# Patient Record
Sex: Male | Born: 1945 | Race: White | Hispanic: No | Marital: Married | Smoking: Current some day smoker
Health system: Southern US, Community
[De-identification: ages and names within clinical notes are randomized; demographics above are authoritative.]

## PROBLEM LIST (undated history)

## (undated) DIAGNOSIS — E119 Type 2 diabetes mellitus without complications: Secondary | ICD-10-CM

## (undated) DIAGNOSIS — Z7982 Long term (current) use of aspirin: Secondary | ICD-10-CM

## (undated) DIAGNOSIS — I38 Endocarditis, valve unspecified: Secondary | ICD-10-CM

## (undated) DIAGNOSIS — M1990 Unspecified osteoarthritis, unspecified site: Secondary | ICD-10-CM

## (undated) DIAGNOSIS — I700 Atherosclerosis of aorta: Secondary | ICD-10-CM

## (undated) DIAGNOSIS — I5020 Unspecified systolic (congestive) heart failure: Secondary | ICD-10-CM

## (undated) DIAGNOSIS — I77810 Thoracic aortic ectasia: Secondary | ICD-10-CM

## (undated) DIAGNOSIS — I509 Heart failure, unspecified: Secondary | ICD-10-CM

## (undated) DIAGNOSIS — K5790 Diverticulosis of intestine, part unspecified, without perforation or abscess without bleeding: Secondary | ICD-10-CM

## (undated) DIAGNOSIS — Z7901 Long term (current) use of anticoagulants: Secondary | ICD-10-CM

## (undated) DIAGNOSIS — I429 Cardiomyopathy, unspecified: Secondary | ICD-10-CM

## (undated) DIAGNOSIS — Z8719 Personal history of other diseases of the digestive system: Secondary | ICD-10-CM

## (undated) DIAGNOSIS — E559 Vitamin D deficiency, unspecified: Secondary | ICD-10-CM

## (undated) DIAGNOSIS — I4891 Unspecified atrial fibrillation: Secondary | ICD-10-CM

## (undated) DIAGNOSIS — I4819 Other persistent atrial fibrillation: Secondary | ICD-10-CM

## (undated) DIAGNOSIS — I6789 Other cerebrovascular disease: Secondary | ICD-10-CM

## (undated) DIAGNOSIS — Z87442 Personal history of urinary calculi: Secondary | ICD-10-CM

## (undated) DIAGNOSIS — I10 Essential (primary) hypertension: Secondary | ICD-10-CM

## (undated) DIAGNOSIS — I639 Cerebral infarction, unspecified: Secondary | ICD-10-CM

## (undated) DIAGNOSIS — I499 Cardiac arrhythmia, unspecified: Secondary | ICD-10-CM

## (undated) DIAGNOSIS — M51379 Other intervertebral disc degeneration, lumbosacral region without mention of lumbar back pain or lower extremity pain: Secondary | ICD-10-CM

## (undated) DIAGNOSIS — J189 Pneumonia, unspecified organism: Secondary | ICD-10-CM

## (undated) HISTORY — DX: Unspecified atrial fibrillation: I48.91

---

## 2009-05-20 ENCOUNTER — Ambulatory Visit: Payer: Self-pay | Admitting: Pain Medicine

## 2009-05-24 ENCOUNTER — Ambulatory Visit: Payer: Self-pay | Admitting: Pain Medicine

## 2011-07-01 ENCOUNTER — Other Ambulatory Visit: Payer: Self-pay | Admitting: Family Medicine

## 2011-07-01 LAB — CK TOTAL AND CKMB (NOT AT ARMC)
CK, Total: 41 U/L (ref 35–232)
CK-MB: 1.1 ng/mL (ref 0.5–3.6)

## 2011-07-01 LAB — TROPONIN I: Troponin-I: <0.02 ng/mL

## 2014-08-27 DIAGNOSIS — I10 Essential (primary) hypertension: Secondary | ICD-10-CM | POA: Insufficient documentation

## 2015-03-10 DIAGNOSIS — I480 Paroxysmal atrial fibrillation: Secondary | ICD-10-CM

## 2015-03-10 HISTORY — DX: Paroxysmal atrial fibrillation: I48.0

## 2016-10-30 DIAGNOSIS — I5022 Chronic systolic (congestive) heart failure: Secondary | ICD-10-CM | POA: Insufficient documentation

## 2018-06-11 DIAGNOSIS — I714 Abdominal aortic aneurysm, without rupture, unspecified: Secondary | ICD-10-CM

## 2018-06-11 HISTORY — DX: Abdominal aortic aneurysm, without rupture, unspecified: I71.40

## 2018-08-26 ENCOUNTER — Other Ambulatory Visit: Payer: Self-pay | Admitting: Family Medicine

## 2018-08-26 DIAGNOSIS — R102 Pelvic and perineal pain: Secondary | ICD-10-CM

## 2018-08-26 DIAGNOSIS — Z87448 Personal history of other diseases of urinary system: Secondary | ICD-10-CM

## 2018-08-28 ENCOUNTER — Ambulatory Visit
Admission: RE | Admit: 2018-08-28 | Discharge: 2018-08-28 | Disposition: A | Discharge status: Home / Self Care | Payer: Medicare Other | Source: Ambulatory Visit | Attending: Family Medicine | Admitting: Family Medicine

## 2018-08-28 ENCOUNTER — Other Ambulatory Visit: Payer: Self-pay

## 2018-08-28 DIAGNOSIS — R102 Pelvic and perineal pain: Secondary | ICD-10-CM | POA: Diagnosis not present

## 2018-08-28 DIAGNOSIS — Z87448 Personal history of other diseases of urinary system: Secondary | ICD-10-CM | POA: Diagnosis present

## 2018-08-28 IMAGING — CT CT ABDOMEN AND PELVIS WITH CONTRAST
2 of 5 series · 16 of 46 positions shown, 18 images · IV contrast (omnipaque)
Comparison: None.

CLINICAL DATA: Left groin pain, pelvic pain.

EXAM:
CT ABDOMEN AND PELVIS WITH CONTRAST
TECHNIQUE: Multidetector CT imaging of the abdomen and pelvis was performed
using the standard protocol following bolus administration of
intravenous contrast.
CONTRAST:  100mL OMNIPAQUE IOHEXOL 300 MG/ML  SOLN

[Series 2: abd pelvis · axial · 0.79mm/px · z∈[-1490,-1110]mm · 13 of 86 slices shown, 15 images]
[im 5/86  soft-tissue]
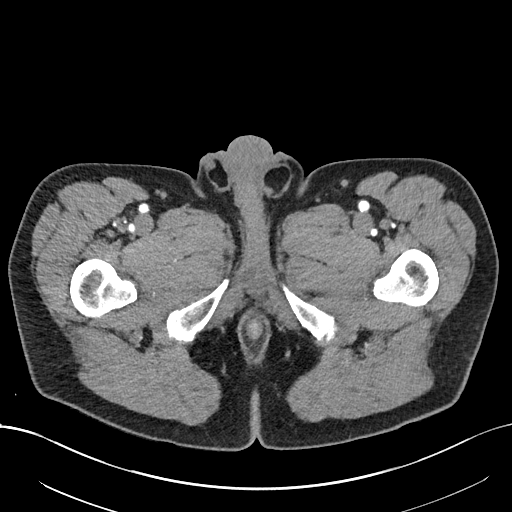
[im 5/86  bone]
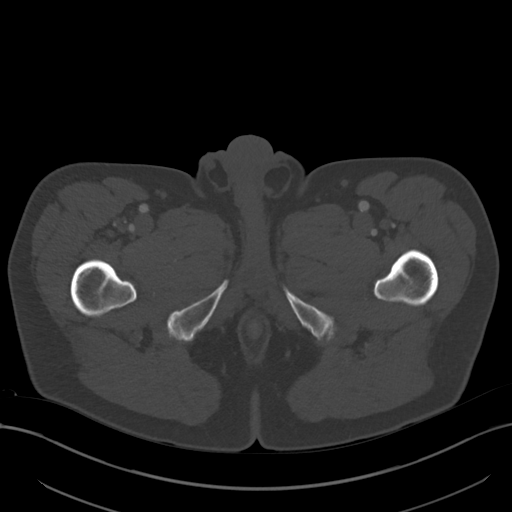
[im 14/86  soft-tissue]
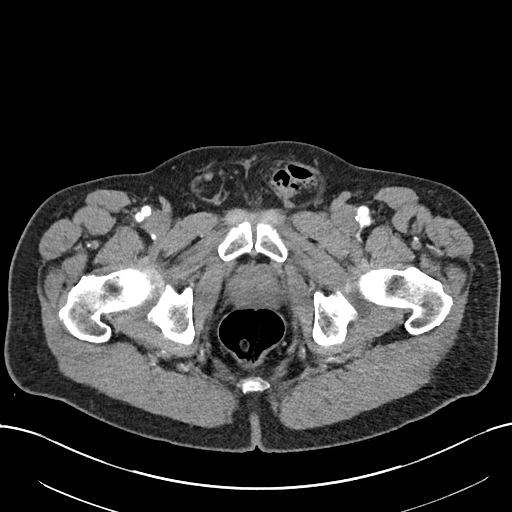
[im 18/86  soft-tissue]
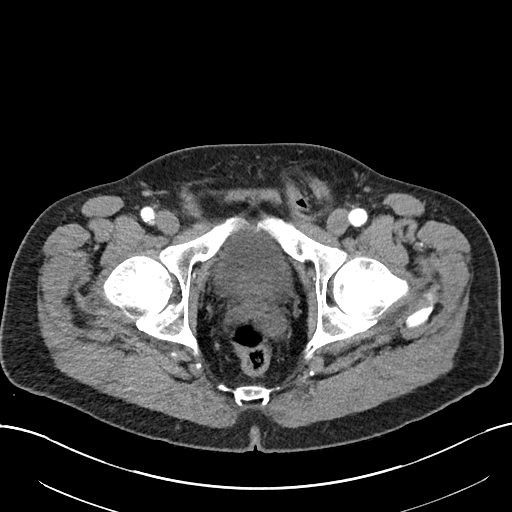
[im 23/86  soft-tissue]
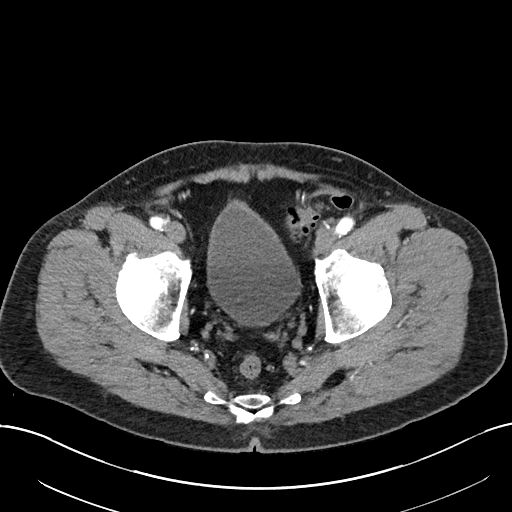
[im 32/86  soft-tissue]
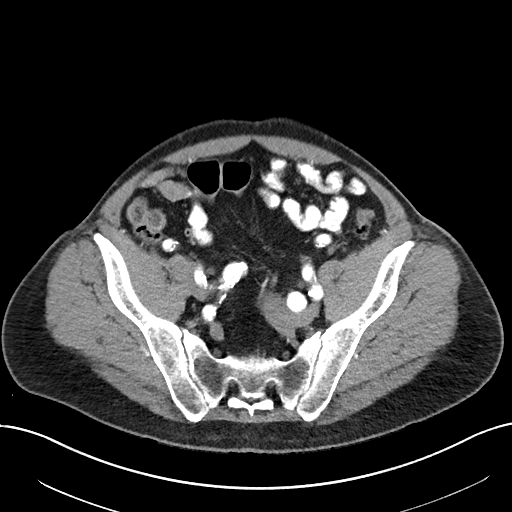
[im 36/86  soft-tissue]
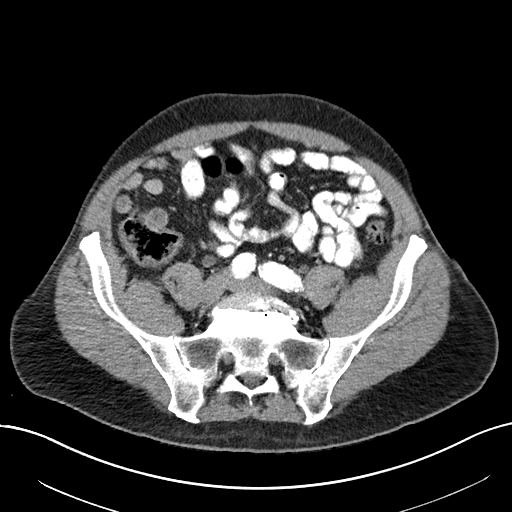
[im 45/86  soft-tissue]
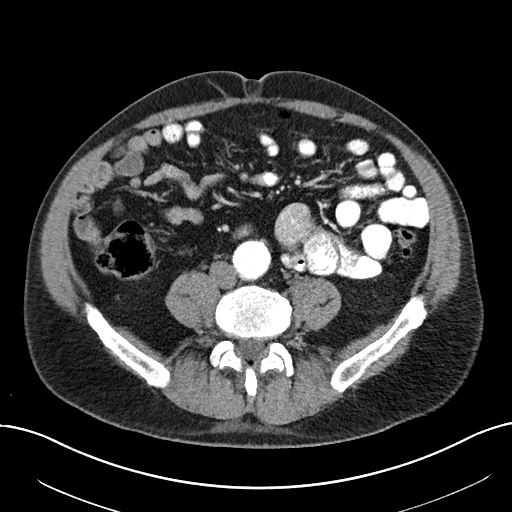
[im 50/86  soft-tissue]
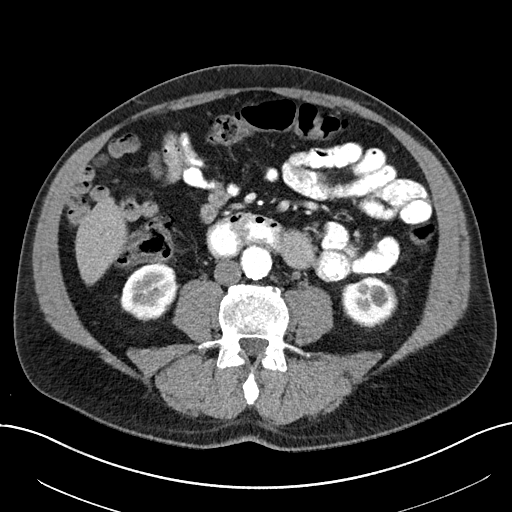
[im 54/86  soft-tissue]
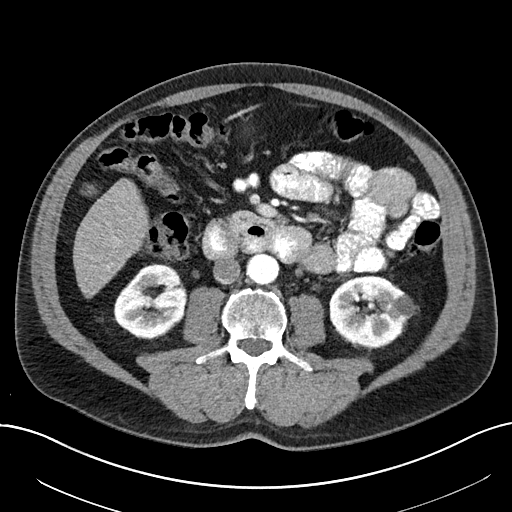
[im 54/86  bone]
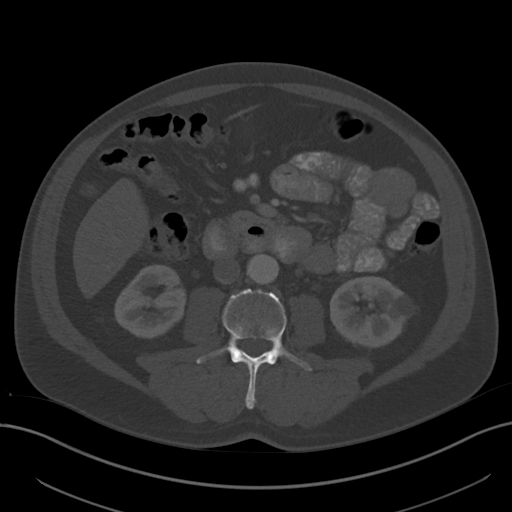
[im 63/86  soft-tissue]
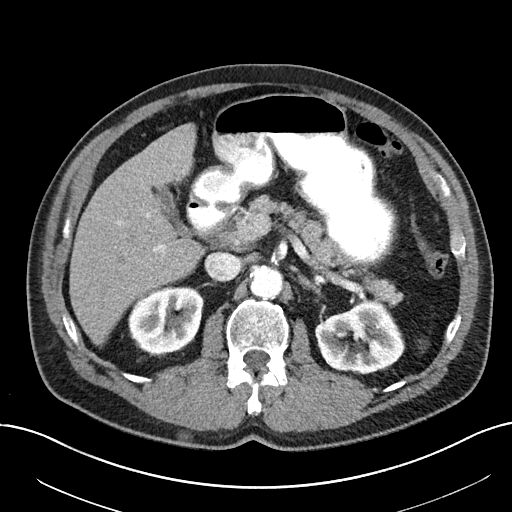
[im 68/86  soft-tissue]
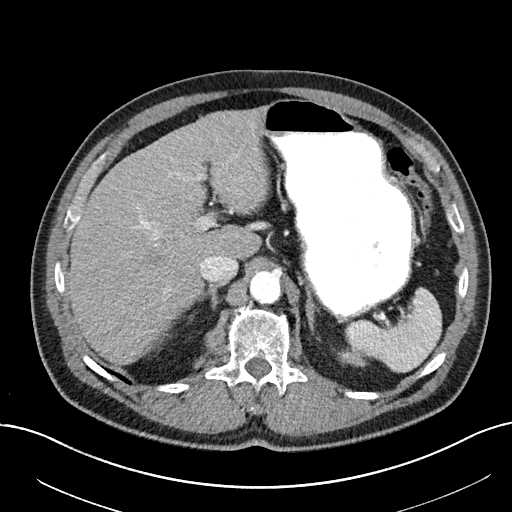
[im 72/86  soft-tissue]
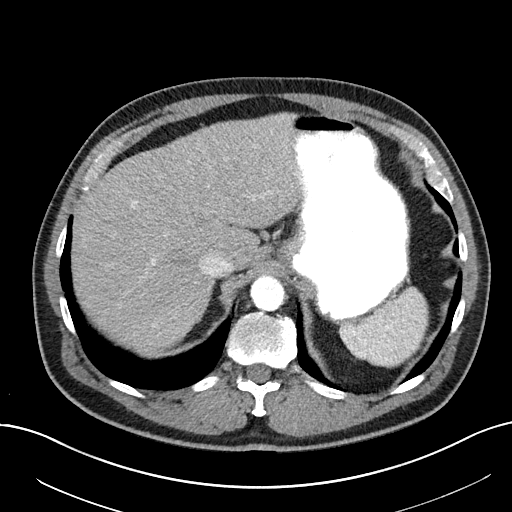
[im 81/86  soft-tissue]
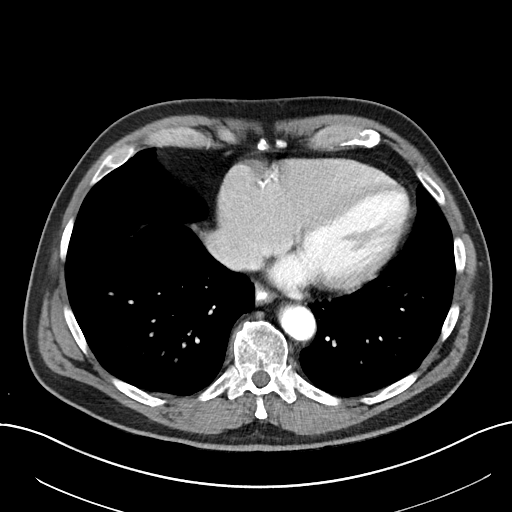

[Series 4: coronal abd pelvis · coronal · 0.79mm/px · 3 of 157 slices shown]
[im 53/157  soft-tissue]
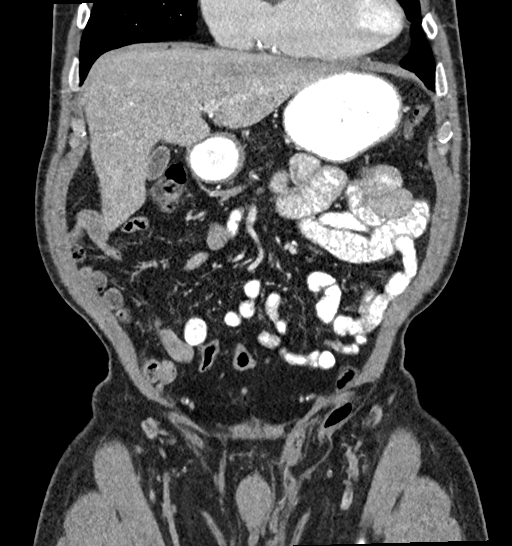
[im 70/157  soft-tissue]
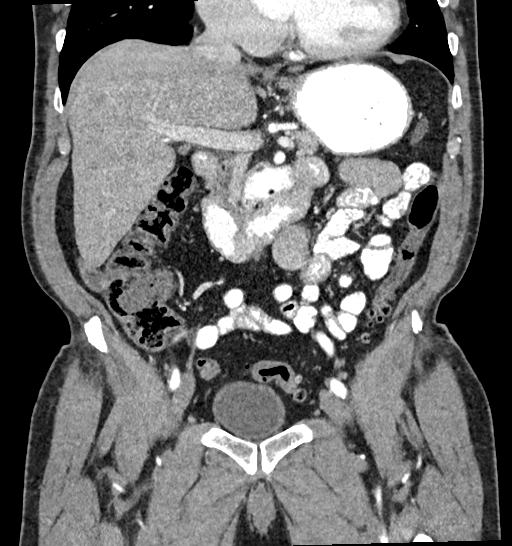
[im 87/157  soft-tissue]
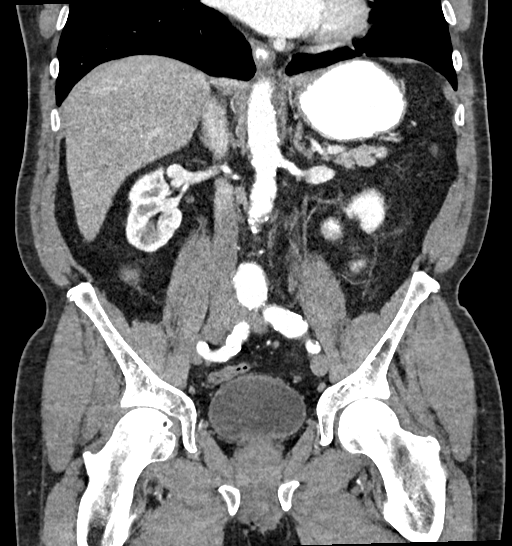

[16 of 46 positions shown; findings below may reference images not displayed]

FINDINGS: Lower chest: Mild cardiomegaly. Densely calcified right coronary
artery. Lung bases clear. No effusions.

Hepatobiliary: Small hypodensity posteriorly in the right hepatic
lobe, likely small cysts. Gallbladder contracted, grossly
unremarkable.

Pancreas: No focal abnormality or ductal dilatation.

Spleen: No focal abnormality.  Normal size.

Adrenals/Urinary Tract: Right lower pole 5 mm nonobstructing stone.
Areas of cortical thinning/scarring in the kidneys bilaterally.
Small left midpole cyst. No hydronephrosis. Multiple distal left
ureteral stones noted. The largest measures 7 x 4 mm at the left
UVJ. Other smaller stones are noted just proximal to this stone.
These are nonobstructing.

Stomach/Bowel: Normal appendix. Sigmoid and descending colonic
diverticulosis. No active diverticulitis. A portion of the sigmoid
colon is within a left inguinal hernia. No obstruction. Stomach and
small bowel unremarkable.

Vascular/Lymphatic: Distal abdominal aortic aneurysm, 3.4 cm.
Diffuse aortoiliac atherosclerosis. No adenopathy.

Reproductive: No visible focal abnormality.

Other: No free fluid or free air.

Musculoskeletal: Degenerative disc disease at L5-S1. Bilateral
degenerative facet disease at this level with grade 1
anterolisthesis of L5 on S1. No acute bony abnormality.
IMPRESSION: Multiple distal left ureteral stones, the largest 7 x 4 mm at the
left UVJ. No hydronephrosis.

Right lower pole nephrolithiasis.

Left colonic diverticulosis.  No diverticulitis.

A loop of sigmoid colon is within a left inguinal hernia. No
evidence of obstruction.

3.4 cm distal abdominal aortic aneurysm. Recommend followup by
ultrasound in 3 years. This recommendation follows ACR consensus
guidelines: White Paper of the ACR Incidental Findings Committee II
aneurysm NOS ([NP]-[NP])

## 2018-08-28 MED ORDER — IOHEXOL 300 MG/ML  SOLN
100.0000 mL | Freq: Once | INTRAMUSCULAR | Status: AC | PRN
  Administered 2018-08-28: 100 mL via INTRAVENOUS

## 2018-09-03 ENCOUNTER — Ambulatory Visit: Payer: Self-pay | Admitting: General Surgery

## 2018-09-03 NOTE — H&P (View-Only) (Signed)
PATIENT PROFILE: Samuel Boyd is a 73 y.o. male who presents to the Clinic for consultation at the request of Dr. Netty Starring for evaluation of left inguinal hernia.  PCP:  Dion Body, MD  HISTORY OF PRESENT ILLNESS: Samuel Boyd reports patient has been complaining with right-sided abdominal and groin pain.  She reports that he went to the primary care physician last week.  PCP ordered a CT scan of the abdomen and pelvis and showed left inguinal hernia and multiple kidney stones on the left side.  Patient reports associated hematuria for 3 days but has not seen any blood in the urine in the last few days.  He reports that the pain is sharp.  Pain does not radiate to other part of the body.  Pain exacerbated with certain movement of the leg.  There is no alleviating factor.  Denies any abdominal distention.  Reports having regular bowel movement.   PROBLEM LIST:         Problem List  Date Reviewed: 08/23/2018         Noted   Abdominal aortic aneurysm (AAA) without rupture (CMS-HCC) 06/11/2018   Medicare annual wellness visit, subsequent 10/26/17 04/27/2017   Vaccine counseling: Tetanus vaccine administered on 10/10/12; Pt declines PCV-13 vaccine (04/27/17) 11/01/2016   Medicare annual wellness visit, initial 11/01/16 8/85/0277   Chronic systolic CHF (congestive heart failure), NYHA class 2 (CMS-HCC) 10/30/2016   Overview    Global ef 40%      Paroxysmal A-fib (CMS-HCC) on Eliquis - followed by Dr. Nehemiah Massed 03/10/2015   Aortic root dilatation (CMS-HCC) 03/10/2015   Overview    4.7cm by echo 2018      Benign essential hypertension 08/27/2014   Borderline diabetes mellitus (A1c 6.2% - 04/26/18) - diet controlled Unknown   Hyperlipemia, mixed (LDL 86 - 04/26/18) Unknown   Tobacco dependence (1/2ppd; started at age 67) Unknown      GENERAL REVIEW OF SYSTEMS:   General ROS: negative for - chills, fatigue, fever, weight gain or weight loss Allergy and Immunology  ROS: negative for - hives  Hematological and Lymphatic ROS: negative for - bleeding problems or bruising, negative for palpable nodes Endocrine ROS: negative for - heat or cold intolerance, hair changes Respiratory ROS: negative for - cough, shortness of breath or wheezing Cardiovascular ROS: no chest pain or palpitations GI ROS: negative for nausea, vomiting, diarrhea, constipation.  Positive for abdominal pain Musculoskeletal ROS: negative for - joint swelling or muscle pain Neurological ROS: negative for - confusion, syncope Dermatological ROS: negative for pruritus and rash Psychiatric: negative for anxiety, depression, difficulty sleeping and memory loss  MEDICATIONS: CurrentMedications        Current Outpatient Medications  Medication Sig Dispense Refill  . cholecalciferol (CHOLECALCIFEROL) 1,000 unit tablet Take 1,000 Units by mouth once daily.    . cyanocobalamin (VITAMIN B12) 1000 MCG tablet Take 1,000 mcg by mouth once daily.    Marland Kitchen docosahexanoic acid/epa (FISH OIL ORAL) Take 1,200 mg by mouth once daily       . ELIQUIS 5 mg tablet TAKE 1 TABLET BY MOUTH TWICE DAILY 180 tablet 1  . lisinopriL (ZESTRIL) 5 MG tablet TAKE 1 TABLET BY MOUTH EVERY DAY 90 tablet 1  . loratadine (CLARITIN) 10 mg tablet Take 10 mg by mouth once daily as needed for Allergies    . metoprolol tartrate (LOPRESSOR) 25 MG tablet Take 25 mg by mouth 2 (two) times daily    . MULTAQ 400 mg tablet TAKE 1 TABLET BY  MOUTH TWICE DAILY WITH MEALS 180 tablet 1  . rosuvastatin (CRESTOR) 10 MG tablet TAKE 1 TABLET BY MOUTH EVERY DAY 90 tablet 3  . tamsulosin (FLOMAX) 0.4 mg capsule Take 1 capsule (0.4 mg total) by mouth once daily Take 30 minutes after same meal each day. 30 capsule 0   No current facility-administered medications for this visit.       ALLERGIES: Patient has no known allergies.  PAST MEDICAL HISTORY:     Past Medical History:  Diagnosis Date  . Abnormal EKG   . Atrial  fibrillation (CMS-HCC)   . Borderline diabetes mellitus   . Cardiomyopathy, secondary (CMS-HCC)   . Hyperlipemia   . Hypertension   . Peripheral vascular disease (CMS-HCC)   . Tobacco abuse   . Valvular heart disease   . Vitamin D deficiency     PAST SURGICAL HISTORY: History reviewed. No pertinent surgical history.   FAMILY HISTORY:      Family History  Problem Relation Age of Onset  . Stroke Mother   . Alzheimer's disease Mother   . Other Father        suicide  . Coronary Artery Disease (Blocked arteries around heart) Brother   . Hyperlipidemia (Elevated cholesterol) Brother   . Stroke Brother      SOCIAL HISTORY: Social History          Socioeconomic History  . Marital status: Married    Spouse name: Not on file  . Number of children: Not on file  . Years of education: Not on file  . Highest education level: Not on file  Occupational History  . Not on file  Social Needs  . Financial resource strain: Not on file  . Food insecurity:    Worry: Not on file    Inability: Not on file  . Transportation needs:    Medical: Not on file    Non-medical: Not on file  Tobacco Use  . Smoking status: Current Every Day Smoker    Packs/day: 0.50    Years: 52.00    Pack years: 26.00    Types: Cigarettes  . Smokeless tobacco: Never Used  Substance and Sexual Activity  . Alcohol use: No    Alcohol/week: 0.0 standard drinks  . Drug use: No  . Sexual activity: Defer  Other Topics Concern  . Not on file  Social History Narrative  . Not on file      PHYSICAL EXAM:    Vitals:   09/03/18 1544  BP: (!) 133/93  Pulse: 94   Body mass index is 28.94 kg/m. Weight: 88.9 kg (196 lb)   GENERAL: Alert, active, oriented x3  HEENT: Pupils equal reactive to light. Extraocular movements are intact. Sclera clear. Palpebral conjunctiva normal red color.Pharynx clear.  NECK: Supple with no palpable mass and no  adenopathy.  LUNGS: Sound clear with no rales rhonchi or wheezes.  HEART: Regular rhythm S1 and S2 without murmur.  ABDOMEN: Soft and depressible, nontender with no palpable mass, no hepatomegaly. Left-sided inguinal hernia.  Reducible but painful upon reduction.  EXTREMITIES: Well-developed well-nourished symmetrical with no dependent edema.  NEUROLOGICAL: Awake alert oriented, facial expression symmetrical, moving all extremities.  REVIEW OF DATA: I have reviewed the following data today:      Office Visit on 08/23/2018  Component Date Value  . Glucose 08/23/2018 111*  . Sodium 08/23/2018 139   . Potassium 08/23/2018 4.6   . Chloride 08/23/2018 106   . Carbon Dioxide (CO2) 08/23/2018 25.3   .  Urea Nitrogen (BUN) 08/23/2018 16   . Creatinine 08/23/2018 1.1   . Glomerular Filtration Ra* 08/23/2018 66   . Calcium 08/23/2018 9.3   . AST  08/23/2018 15   . ALT  08/23/2018 8   . Alk Phos (alkaline Phosp* 08/23/2018 53   . Albumin 08/23/2018 4.1   . Bilirubin, Total 08/23/2018 0.5   . Protein, Total 08/23/2018 6.8   . A/G Ratio 08/23/2018 1.5   . WBC (White Blood Cell Co* 08/23/2018 6.7   . RBC (Red Blood Cell Coun* 08/23/2018 4.88   . Hemoglobin 08/23/2018 16.4   . Hematocrit 08/23/2018 48.7   . MCV (Mean Corpuscular Vo* 08/23/2018 99.8   . MCH (Mean Corpuscular He* 08/23/2018 33.6*  . MCHC (Mean Corpuscular H* 08/23/2018 33.7   . Platelet Count 08/23/2018 181   . RDW-CV (Red Cell Distrib* 08/23/2018 12.7   . MPV (Mean Platelet Volum* 08/23/2018 10.3   . Neutrophils 08/23/2018 3.32   . Lymphocytes 08/23/2018 2.39   . Monocytes 08/23/2018 0.61   . Eosinophils 08/23/2018 0.28   . Basophils 08/23/2018 0.03   . Neutrophil % 08/23/2018 49.9   . Lymphocyte % 08/23/2018 35.9   . Monocyte % 08/23/2018 9.2   . Eosinophil % 08/23/2018 4.2   . Basophil% 08/23/2018 0.5   . Immature Granulocyte % 08/23/2018 0.3   . Immature Granulocyte Cou* 08/23/2018 0.02   . PSA  (Prostate Specific A* 08/23/2018 0.92   . Urine Culture, Routine -* 08/26/2018 Final report   . Result 1 - [facility] 08/26/2018 No growth   . Color 08/26/2018 Yellow   . Clarity 08/26/2018 Clear   . Specific Gravity 08/26/2018 1.010   . pH, Urine 08/26/2018 6.5   . Protein, Urinalysis 08/26/2018 Negative   . Glucose, Urinalysis 08/26/2018 100 *  . Ketones, Urinalysis 08/26/2018 Negative   . Blood, Urinalysis 08/26/2018 Negative   . Nitrite, Urinalysis 08/26/2018 Negative   . Leukocyte Esterase, Urin* 08/26/2018 Negative   . White Blood Cells, Urina* 08/26/2018 0-3   . Red Blood Cells, Urinaly* 08/26/2018 0-3   . Bacteria, Urinalysis 08/26/2018 None Seen   . Squamous Epithelial Cell* 08/26/2018 None Seen      ASSESSMENT: Samuel Boyd is a 73 y.o. male presenting for consultation for left inguinal hernia.    The patient presents with a symptomatic, reducible inguinal hernia.  Patient has large intestine on the hernia as per CT.  I personally reviewed the images.  There is no sign of obstruction.  Patient was oriented about the diagnosis of inguinal hernia and its implication. The patient was oriented about the treatment alternatives (observation vs surgical repair). Due to patient symptoms, repair is recommended. Patient oriented about the surgical procedure, the use of mesh and its risk of complications such as: infection, bleeding, injury to vas deference, vasculature and testicle, injury to bowel or bladder, and chronic pain.  Patient has a history of atrial fibrillation on anticoagulation.  He also has CHF.  He has been evaluated by colonoscopy and found to be stable.  Patient denies any shortness of breath.  Denies chest pain.  Patient able to be active able to do more than 4 METS without chest pain or shortness of breath.  Will consult cardiology for recommendations regarding the management of Eliquis perioperatively.  In the last note it was mentioned about the possibility of changing  medication for heart failure.  Again patient denies any changes in his physical status no worsening chest pain or shortness of breath.  Patient was oriented that he is at increased risk of developing bleeding or delay hematoma due to the Eliquis.  He agreed to proceed despite these risks.  Non-recurrent unilateral inguinal hernia without obstruction or gangrene [K40.90]  PLAN: 1.  Left inguinal hernia repair with mesh (15400) 2. CBC, CMP done 08/23/2018 3. Cardiology clearance for CHF and to hold Eliquis (Dr. Nehemiah Massed) 4. Will call you with recommendations and instruction given by Cardiologist 5. Recommend to proceed with evaluation by Urology for evaluation of multiple kidney stones.  6. Contact us if has any question or concern.   Patient verbalized understanding, all questions were answered, and were agreeable with the plan outlined above.    Herbert Pun, MD  Electronically signed by Herbert Pun, MD

## 2018-09-03 NOTE — H&P (Signed)
PATIENT PROFILE: Samuel Boyd is a 73 y.o. male who presents to the Clinic for consultation at the request of Samuel Boyd for evaluation of left inguinal hernia.  PCP:  Samuel Body, MD  HISTORY OF PRESENT ILLNESS: Samuel Boyd reports patient has been complaining with right-sided abdominal and groin pain.  She reports that he went to the primary care physician last week.  PCP ordered a CT scan of the abdomen and pelvis and showed left inguinal hernia and multiple kidney stones on the left side.  Patient reports associated hematuria for 3 days but has not seen any blood in the urine in the last few days.  He reports that the pain is sharp.  Pain does not radiate to other part of the Boyd.  Pain exacerbated with certain movement of the leg.  There is no alleviating factor.  Denies any abdominal distention.  Reports having regular bowel movement.   PROBLEM LIST:         Problem List  Date Reviewed: 08/23/2018         Noted   Abdominal aortic aneurysm (AAA) without rupture (CMS-HCC) 06/11/2018   Medicare annual wellness visit, subsequent 10/26/17 04/27/2017   Vaccine counseling: Tetanus vaccine administered on 10/10/12; Pt declines PCV-13 vaccine (04/27/17) 11/01/2016   Medicare annual wellness visit, initial 11/01/16 8/85/0277   Chronic systolic CHF (congestive heart failure), NYHA class 2 (CMS-HCC) 10/30/2016   Overview    Global ef 40%      Paroxysmal A-fib (CMS-HCC) on Eliquis - followed by Samuel Boyd 03/10/2015   Aortic root dilatation (CMS-HCC) 03/10/2015   Overview    4.7cm by echo 2018      Benign essential hypertension 08/27/2014   Borderline diabetes mellitus (A1c 6.2% - 04/26/18) - diet controlled Unknown   Hyperlipemia, mixed (LDL 86 - 04/26/18) Unknown   Tobacco dependence (1/2ppd; started at age 67) Unknown      GENERAL REVIEW OF SYSTEMS:   General ROS: negative for - chills, fatigue, fever, weight gain or weight loss Allergy and Immunology  ROS: negative for - hives  Hematological and Lymphatic ROS: negative for - bleeding problems or bruising, negative for palpable nodes Endocrine ROS: negative for - heat or cold intolerance, hair changes Respiratory ROS: negative for - cough, shortness of breath or wheezing Cardiovascular ROS: no chest pain or palpitations GI ROS: negative for nausea, vomiting, diarrhea, constipation.  Positive for abdominal pain Musculoskeletal ROS: negative for - joint swelling or muscle pain Neurological ROS: negative for - confusion, syncope Dermatological ROS: negative for pruritus and rash Psychiatric: negative for anxiety, depression, difficulty sleeping and memory loss  MEDICATIONS: CurrentMedications        Current Outpatient Medications  Medication Sig Dispense Refill  . cholecalciferol (CHOLECALCIFEROL) 1,000 unit tablet Take 1,000 Units by mouth once daily.    . cyanocobalamin (VITAMIN B12) 1000 MCG tablet Take 1,000 mcg by mouth once daily.    Marland Kitchen docosahexanoic acid/epa (FISH OIL ORAL) Take 1,200 mg by mouth once daily       . ELIQUIS 5 mg tablet TAKE 1 TABLET BY MOUTH TWICE DAILY 180 tablet 1  . lisinopriL (ZESTRIL) 5 MG tablet TAKE 1 TABLET BY MOUTH EVERY DAY 90 tablet 1  . loratadine (CLARITIN) 10 mg tablet Take 10 mg by mouth once daily as needed for Allergies    . metoprolol tartrate (LOPRESSOR) 25 MG tablet Take 25 mg by mouth 2 (two) times daily    . MULTAQ 400 mg tablet TAKE 1 TABLET BY  MOUTH TWICE DAILY WITH MEALS 180 tablet 1  . rosuvastatin (CRESTOR) 10 MG tablet TAKE 1 TABLET BY MOUTH EVERY DAY 90 tablet 3  . tamsulosin (FLOMAX) 0.4 mg capsule Take 1 capsule (0.4 mg total) by mouth once daily Take 30 minutes after same meal each day. 30 capsule 0   No current facility-administered medications for this visit.       ALLERGIES: Patient has no known allergies.  PAST MEDICAL HISTORY:     Past Medical History:  Diagnosis Date  . Abnormal EKG   . Atrial  fibrillation (CMS-HCC)   . Borderline diabetes mellitus   . Cardiomyopathy, secondary (CMS-HCC)   . Hyperlipemia   . Hypertension   . Peripheral vascular disease (CMS-HCC)   . Tobacco abuse   . Valvular heart disease   . Vitamin D deficiency     PAST SURGICAL HISTORY: History reviewed. No pertinent surgical history.   FAMILY HISTORY:      Family History  Problem Relation Age of Onset  . Stroke Mother   . Alzheimer's disease Mother   . Other Father        suicide  . Coronary Artery Disease (Blocked arteries around heart) Brother   . Hyperlipidemia (Elevated cholesterol) Brother   . Stroke Brother      SOCIAL HISTORY: Social History          Socioeconomic History  . Marital status: Married    Spouse name: Not on file  . Number of children: Not on file  . Years of education: Not on file  . Highest education level: Not on file  Occupational History  . Not on file  Social Needs  . Financial resource strain: Not on file  . Food insecurity:    Worry: Not on file    Inability: Not on file  . Transportation needs:    Medical: Not on file    Non-medical: Not on file  Tobacco Use  . Smoking status: Current Every Day Smoker    Packs/day: 0.50    Years: 52.00    Pack years: 26.00    Types: Cigarettes  . Smokeless tobacco: Never Used  Substance and Sexual Activity  . Alcohol use: No    Alcohol/week: 0.0 standard drinks  . Drug use: No  . Sexual activity: Defer  Other Topics Concern  . Not on file  Social History Narrative  . Not on file      PHYSICAL EXAM:    Vitals:   09/03/18 1544  BP: (!) 133/93  Pulse: 94   Boyd mass index is 28.94 kg/m. Weight: 88.9 kg (196 lb)   GENERAL: Alert, active, oriented x3  HEENT: Pupils equal reactive to light. Extraocular movements are intact. Sclera clear. Palpebral conjunctiva normal red color.Pharynx clear.  NECK: Supple with no palpable mass and no  adenopathy.  LUNGS: Sound clear with no rales rhonchi or wheezes.  HEART: Regular rhythm S1 and S2 without murmur.  ABDOMEN: Soft and depressible, nontender with no palpable mass, no hepatomegaly. Left-sided inguinal hernia.  Reducible but painful upon reduction.  EXTREMITIES: Well-developed well-nourished symmetrical with no dependent edema.  NEUROLOGICAL: Awake alert oriented, facial expression symmetrical, moving all extremities.  REVIEW OF DATA: I have reviewed the following data today:      Office Visit on 08/23/2018  Component Date Value  . Glucose 08/23/2018 111*  . Sodium 08/23/2018 139   . Potassium 08/23/2018 4.6   . Chloride 08/23/2018 106   . Carbon Dioxide (CO2) 08/23/2018 25.3   .  Urea Nitrogen (BUN) 08/23/2018 16   . Creatinine 08/23/2018 1.1   . Glomerular Filtration Ra* 08/23/2018 66   . Calcium 08/23/2018 9.3   . AST  08/23/2018 15   . ALT  08/23/2018 8   . Alk Phos (alkaline Phosp* 08/23/2018 53   . Albumin 08/23/2018 4.1   . Bilirubin, Total 08/23/2018 0.5   . Protein, Total 08/23/2018 6.8   . A/G Ratio 08/23/2018 1.5   . WBC (White Blood Cell Co* 08/23/2018 6.7   . RBC (Red Blood Cell Coun* 08/23/2018 4.88   . Hemoglobin 08/23/2018 16.4   . Hematocrit 08/23/2018 48.7   . MCV (Mean Corpuscular Vo* 08/23/2018 99.8   . MCH (Mean Corpuscular He* 08/23/2018 33.6*  . MCHC (Mean Corpuscular H* 08/23/2018 33.7   . Platelet Count 08/23/2018 181   . RDW-CV (Red Cell Distrib* 08/23/2018 12.7   . MPV (Mean Platelet Volum* 08/23/2018 10.3   . Neutrophils 08/23/2018 3.32   . Lymphocytes 08/23/2018 2.39   . Monocytes 08/23/2018 0.61   . Eosinophils 08/23/2018 0.28   . Basophils 08/23/2018 0.03   . Neutrophil % 08/23/2018 49.9   . Lymphocyte % 08/23/2018 35.9   . Monocyte % 08/23/2018 9.2   . Eosinophil % 08/23/2018 4.2   . Basophil% 08/23/2018 0.5   . Immature Granulocyte % 08/23/2018 0.3   . Immature Granulocyte Cou* 08/23/2018 0.02   . PSA  (Prostate Specific A* 08/23/2018 0.92   . Urine Culture, Routine -* 08/26/2018 Final report   . Result 1 - [facility] 08/26/2018 No growth   . Color 08/26/2018 Yellow   . Clarity 08/26/2018 Clear   . Specific Gravity 08/26/2018 1.010   . pH, Urine 08/26/2018 6.5   . Protein, Urinalysis 08/26/2018 Negative   . Glucose, Urinalysis 08/26/2018 100 *  . Ketones, Urinalysis 08/26/2018 Negative   . Blood, Urinalysis 08/26/2018 Negative   . Nitrite, Urinalysis 08/26/2018 Negative   . Leukocyte Esterase, Urin* 08/26/2018 Negative   . White Blood Cells, Urina* 08/26/2018 0-3   . Red Blood Cells, Urinaly* 08/26/2018 0-3   . Bacteria, Urinalysis 08/26/2018 None Seen   . Squamous Epithelial Cell* 08/26/2018 None Seen      ASSESSMENT: Mr. Chappuis is a 73 y.o. male presenting for consultation for left inguinal hernia.    The patient presents with a symptomatic, reducible inguinal hernia.  Patient has large intestine on the hernia as per CT.  I personally reviewed the images.  There is no sign of obstruction.  Patient was oriented about the diagnosis of inguinal hernia and its implication. The patient was oriented about the treatment alternatives (observation vs surgical repair). Due to patient symptoms, repair is recommended. Patient oriented about the surgical procedure, the use of mesh and its risk of complications such as: infection, bleeding, injury to vas deference, vasculature and testicle, injury to bowel or bladder, and chronic pain.  Patient has a history of atrial fibrillation on anticoagulation.  He also has CHF.  He has been evaluated by colonoscopy and found to be stable.  Patient denies any shortness of breath.  Denies chest pain.  Patient able to be active able to do more than 4 METS without chest pain or shortness of breath.  Will consult cardiology for recommendations regarding the management of Eliquis perioperatively.  In the last note it was mentioned about the possibility of changing  medication for heart failure.  Again patient denies any changes in his physical status no worsening chest pain or shortness of breath.  Patient was oriented that he is at increased risk of developing bleeding or delay hematoma due to the Eliquis.  He agreed to proceed despite these risks.  Non-recurrent unilateral inguinal hernia without obstruction or gangrene [K40.90]  PLAN: 1.  Left inguinal hernia repair with mesh (15400) 2. CBC, CMP done 08/23/2018 3. Cardiology clearance for CHF and to hold Eliquis (Samuel Boyd) 4. Will call you with recommendations and instruction given by Cardiologist 5. Recommend to proceed with evaluation by Urology for evaluation of multiple kidney stones.  6. Contact us if has any question or concern.   Patient verbalized understanding, all questions were answered, and were agreeable with the plan outlined above.    Herbert Pun, MD  Electronically signed by Herbert Pun, MD

## 2018-09-11 ENCOUNTER — Other Ambulatory Visit: Payer: Self-pay | Admitting: Urology

## 2018-09-11 ENCOUNTER — Encounter: Payer: Self-pay | Admitting: Urology

## 2018-09-11 ENCOUNTER — Ambulatory Visit
Admission: RE | Admit: 2018-09-11 | Discharge: 2018-09-11 | Disposition: A | Discharge status: Home / Self Care | Payer: Medicare Other | Source: Ambulatory Visit | Attending: Urology | Admitting: Urology

## 2018-09-11 ENCOUNTER — Ambulatory Visit (INDEPENDENT_AMBULATORY_CARE_PROVIDER_SITE_OTHER): Payer: Medicare Other | Admitting: Urology

## 2018-09-11 ENCOUNTER — Other Ambulatory Visit: Payer: Self-pay

## 2018-09-11 VITALS — BP 126/78 | Ht 71.0 in | Wt 199.0 lb

## 2018-09-11 DIAGNOSIS — N201 Calculus of ureter: Secondary | ICD-10-CM

## 2018-09-11 DIAGNOSIS — F17200 Nicotine dependence, unspecified, uncomplicated: Secondary | ICD-10-CM | POA: Insufficient documentation

## 2018-09-11 DIAGNOSIS — E782 Mixed hyperlipidemia: Secondary | ICD-10-CM | POA: Insufficient documentation

## 2018-09-11 DIAGNOSIS — K4090 Unilateral inguinal hernia, without obstruction or gangrene, not specified as recurrent: Secondary | ICD-10-CM

## 2018-09-11 DIAGNOSIS — R7303 Prediabetes: Secondary | ICD-10-CM | POA: Insufficient documentation

## 2018-09-11 HISTORY — DX: Mixed hyperlipidemia: E78.2

## 2018-09-11 HISTORY — DX: Nicotine dependence, unspecified, uncomplicated: F17.200

## 2018-09-11 IMAGING — CR ABDOMEN - 1 VIEW
2 series · 2 of 2 positions shown · non-contrast
Comparison: None.

CLINICAL DATA: Follow-up nephrolithiasis

EXAM:
ABDOMEN - 1 VIEW

[abdomen kub (1 of 2)]
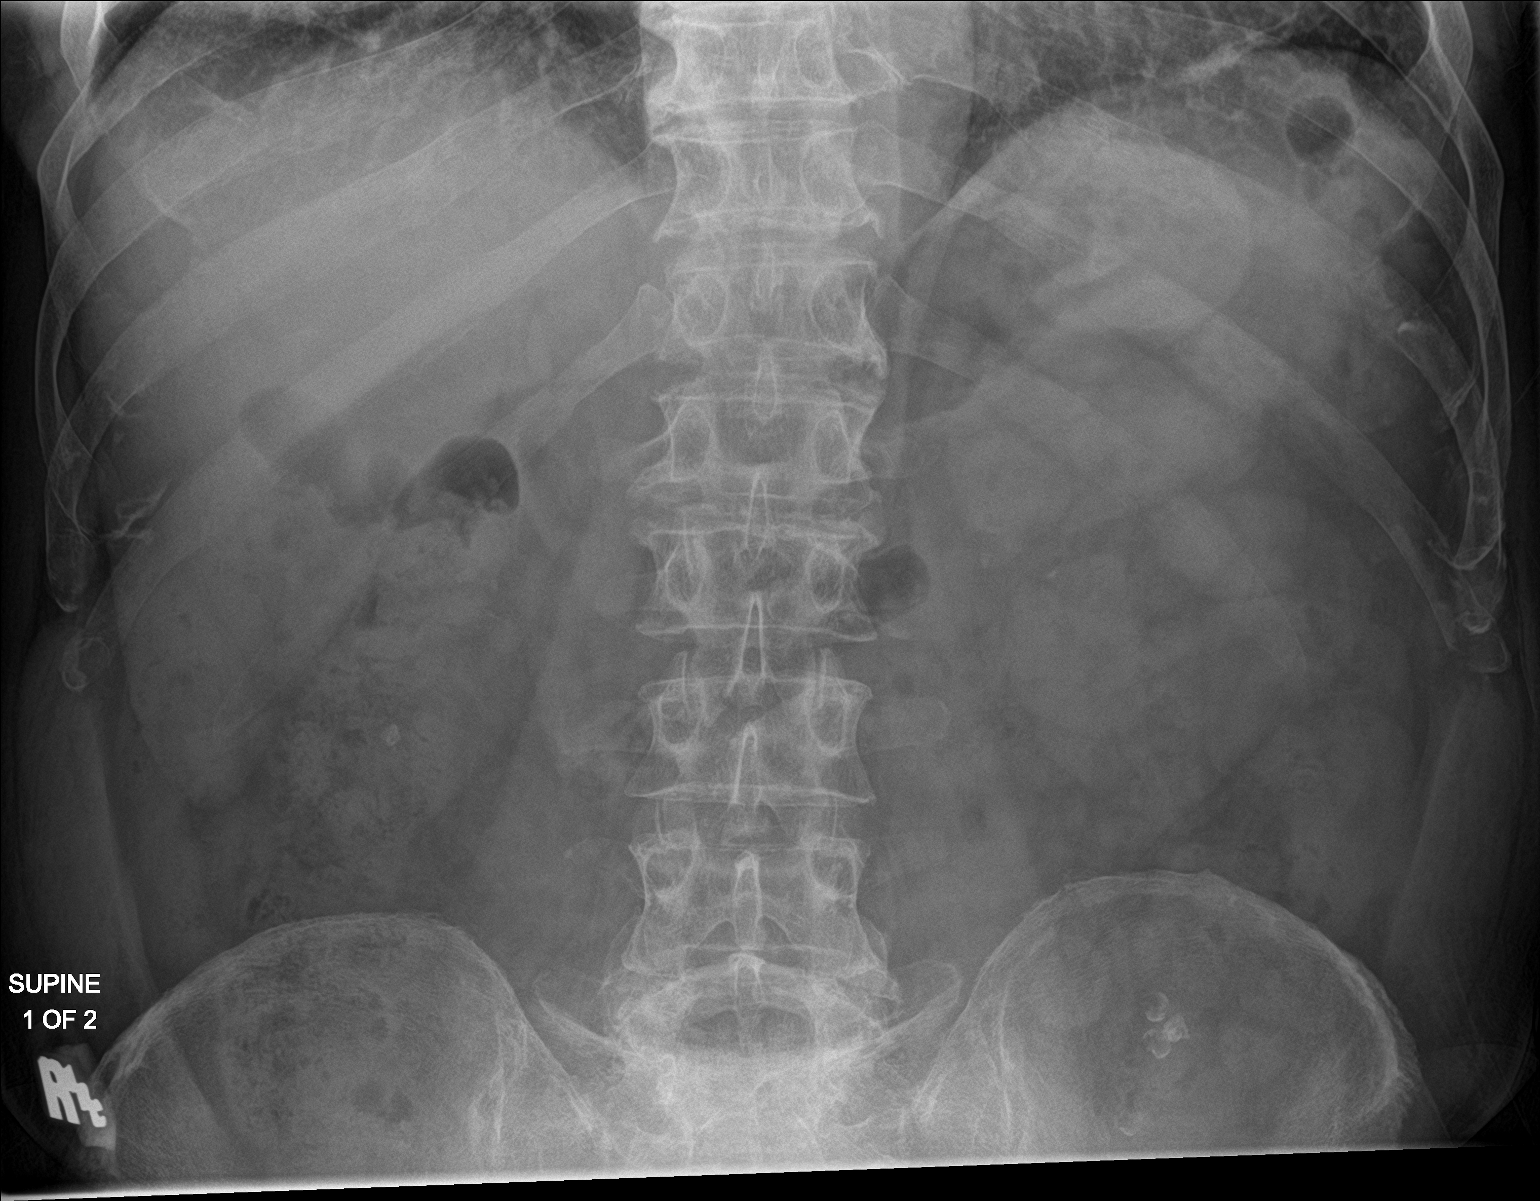

[abdomen kub (2 of 2)]
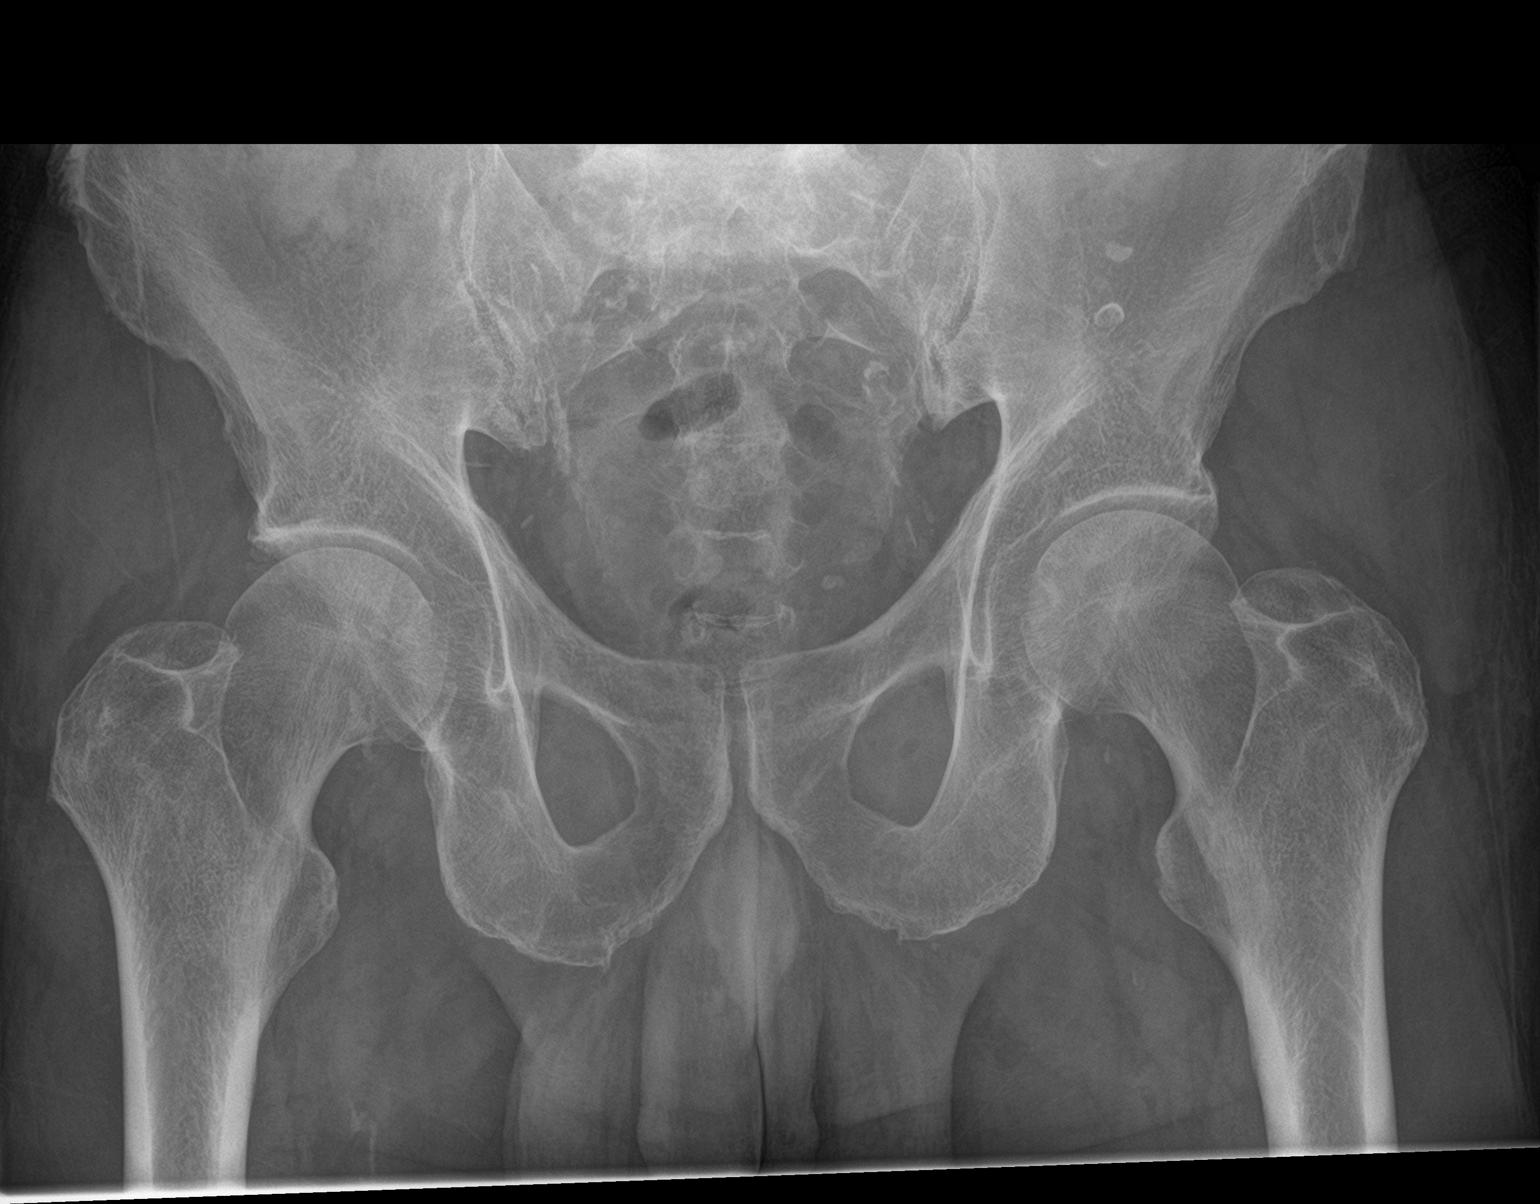

[2 of 2 positions shown; findings below may reference images not displayed]

FINDINGS: Scattered large and small bowel gas is noted. Lower pole right renal
stone is again identified and stable. Vascular calcifications are
noted centrally within the left kidney. Previously seen distal left
ureteral stones are again identified and stable. Diffuse vascular
calcifications are noted. Degenerative changes of lumbar spine are
noted.
IMPRESSION: Lower pole right renal stone and distal left ureteral stones.

## 2018-09-11 NOTE — H&P (View-Only) (Signed)
09/11/2018 7:18 PM   Samuel Boyd January 05, 1946 [id number]  Referring provider: Dion Body, MD [facility] [place],  [place] [address]  Chief Complaint  Patient presents with  . Nephrolithiasis    HPI: 73 year old male who presents today for further evaluation of the left distal ureteral calculi.  He initially complained of gross hematuria and left lower quadrant/flank pain to his primary care physician.  As part of further evaluation for this, he underwent a CT abdomen pelvis with contrast on 08/29/2018 which showed both a left inguinal hernia containing bowel without evidence of strangulation as well as several left distal ureteral calculus largest measuring 7 mm of the left UVJ.  Interesting, there is no hydroureteronephrosis.  He denies personal history of kidney stones.  He is not having a significant flank pain today.  He is not had gross hematuria in over 2 weeks.  Stones are still visible today on KUB.  In the interim, he has been seen and evaluated by Dr. Windell Moment and is scheduled to undergo left inguinal hernia repair this coming Monday.  No significant urinary symptoms other than as per above.  No fevers or chills.  Personal history of A. fib on Eliquis.   PMH: Past Medical History:  Diagnosis Date  . Abnormal EKG   . Atrial fibrillation (CMS-HCC)   . Borderline diabetes mellitus   . Cardiomyopathy, secondary (CMS-HCC)   . Hyperlipemia   . Hypertension   . Peripheral vascular disease (CMS-HCC)   . Tobacco abuse   . Valvular heart disease   . Vitamin D deficiency     Surgical History: No previous surgeries  Home Medications:  Allergies as of 09/11/2018   No Known Allergies     Medication List       Accurate as of September 11, 2018  7:18 PM. If you have any questions, ask your nurse or doctor.        cholecalciferol 25 MCG (1000 UT) tablet Commonly known as: VITAMIN D Take 1,000 Units by mouth  every evening.   dronedarone 400 MG tablet Commonly known as: MULTAQ Take 400 mg by mouth 2 (two) times daily.   Eliquis 5 MG Tabs tablet Generic drug: apixaban Take 5 mg by mouth 2 (two) times daily.   Fish Oil 1200 MG Caps Take 1,200 mg by mouth every evening.   lisinopril 5 MG tablet Commonly known as: ZESTRIL Take 5 mg by mouth every evening.   loratadine 10 MG tablet Commonly known as: CLARITIN Take 10 mg by mouth daily.   metoprolol tartrate 25 MG tablet Commonly known as: LOPRESSOR Take 25 mg by mouth 2 (two) times daily.   rosuvastatin 10 MG tablet Commonly known as: CRESTOR Take 10 mg by mouth daily with supper.   tamsulosin 0.4 MG Caps capsule Commonly known as: FLOMAX Take 0.4 mg by mouth daily after supper. 30 minutes after supper   vitamin B-12 1000 MCG tablet Commonly known as: CYANOCOBALAMIN Take 1,000 mcg by mouth every evening.       Allergies: No Known Allergies  Family History: Family History  Problem Relation Age of Onset  . Stroke Mother   . Alzheimer's disease Mother   . Other Father    suicide  . Coronary Artery Disease (Blocked arteries around heart) Brother   . Hyperlipidemia (Elevated cholesterol) Brother      Social History:  reports that he has never smoked. He has never used smokeless tobacco. No history on file for  alcohol and drug.  ROS: UROLOGY Frequent Urination?: No Hard to postpone urination?: No Burning/pain with urination?: No Get up at night to urinate?: Yes Leakage of urine?: No Urine stream starts and stops?: No Trouble starting stream?: No Do you have to strain to urinate?: No Blood in urine?: No Urinary tract infection?: No Sexually transmitted disease?: No Injury to kidneys or bladder?: No Painful intercourse?: No Weak stream?: No Erection problems?: No Penile pain?: No  Gastrointestinal Nausea?: No Vomiting?: No Indigestion/heartburn?: No Diarrhea?: No Constipation?: No   Constitutional Fever: No Night sweats?: No Weight loss?: No Fatigue?: No  Skin Skin rash/lesions?: No Itching?: No  Eyes Blurred vision?: No Double vision?: No  Ears/Nose/Throat Sore throat?: No Sinus problems?: No  Hematologic/Lymphatic Swollen glands?: No Easy bruising?: No  Cardiovascular Leg swelling?: No Chest pain?: No  Respiratory Cough?: No Shortness of breath?: No  Endocrine Excessive thirst?: No  Musculoskeletal Back pain?: No Joint pain?: No  Neurological Headaches?: No Dizziness?: No  Psychologic Depression?: No Anxiety?: No  Physical Exam: BP 126/78   Ht 5\' 11"  (1.803 m)   Wt 199 lb (90.3 kg)   BMI 27.75 kg/m   Constitutional:  Alert and oriented, No acute distress. HEENT: [place] AT, moist mucus membranes.  Trachea midline, no masses. Cardiovascular: No clubbing, cyanosis, or edema. Respiratory: Normal respiratory effort, no increased work of breathing. GI: Abdomen is soft, nontender, nondistended, no abdominal masses GU: No CVA tenderness Skin: No rashes, bruises or suspicious lesions. Neurologic: Grossly intact, no focal deficits, moving all 4 extremities. Psychiatric: Normal mood and affect.  Laboratory Data: Creatinine 1.17/20  Urinalysis UA + trace blood  Pertinent Imaging: CLINICAL DATA:  Left groin pain, pelvic pain.  EXAM: CT ABDOMEN AND PELVIS WITH CONTRAST  TECHNIQUE: Multidetector CT imaging of the abdomen and pelvis was performed using the standard protocol following bolus administration of intravenous contrast.  CONTRAST:  131mL OMNIPAQUE IOHEXOL 300 MG/ML  SOLN  COMPARISON:  None.  FINDINGS: Lower chest: Mild cardiomegaly. Densely calcified right coronary artery. Lung bases clear. No effusions.  Hepatobiliary: Small hypodensity posteriorly in the right hepatic lobe, likely small cysts. Gallbladder contracted, grossly unremarkable.  Pancreas: No focal abnormality or ductal dilatation.  Spleen:  No focal abnormality.  Normal size.  Adrenals/Urinary Tract: Right lower pole 5 mm nonobstructing stone. Areas of cortical thinning/scarring in the kidneys bilaterally. Small left midpole cyst. No hydronephrosis. Multiple distal left ureteral stones noted. The largest measures 7 x 4 mm at the left UVJ. Other smaller stones are noted just proximal to this stone. These are nonobstructing.  Stomach/Bowel: Normal appendix. Sigmoid and descending colonic diverticulosis. No active diverticulitis. A portion of the sigmoid colon is within a left inguinal hernia. No obstruction. Stomach and small bowel unremarkable.  Vascular/Lymphatic: Distal abdominal aortic aneurysm, 3.4 cm. Diffuse aortoiliac atherosclerosis. No adenopathy.  Reproductive: No visible focal abnormality.  Other: No free fluid or free air.  Musculoskeletal: Degenerative disc disease at L5-S1. Bilateral degenerative facet disease at this level with grade 1 anterolisthesis of L5 on S1. No acute bony abnormality.  IMPRESSION: Multiple distal left ureteral stones, the largest 7 x 4 mm at the left UVJ. No hydronephrosis.  Right lower pole nephrolithiasis.  Left colonic diverticulosis.  No diverticulitis.  A loop of sigmoid colon is within a left inguinal hernia. No evidence of obstruction.  3.4 cm distal abdominal aortic aneurysm. Recommend followup by ultrasound in 3 years. This recommendation follows ACR consensus guidelines: White Paper of the ACR Incidental Findings Committee II on  Vascular Findings. J Am Coll Radiol 2013; 10:789-794. Aortic aneurysm NOS (ICD10-I71.9)   Electronically Signed   By: Rolm Baptise M.D.   On: 08/29/2018 08:52   CT scan personally reviewed, agree with interpretation.    KUB imaging was also personally reviewed.  L UVJ stone present and unchanged as above.  Assessment & Plan:    1. Left ureteral stone Several left distal ureteral stones measuring up to 7 mm in size  without significant obstruction  He does have some left lower quadrant pain and intermittent hematuria likely related to the stones versus inguinal hernia  KUB today shows persistent stones  Given the patient will be proceeding with inguinal hernia repair on Monday, have strongly recommend that he consider the option of cystoscopy, ureteroscopy with laser lithotripsy and stent placement in order to avoid a second anesthetic.  He will be stopping his Eliquis which he is cardiac clearance which is also a favorable time for him to have intervention for the stones.  Given the number of stones, I suspect that he will not likely passed them spontaneously.  Alternatives including ESWL were also discussed.  Medical expulsive therapy was also discussed as an option.  Risks and benefits of ureteroscopy were reviewed including but not limited to infection, bleeding, pain, ureteral injury which could require open surgery versus prolonged indwelling if ureteral perforation occurs, persistent stone disease, requirement for staged procedure, possible stent, and global anesthesia risks. Patient expressed understanding and desires to proceed with ureteroscopy.   After lengthy discussion, he is interested in pursuing ureteroscopy at the same time as inguinal hernia repair.  I called Dr. Windell Moment who is agreeable with this plan.  We will plan to move on to first case on Monday morning to accommodate both of our schedules.  Preop UA urine culture  - Urinalysis, Complete - CULTURE, URINE COMPREHENSIVE  2. Left inguinal hernia As above   Hollice Espy, MD  [facility] [address], [place], [place] [address] [phone]

## 2018-09-11 NOTE — Progress Notes (Signed)
09/11/2018 7:18 PM   Valma Cava Alba [place] [birth year] [id number]  Referring provider: Dion Body, MD [facility] [address] [place],  [place] [address]  Chief Complaint  Patient presents with  . Nephrolithiasis    HPI: 73 year old male who presents today for further evaluation of the left distal ureteral calculi.  He initially complained of gross hematuria and left lower quadrant/flank pain to his primary care physician.  As part of further evaluation for this, he underwent a CT abdomen pelvis with contrast on 08/29/2018 which showed both a left inguinal hernia containing bowel without evidence of strangulation as well as several left distal ureteral calculus largest measuring 7 mm of the left UVJ.  Interesting, there is no hydroureteronephrosis.  He denies personal history of kidney stones.  He is not having a significant flank pain today.  He is not had gross hematuria in over 2 weeks.  Stones are still visible today on KUB.  In the interim, he has been seen and evaluated by Dr. Windell Moment and is scheduled to undergo left inguinal hernia repair this coming Monday.  No significant urinary symptoms other than as per above.  No fevers or chills.  Personal history of A. fib on Eliquis.   PMH: Past Medical History:  Diagnosis Date  . Abnormal EKG   . Atrial fibrillation (CMS-HCC)   . Borderline diabetes mellitus   . Cardiomyopathy, secondary (CMS-HCC)   . Hyperlipemia   . Hypertension   . Peripheral vascular disease (CMS-HCC)   . Tobacco abuse   . Valvular heart disease   . Vitamin D deficiency     Surgical History: No previous surgeries  Home Medications:  Allergies as of 09/11/2018   No Known Allergies     Medication List       Accurate as of September 11, 2018  7:18 PM. If you have any questions, ask your nurse or doctor.        cholecalciferol 25 MCG (1000 UT) tablet Commonly known as: VITAMIN D Take 1,000 Units by mouth  every evening.   dronedarone 400 MG tablet Commonly known as: MULTAQ Take 400 mg by mouth 2 (two) times daily.   Eliquis 5 MG Tabs tablet Generic drug: apixaban Take 5 mg by mouth 2 (two) times daily.   Fish Oil 1200 MG Caps Take 1,200 mg by mouth every evening.   lisinopril 5 MG tablet Commonly known as: ZESTRIL Take 5 mg by mouth every evening.   loratadine 10 MG tablet Commonly known as: CLARITIN Take 10 mg by mouth daily.   metoprolol tartrate 25 MG tablet Commonly known as: LOPRESSOR Take 25 mg by mouth 2 (two) times daily.   rosuvastatin 10 MG tablet Commonly known as: CRESTOR Take 10 mg by mouth daily with supper.   tamsulosin 0.4 MG Caps capsule Commonly known as: FLOMAX Take 0.4 mg by mouth daily after supper. 30 minutes after supper   vitamin B-12 1000 MCG tablet Commonly known as: CYANOCOBALAMIN Take 1,000 mcg by mouth every evening.       Allergies: No Known Allergies  Family History: Family History  Problem Relation Age of Onset  . Stroke Mother   . Alzheimer's disease Mother   . Other Father    suicide  . Coronary Artery Disease (Blocked arteries around heart) Brother   . Hyperlipidemia (Elevated cholesterol) Brother      Social History:  reports that he has never smoked. He has never used smokeless tobacco. No history on file for  alcohol and drug.  ROS: UROLOGY Frequent Urination?: No Hard to postpone urination?: No Burning/pain with urination?: No Get up at night to urinate?: Yes Leakage of urine?: No Urine stream starts and stops?: No Trouble starting stream?: No Do you have to strain to urinate?: No Blood in urine?: No Urinary tract infection?: No Sexually transmitted disease?: No Injury to kidneys or bladder?: No Painful intercourse?: No Weak stream?: No Erection problems?: No Penile pain?: No  Gastrointestinal Nausea?: No Vomiting?: No Indigestion/heartburn?: No Diarrhea?: No Constipation?: No   Constitutional Fever: No Night sweats?: No Weight loss?: No Fatigue?: No  Skin Skin rash/lesions?: No Itching?: No  Eyes Blurred vision?: No Double vision?: No  Ears/Nose/Throat Sore throat?: No Sinus problems?: No  Hematologic/Lymphatic Swollen glands?: No Easy bruising?: No  Cardiovascular Leg swelling?: No Chest pain?: No  Respiratory Cough?: No Shortness of breath?: No  Endocrine Excessive thirst?: No  Musculoskeletal Back pain?: No Joint pain?: No  Neurological Headaches?: No Dizziness?: No  Psychologic Depression?: No Anxiety?: No  Physical Exam: BP 126/78   Ht 5\' 11"  (1.803 m)   Wt 199 lb (90.3 kg)   BMI 27.75 kg/m   Constitutional:  Alert and oriented, No acute distress. HEENT: [place] AT, moist mucus membranes.  Trachea midline, no masses. Cardiovascular: No clubbing, cyanosis, or edema. Respiratory: Normal respiratory effort, no increased work of breathing. GI: Abdomen is soft, nontender, nondistended, no abdominal masses GU: No CVA tenderness Skin: No rashes, bruises or suspicious lesions. Neurologic: Grossly intact, no focal deficits, moving all 4 extremities. Psychiatric: Normal mood and affect.  Laboratory Data: Creatinine 1.17/20  Urinalysis UA + trace blood  Pertinent Imaging: CLINICAL DATA:  Left groin pain, pelvic pain.  EXAM: CT ABDOMEN AND PELVIS WITH CONTRAST  TECHNIQUE: Multidetector CT imaging of the abdomen and pelvis was performed using the standard protocol following bolus administration of intravenous contrast.  CONTRAST:  141mL OMNIPAQUE IOHEXOL 300 MG/ML  SOLN  COMPARISON:  None.  FINDINGS: Lower chest: Mild cardiomegaly. Densely calcified right coronary artery. Lung bases clear. No effusions.  Hepatobiliary: Small hypodensity posteriorly in the right hepatic lobe, likely small cysts. Gallbladder contracted, grossly unremarkable.  Pancreas: No focal abnormality or ductal dilatation.  Spleen:  No focal abnormality.  Normal size.  Adrenals/Urinary Tract: Right lower pole 5 mm nonobstructing stone. Areas of cortical thinning/scarring in the kidneys bilaterally. Small left midpole cyst. No hydronephrosis. Multiple distal left ureteral stones noted. The largest measures 7 x 4 mm at the left UVJ. Other smaller stones are noted just proximal to this stone. These are nonobstructing.  Stomach/Bowel: Normal appendix. Sigmoid and descending colonic diverticulosis. No active diverticulitis. A portion of the sigmoid colon is within a left inguinal hernia. No obstruction. Stomach and small bowel unremarkable.  Vascular/Lymphatic: Distal abdominal aortic aneurysm, 3.4 cm. Diffuse aortoiliac atherosclerosis. No adenopathy.  Reproductive: No visible focal abnormality.  Other: No free fluid or free air.  Musculoskeletal: Degenerative disc disease at L5-S1. Bilateral degenerative facet disease at this level with grade 1 anterolisthesis of L5 on S1. No acute bony abnormality.  IMPRESSION: Multiple distal left ureteral stones, the largest 7 x 4 mm at the left UVJ. No hydronephrosis.  Right lower pole nephrolithiasis.  Left colonic diverticulosis.  No diverticulitis.  A loop of sigmoid colon is within a left inguinal hernia. No evidence of obstruction.  3.4 cm distal abdominal aortic aneurysm. Recommend followup by ultrasound in 3 years. This recommendation follows ACR consensus guidelines: White Paper of the ACR Incidental Findings Committee II on  Vascular Findings. J Am Coll Radiol 2013; 10:789-794. Aortic aneurysm NOS (ICD10-I71.9)   Electronically Signed   By: Rolm Baptise M.D.   On: 08/29/2018 08:52   CT scan personally reviewed, agree with interpretation.    KUB imaging was also personally reviewed.  L UVJ stone present and unchanged as above.  Assessment & Plan:    1. Left ureteral stone Several left distal ureteral stones measuring up to 7 mm in size  without significant obstruction  He does have some left lower quadrant pain and intermittent hematuria likely related to the stones versus inguinal hernia  KUB today shows persistent stones  Given the patient will be proceeding with inguinal hernia repair on Monday, have strongly recommend that he consider the option of cystoscopy, ureteroscopy with laser lithotripsy and stent placement in order to avoid a second anesthetic.  He will be stopping his Eliquis which he is cardiac clearance which is also a favorable time for him to have intervention for the stones.  Given the number of stones, I suspect that he will not likely passed them spontaneously.  Alternatives including ESWL were also discussed.  Medical expulsive therapy was also discussed as an option.  Risks and benefits of ureteroscopy were reviewed including but not limited to infection, bleeding, pain, ureteral injury which could require open surgery versus prolonged indwelling if ureteral perforation occurs, persistent stone disease, requirement for staged procedure, possible stent, and global anesthesia risks. Patient expressed understanding and desires to proceed with ureteroscopy.   After lengthy discussion, he is interested in pursuing ureteroscopy at the same time as inguinal hernia repair.  I called Dr. Windell Moment who is agreeable with this plan.  We will plan to move on to first case on Monday morning to accommodate both of our schedules.  Preop UA urine culture  - Urinalysis, Complete - CULTURE, URINE COMPREHENSIVE  2. Left inguinal hernia As above   Hollice Espy, MD  [facility] [address], [place], [place] [address] [phone]

## 2018-09-12 ENCOUNTER — Other Ambulatory Visit: Payer: Self-pay

## 2018-09-12 ENCOUNTER — Other Ambulatory Visit: Payer: Self-pay | Admitting: Radiology

## 2018-09-12 ENCOUNTER — Other Ambulatory Visit
Admission: RE | Admit: 2018-09-12 | Discharge: 2018-09-12 | Disposition: A | Discharge status: Home / Self Care | Payer: Medicare Other | Source: Ambulatory Visit | Attending: General Surgery | Admitting: General Surgery

## 2018-09-12 DIAGNOSIS — N201 Calculus of ureter: Secondary | ICD-10-CM | POA: Insufficient documentation

## 2018-09-12 DIAGNOSIS — Z01812 Encounter for preprocedural laboratory examination: Secondary | ICD-10-CM | POA: Insufficient documentation

## 2018-09-12 DIAGNOSIS — K4040 Unilateral inguinal hernia, with gangrene, not specified as recurrent: Secondary | ICD-10-CM | POA: Diagnosis not present

## 2018-09-12 DIAGNOSIS — Z20828 Contact with and (suspected) exposure to other viral communicable diseases: Secondary | ICD-10-CM | POA: Diagnosis not present

## 2018-09-12 HISTORY — DX: Pneumonia, unspecified organism: J18.9

## 2018-09-12 HISTORY — DX: Unspecified osteoarthritis, unspecified site: M19.90

## 2018-09-12 HISTORY — DX: Essential (primary) hypertension: I10

## 2018-09-12 HISTORY — DX: Cardiac arrhythmia, unspecified: I49.9

## 2018-09-12 HISTORY — DX: Personal history of urinary calculi: Z87.442

## 2018-09-12 LAB — URINALYSIS, COMPLETE
Bilirubin, UA: NEGATIVE
Glucose, UA: NEGATIVE
Ketones, UA: NEGATIVE
Leukocytes,UA: NEGATIVE
Nitrite, UA: NEGATIVE
Protein,UA: NEGATIVE
Specific Gravity, UA: 1.015 (ref 1.005–1.030)
Urobilinogen, Ur: 0.2 mg/dL (ref 0.2–1.0)
pH, UA: 5 (ref 5.0–7.5)

## 2018-09-12 LAB — MICROSCOPIC EXAMINATION
Bacteria, UA: NONE SEEN
RBC, Urine: NONE SEEN /hpf (ref 0–2)

## 2018-09-12 NOTE — Patient Instructions (Signed)
Your procedure is scheduled on: Mon 8/10 Report to day surgery    [facility]. To find out your arrival time please call [phone] between 1PM - 3PM on Friday 8/7.  Remember: Instructions that are not followed completely may result in serious medical risk,  up to and including death, or upon the discretion of your surgeon and anesthesiologist your  surgery may need to be rescheduled.     _X__ 1. Do not eat food after midnight the night before your procedure.                 No gum chewing or hard candies. You may drink clear liquids up to 2 hours                 before you are scheduled to arrive for your surgery- DO not drink clear                 liquids within 2 hours of the start of your surgery.                 Clear Liquids include:  water, apple juice without pulp, clear carbohydrate                 drink such as Clearfast of Gatorade, Black Coffee or Tea (Do not add                 anything to coffee or tea).  __X__2.  On the morning of surgery brush your teeth with toothpaste and water, you                may rinse your mouth with mouthwash if you wish.  Do not swallow any toothpaste of mouthwash.     _X__ 3.  No Alcohol for 24 hours before or after surgery.   _X__ 4.  Do Not Smoke or use e-cigarettes For 24 Hours Prior to Your Surgery.                 Do not use any chewable tobacco products for at least 6 hours prior to                 surgery.  ____  5.  Bring all medications with you on the day of surgery if instructed.   __x__  6.  Notify your doctor if there is any change in your medical condition      (cold, fever, infections).     Do not wear jewelry, make-up, hairpins, clips or nail polish. Do not wear lotions, powders, or perfumes. You may wear deodorant. Do not shave 48 hours prior to surgery. Men may shave face and neck. Do not bring valuables to the hospital.    [facility] is not responsible for any belongings or  valuables.  Contacts, dentures or bridgework may not be worn into surgery. Leave your suitcase in the car. After surgery it may be brought to your room. For patients admitted to the hospital, discharge time is determined by your treatment team.   Patients discharged the day of surgery will not be allowed to drive home.   Please read over the following fact sheets that you were given:    __x__ Take these medicines the morning of surgery with A SIP OF WATER:    1. dronedarone (MULTAQ) 400 MG tablet  2. metoprolol tartrate (LOPRESSOR) 25 MG tablet  3.   4.  5.  6.  ____ Fleet Enema (as directed)  _x___ Use CHG Soap as directed  ____ Use inhalers on the day of surgery  ____ Stop metformin 2 days prior to surgery    ____ Take 1/2 of usual insulin dose the night before surgery. No insulin the morning          of surgery.   __x__ Stopped Eliquis  yesterday  ____ Stop Anti-inflammatories on    __x__ Stop supplements until after surgery.   Omega-3 Fatty Acids (FISH OIL) 1200 MG CAPS  today ____ Bring C-Pap to the hospital.

## 2018-09-13 LAB — SARS CORONAVIRUS 2 (TAT 6-24 HRS): SARS Coronavirus 2: NEGATIVE

## 2018-09-14 LAB — CULTURE, URINE COMPREHENSIVE

## 2018-09-16 ENCOUNTER — Ambulatory Visit: Payer: Medicare Other | Admitting: Anesthesiology

## 2018-09-16 ENCOUNTER — Ambulatory Visit: Payer: Medicare Other

## 2018-09-16 ENCOUNTER — Other Ambulatory Visit: Payer: Self-pay

## 2018-09-16 ENCOUNTER — Ambulatory Visit
Admission: RE | Admit: 2018-09-16 | Discharge: 2018-09-16 | Disposition: A | Discharge status: Home / Self Care | Payer: Medicare Other | Attending: General Surgery | Admitting: General Surgery

## 2018-09-16 ENCOUNTER — Encounter
Admission: RE | Disposition: A | Discharge status: Home / Self Care | Payer: Self-pay | Source: Home / Self Care | Attending: General Surgery

## 2018-09-16 DIAGNOSIS — I739 Peripheral vascular disease, unspecified: Secondary | ICD-10-CM | POA: Diagnosis not present

## 2018-09-16 DIAGNOSIS — N201 Calculus of ureter: Secondary | ICD-10-CM | POA: Insufficient documentation

## 2018-09-16 DIAGNOSIS — E559 Vitamin D deficiency, unspecified: Secondary | ICD-10-CM | POA: Insufficient documentation

## 2018-09-16 DIAGNOSIS — D176 Benign lipomatous neoplasm of spermatic cord: Secondary | ICD-10-CM | POA: Insufficient documentation

## 2018-09-16 DIAGNOSIS — I480 Paroxysmal atrial fibrillation: Secondary | ICD-10-CM | POA: Insufficient documentation

## 2018-09-16 DIAGNOSIS — E782 Mixed hyperlipidemia: Secondary | ICD-10-CM | POA: Insufficient documentation

## 2018-09-16 DIAGNOSIS — I110 Hypertensive heart disease with heart failure: Secondary | ICD-10-CM | POA: Insufficient documentation

## 2018-09-16 DIAGNOSIS — K4090 Unilateral inguinal hernia, without obstruction or gangrene, not specified as recurrent: Secondary | ICD-10-CM | POA: Insufficient documentation

## 2018-09-16 DIAGNOSIS — Z79899 Other long term (current) drug therapy: Secondary | ICD-10-CM | POA: Diagnosis not present

## 2018-09-16 DIAGNOSIS — Z7901 Long term (current) use of anticoagulants: Secondary | ICD-10-CM | POA: Diagnosis not present

## 2018-09-16 DIAGNOSIS — I5022 Chronic systolic (congestive) heart failure: Secondary | ICD-10-CM | POA: Diagnosis not present

## 2018-09-16 DIAGNOSIS — M1990 Unspecified osteoarthritis, unspecified site: Secondary | ICD-10-CM | POA: Diagnosis not present

## 2018-09-16 DIAGNOSIS — F17210 Nicotine dependence, cigarettes, uncomplicated: Secondary | ICD-10-CM | POA: Insufficient documentation

## 2018-09-16 HISTORY — PX: INGUINAL HERNIA REPAIR: SHX194

## 2018-09-16 HISTORY — PX: CYSTOSCOPY/URETEROSCOPY/HOLMIUM LASER/STENT PLACEMENT: SHX6546

## 2018-09-16 SURGERY — REPAIR, HERNIA, INGUINAL, ADULT
Anesthesia: General | Laterality: Left

## 2018-09-16 MED ORDER — SODIUM CHLORIDE 0.9 % IV SOLN
INTRAVENOUS | Status: DC | PRN
  Administered 2018-09-16: 50 ug/min via INTRAVENOUS

## 2018-09-16 MED ORDER — FENTANYL CITRATE (PF) 100 MCG/2ML IJ SOLN
INTRAMUSCULAR | Status: AC
  Filled 2018-09-16: qty 2

## 2018-09-16 MED ORDER — CEFAZOLIN SODIUM-DEXTROSE 2-4 GM/100ML-% IV SOLN
INTRAVENOUS | Status: AC
  Filled 2018-09-16: qty 100

## 2018-09-16 MED ORDER — MEPERIDINE HCL 50 MG/ML IJ SOLN: 6.2500 mg | INTRAMUSCULAR | Status: DC | PRN

## 2018-09-16 MED ORDER — FENTANYL CITRATE (PF) 100 MCG/2ML IJ SOLN: 25.0000 ug | INTRAMUSCULAR | Status: DC | PRN

## 2018-09-16 MED ORDER — PROPOFOL 10 MG/ML IV BOLUS
INTRAVENOUS | Status: DC | PRN
  Administered 2018-09-16: 170 mg via INTRAVENOUS
  Administered 2018-09-16: 30 mg via INTRAVENOUS

## 2018-09-16 MED ORDER — FAMOTIDINE 20 MG PO TABS
ORAL_TABLET | ORAL | Status: AC
  Administered 2018-09-16: 07:00:00 20 mg via ORAL
  Filled 2018-09-16: qty 1

## 2018-09-16 MED ORDER — BUPIVACAINE-EPINEPHRINE (PF) 0.25% -1:200000 IJ SOLN
INTRAMUSCULAR | Status: AC
  Filled 2018-09-16: qty 30

## 2018-09-16 MED ORDER — PROMETHAZINE HCL 25 MG/ML IJ SOLN: 6.2500 mg | INTRAMUSCULAR | Status: DC | PRN

## 2018-09-16 MED ORDER — PHENYLEPHRINE HCL (PRESSORS) 10 MG/ML IV SOLN
INTRAVENOUS | Status: DC | PRN
  Administered 2018-09-16 (×2): 100 ug via INTRAVENOUS
  Administered 2018-09-16 (×2): 200 ug via INTRAVENOUS
  Administered 2018-09-16: 100 ug via INTRAVENOUS

## 2018-09-16 MED ORDER — FENTANYL CITRATE (PF) 250 MCG/5ML IJ SOLN
INTRAMUSCULAR | Status: AC
  Filled 2018-09-16: qty 5

## 2018-09-16 MED ORDER — OXYCODONE HCL 5 MG PO TABS: 5.0000 mg | ORAL_TABLET | Freq: Once | ORAL | Status: DC | PRN

## 2018-09-16 MED ORDER — ONDANSETRON HCL 4 MG/2ML IJ SOLN
INTRAMUSCULAR | Status: AC
  Filled 2018-09-16: qty 2

## 2018-09-16 MED ORDER — EPHEDRINE SULFATE 50 MG/ML IJ SOLN
INTRAMUSCULAR | Status: AC
  Filled 2018-09-16: qty 1

## 2018-09-16 MED ORDER — OXYBUTYNIN CHLORIDE 5 MG PO TABS: 5.0000 mg | ORAL_TABLET | Freq: Three times a day (TID) | ORAL | 0 refills | Status: DC | PRN

## 2018-09-16 MED ORDER — MIDAZOLAM HCL 2 MG/2ML IJ SOLN
INTRAMUSCULAR | Status: DC | PRN
  Administered 2018-09-16 (×2): 1 mg via INTRAVENOUS

## 2018-09-16 MED ORDER — ACETAMINOPHEN 10 MG/ML IV SOLN
INTRAVENOUS | Status: DC | PRN
  Administered 2018-09-16: 1000 mg via INTRAVENOUS

## 2018-09-16 MED ORDER — PHENYLEPHRINE HCL (PRESSORS) 10 MG/ML IV SOLN
INTRAVENOUS | Status: AC
  Filled 2018-09-16: qty 1

## 2018-09-16 MED ORDER — LIDOCAINE HCL (CARDIAC) PF 100 MG/5ML IV SOSY
PREFILLED_SYRINGE | INTRAVENOUS | Status: DC | PRN
  Administered 2018-09-16: 100 mg via INTRAVENOUS

## 2018-09-16 MED ORDER — LACTATED RINGERS IV SOLN
INTRAVENOUS | Status: DC
  Administered 2018-09-16: 07:00:00 via INTRAVENOUS

## 2018-09-16 MED ORDER — EPHEDRINE SULFATE 50 MG/ML IJ SOLN
INTRAMUSCULAR | Status: DC | PRN
  Administered 2018-09-16: 15 mg via INTRAVENOUS

## 2018-09-16 MED ORDER — BUPIVACAINE-EPINEPHRINE 0.25% -1:200000 IJ SOLN
INTRAMUSCULAR | Status: DC | PRN
  Administered 2018-09-16: 30 mL

## 2018-09-16 MED ORDER — IOPAMIDOL (ISOVUE-200) INJECTION 41%
INTRAVENOUS | Status: DC | PRN
  Administered 2018-09-16: 18 mL

## 2018-09-16 MED ORDER — FAMOTIDINE 20 MG PO TABS
20.0000 mg | ORAL_TABLET | Freq: Once | ORAL | Status: AC
  Administered 2018-09-16: 07:00:00 20 mg via ORAL

## 2018-09-16 MED ORDER — LACTATED RINGERS IV SOLN
INTRAVENOUS | Status: DC | PRN
  Administered 2018-09-16: 08:00:00 via INTRAVENOUS

## 2018-09-16 MED ORDER — TAMSULOSIN HCL 0.4 MG PO CAPS: 0.4000 mg | ORAL_CAPSULE | Freq: Every day | ORAL | 0 refills | Status: DC

## 2018-09-16 MED ORDER — DEXAMETHASONE SODIUM PHOSPHATE 10 MG/ML IJ SOLN
INTRAMUSCULAR | Status: DC | PRN
  Administered 2018-09-16: 5 mg via INTRAVENOUS

## 2018-09-16 MED ORDER — ONDANSETRON HCL 4 MG/2ML IJ SOLN
INTRAMUSCULAR | Status: DC | PRN
  Administered 2018-09-16: 4 mg via INTRAVENOUS

## 2018-09-16 MED ORDER — PROPOFOL 10 MG/ML IV BOLUS
INTRAVENOUS | Status: AC
  Filled 2018-09-16: qty 20

## 2018-09-16 MED ORDER — HYDROCODONE-ACETAMINOPHEN 5-325 MG PO TABS: 1.0000 | ORAL_TABLET | Freq: Once | ORAL | Status: DC

## 2018-09-16 MED ORDER — DEXAMETHASONE SODIUM PHOSPHATE 10 MG/ML IJ SOLN
INTRAMUSCULAR | Status: AC
  Filled 2018-09-16: qty 1

## 2018-09-16 MED ORDER — CEFAZOLIN SODIUM-DEXTROSE 2-4 GM/100ML-% IV SOLN
2.0000 g | INTRAVENOUS | Status: AC
  Administered 2018-09-16: 2 g via INTRAVENOUS

## 2018-09-16 MED ORDER — FENTANYL CITRATE (PF) 100 MCG/2ML IJ SOLN
INTRAMUSCULAR | Status: DC | PRN
  Administered 2018-09-16 (×4): 50 ug via INTRAVENOUS

## 2018-09-16 MED ORDER — HYDROCODONE-ACETAMINOPHEN 5-325 MG PO TABS: 1.0000 | ORAL_TABLET | ORAL | 0 refills | Status: AC | PRN

## 2018-09-16 MED ORDER — OXYCODONE HCL 5 MG/5ML PO SOLN: 5.0000 mg | Freq: Once | ORAL | Status: DC | PRN

## 2018-09-16 MED ORDER — MIDAZOLAM HCL 2 MG/2ML IJ SOLN
INTRAMUSCULAR | Status: AC
  Filled 2018-09-16: qty 2

## 2018-09-16 MED ORDER — HYDROCODONE-ACETAMINOPHEN 5-325 MG PO TABS
ORAL_TABLET | ORAL | Status: AC
  Administered 2018-09-16: 1
  Filled 2018-09-16: qty 1

## 2018-09-16 SURGICAL SUPPLY — 55 items
BAG DRAIN CYSTO-URO LG1000N (MISCELLANEOUS) ×3 IMPLANT
BASKET ZERO TIP 1.9FR (BASKET) ×2 IMPLANT
BLADE SURG 15 STRL LF DISP TIS (BLADE) ×1 IMPLANT
BLADE SURG 15 STRL SS (BLADE) ×2
BRUSH SCRUB EZ 1% IODOPHOR (MISCELLANEOUS) ×3 IMPLANT
CANISTER SUCT 1200ML W/VALVE (MISCELLANEOUS) ×3 IMPLANT
CATH URETL 5X70 OPEN END (CATHETERS) ×3 IMPLANT
CHLORAPREP W/TINT 26 (MISCELLANEOUS) ×3 IMPLANT
CNTNR SPEC 2.5X3XGRAD LEK (MISCELLANEOUS)
CONT SPEC 4OZ STER OR WHT (MISCELLANEOUS)
CONTAINER SPEC 2.5X3XGRAD LEK (MISCELLANEOUS) IMPLANT
COVER WAND RF STERILE (DRAPES) ×3 IMPLANT
DERMABOND ADVANCED (GAUZE/BANDAGES/DRESSINGS) ×2
DERMABOND ADVANCED .7 DNX12 (GAUZE/BANDAGES/DRESSINGS) ×1 IMPLANT
DRAIN PENROSE 1/4X12 LTX (DRAIN) ×3 IMPLANT
DRAPE C-ARM XRAY 36X54 (DRAPES) ×2 IMPLANT
DRAPE LAPAROTOMY 100X77 ABD (DRAPES) ×3 IMPLANT
DRAPE UTILITY 15X26 TOWEL STRL (DRAPES) ×3 IMPLANT
DRSG TEGADERM 2-3/8X2-3/4 SM (GAUZE/BANDAGES/DRESSINGS) ×2 IMPLANT
ELECT REM PT RETURN 9FT ADLT (ELECTROSURGICAL) ×3
ELECTRODE REM PT RTRN 9FT ADLT (ELECTROSURGICAL) ×1 IMPLANT
FIBER LASER TRAC TIP (UROLOGICAL SUPPLIES) ×4 IMPLANT
GLOVE BIO SURGEON STRL SZ 6.5 (GLOVE) ×6 IMPLANT
GLOVE BIO SURGEONS STRL SZ 6.5 (GLOVE) ×3
GLOVE INDICATOR 6.5 STRL GRN (GLOVE) ×3 IMPLANT
GOWN STRL REUS W/ TWL LRG LVL3 (GOWN DISPOSABLE) ×4 IMPLANT
GOWN STRL REUS W/TWL LRG LVL3 (GOWN DISPOSABLE) ×8
GUIDEWIRE GREEN .038 145CM (MISCELLANEOUS) IMPLANT
GUIDEWIRE STR DUAL SENSOR (WIRE) ×3 IMPLANT
INFUSOR MANOMETER BAG 3000ML (MISCELLANEOUS) ×3 IMPLANT
INTRODUCER DILATOR DOUBLE (INTRODUCER) IMPLANT
KIT TURNOVER CYSTO (KITS) ×3 IMPLANT
LABEL OR SOLS (LABEL) ×3 IMPLANT
MESH HERNIA 6X13 (Mesh General) ×2 IMPLANT
NEEDLE HYPO 22GX1.5 SAFETY (NEEDLE) ×3 IMPLANT
NS IRRIG 500ML POUR BTL (IV SOLUTION) ×3 IMPLANT
PACK BASIN MINOR ARMC (MISCELLANEOUS) ×3 IMPLANT
PACK CYSTO AR (MISCELLANEOUS) ×3 IMPLANT
SET CYSTO W/LG BORE CLAMP LF (SET/KITS/TRAYS/PACK) ×3 IMPLANT
SHEATH URETERAL 12FRX35CM (MISCELLANEOUS) IMPLANT
SOL .9 NS 3000ML IRR  AL (IV SOLUTION) ×2
SOL .9 NS 3000ML IRR UROMATIC (IV SOLUTION) ×1 IMPLANT
STENT URET 6FRX24 CONTOUR (STENTS) IMPLANT
STENT URET 6FRX26 CONTOUR (STENTS) ×2 IMPLANT
SURGILUBE 2OZ TUBE FLIPTOP (MISCELLANEOUS) ×3 IMPLANT
SUT MNCRL 4-0 (SUTURE) ×2
SUT MNCRL 4-0 27XMFL (SUTURE) ×1
SUT SURGILON 0 BLK (SUTURE) ×8 IMPLANT
SUT VIC AB 2-0 BRD 54 (SUTURE) ×3 IMPLANT
SUT VIC AB 2-0 CT2 27 (SUTURE) ×3 IMPLANT
SUT VIC AB 3-0 SH 27 (SUTURE) ×4
SUT VIC AB 3-0 SH 27X BRD (SUTURE) ×2 IMPLANT
SUTURE MNCRL 4-0 27XMF (SUTURE) ×1 IMPLANT
SYR 10ML LL (SYRINGE) ×3 IMPLANT
WATER STERILE IRR 1000ML POUR (IV SOLUTION) ×3 IMPLANT

## 2018-09-16 NOTE — Discharge Instructions (Signed)
You have a ureteral stent in place.  This is a tube that extends from your kidney to your bladder.  This may cause urinary bleeding, burning with urination, and urinary frequency.  Please call our office or present to the ED if you develop fevers >101 or pain which is not able to be controlled with oral pain medications.  You may be given either Flomax and/ or ditropan to help with bladder spasms and stent pain in addition to pain medications.    Your stent is on a string.  This is taped to the head of your penis.  On Thursday morning, you may untape and pull the string gently until the entire stent is removed.  If you have any issues or concerns, please call our office.  [facility] [address], [place], [place] [address] [phone]  AMBULATORY SURGERY  DISCHARGE INSTRUCTIONS   1) The drugs that you were given will stay in your system until tomorrow so for the next 24 hours you should not:  A) Drive an automobile B) Make any legal decisions C) Drink any alcoholic beverage   2) You may resume regular meals tomorrow.  Today it is better to start with liquids and gradually work up to solid foods.  You may eat anything you prefer, but it is better to start with liquids, then soup and crackers, and gradually work up to solid foods.   3) Please notify your doctor immediately if you have any unusual bleeding, trouble breathing, redness and pain at the surgery site, drainage, fever, or pain not relieved by medication.    4) Additional Instructions:        Please contact your physician with any problems or Same Day Surgery at [phone], Monday through Friday 6 am to 4 pm, or [place] at [facility] number at [phone].   Diet: Resume home heart healthy regular diet.   Activity: No heavy lifting >20 pounds (children, pets, laundry, garbage) or strenuous activity until follow-up, but light activity and walking are encouraged. Do not  drive or drink alcohol if taking narcotic pain medications.  Wound care: May shower with soapy water and pat dry (do not rub incisions), but no baths or submerging incision underwater until follow-up. (no swimming)   Medications: Resume all home medications. For mild to moderate pain: acetaminophen (Tylenol) or ibuprofen (if no kidney disease). Combining Tylenol with alcohol can substantially increase your risk of causing liver disease. Narcotic pain medications, if prescribed, can be used for severe pain, though may cause nausea, constipation, and drowsiness. Do not combine Tylenol and Norco within a 6 hour period as Norco contains Tylenol. If you do not need the narcotic pain medication, you do not need to fill the prescription.  Call office [phone]) at any time if any questions, worsening pain, fevers/chills, bleeding, drainage from incision site, or other concerns.

## 2018-09-16 NOTE — Interval H&P Note (Signed)
History and Physical Interval Note:  09/16/2018 6:46 AM  Samuel Boyd  has presented today for surgery, with the diagnosis of non-recurrent unilateral inguinal hernia without obstruction or gangrene. LEFT URETERAL STONE.  The various methods of treatment have been discussed with the patient and family. After consideration of risks, benefits and other options for treatment, the patient has consented to  Procedure(s): HERNIA REPAIR INGUINAL ADULT (Left) CYSTOSCOPY/URETEROSCOPY/HOLMIUM LASER/STENT PLACEMENT, LEFT (Left) as a surgical intervention.  The patient's history has been reviewed, patient examined, no change in status, stable for surgery.  I have reviewed the patient's chart and labs.  Left sided marked in the pre procedure room. Questions were answered to the patient's satisfaction.     Herbert Pun

## 2018-09-16 NOTE — Anesthesia Postprocedure Evaluation (Signed)
Anesthesia Post Note  Patient: Samuel Boyd  Procedure(s) Performed: HERNIA REPAIR INGUINAL ADULT (Left ) CYSTOSCOPY/URETEROSCOPY/HOLMIUM LASER/STENT PLACEMENT, LEFT (Left )  Patient location during evaluation: PACU Anesthesia Type: General Level of consciousness: awake and alert and oriented Pain management: pain level controlled Vital Signs Assessment: post-procedure vital signs reviewed and stable Respiratory status: spontaneous breathing, nonlabored ventilation and respiratory function stable Cardiovascular status: blood pressure returned to baseline and stable Postop Assessment: no signs of nausea or vomiting Anesthetic complications: no     Last Vitals:  Vitals:   09/16/18 1100 09/16/18 1119  BP: (!) 114/93 (!) 130/98  Pulse: 91 (!) 106  Resp: 20 18  Temp:  (!) 36.1 C  SpO2: 95% 93%    Last Pain:  Vitals:   09/16/18 1150  TempSrc:   PainSc: 4                   

## 2018-09-16 NOTE — Interval H&P Note (Signed)
History and Physical Interval Note:  09/16/2018 7:15 AM  Samuel Boyd  has presented today for surgery, with the diagnosis of non-recurrent unilateral inguinal hernia without obstruction or gangrene. LEFT URETERAL STONE.  The various methods of treatment have been discussed with the patient and family. After consideration of risks, benefits and other options for treatment, the patient has consented to  Procedure(s): HERNIA REPAIR INGUINAL ADULT (Left) CYSTOSCOPY/URETEROSCOPY/HOLMIUM LASER/STENT PLACEMENT, LEFT (Left) as a surgical intervention.  The patient's history has been reviewed, patient examined, no change in status, stable for surgery.  I have reviewed the patient's chart and labs.  Questions were answered to the patient's satisfaction.    RRR CTAB   Hollice Espy

## 2018-09-16 NOTE — Anesthesia Procedure Notes (Signed)
Procedure Name: LMA Insertion Date/Time: 09/16/2018 7:41 AM Performed by: Justus Memory, CRNA Pre-anesthesia Checklist: Patient identified, Patient being monitored, Timeout performed, Emergency Drugs available and Suction available Patient Re-evaluated:Patient Re-evaluated prior to induction Oxygen Delivery Method: Circle system utilized Preoxygenation: Pre-oxygenation with 100% oxygen Induction Type: IV induction Ventilation: Mask ventilation without difficulty LMA: LMA inserted LMA Size: 4.5 Tube type: Oral Number of attempts: 1 Placement Confirmation: positive ETCO2 and breath sounds checked- equal and bilateral Tube secured with: Tape Dental Injury: Teeth and Oropharynx as per pre-operative assessment

## 2018-09-16 NOTE — Op Note (Signed)
Date of procedure: 09/16/18  Preoperative diagnosis:  1. Left distal ureteral calculi  Postoperative diagnosis:  1. Same as above  Procedure: 1. Left retrograde pyelogram 2. Left ureteroscopy 3. Laser lithotripsy 4. Basket extraction of stone fragment 5. Left ureteral stent placement  Surgeon: Samuel Espy, MD  Anesthesia: General  Complications: None  Intraoperative findings: Multiple left distal ureteral stones measuring up to 7 mm  EBL: Minimal  Specimens: Stone fragments  Drains: 6 x 26 French double-J ureteral stent on left, tether left in place  Indication: Samuel Boyd is a 73 y.o. patient with left inguinal hernia as well as incidental left distal ureteral calculi.  After reviewing the management options for treatment, he elected to proceed with the above surgical procedure(s). We have discussed the potential benefits and risks of the procedure, side effects of the proposed treatment, the likelihood of the patient achieving the goals of the procedure, and any potential problems that might occur during the procedure or recuperation. Informed consent has been obtained.  Description of procedure:  The patient was taken to the operating room and general anesthesia was induced.  The patient was placed in the dorsal lithotomy position, prepped and draped in the usual sterile fashion, and preoperative antibiotics were administered. A preoperative time-out was performed.   21 [place] scope was advanced per urethra after using male sounds to gently dilate the meatus.  Notably, the prostate was found to be enlarged with a slightly elevated bladder neck.  Bladder itself was mildly trabeculated.  Attention was turned to left UO which was cannulated using a 5 [place] open-ended ureteral catheter.  A gentle retrograde pyelogram was performed on the side which showed some slight filling defects within the distal ureter without overt hydroureteronephrosis.  A sensor wire was then  placed up to level the kidney without difficulty.  This was snapped in place as a safety wire.  A 4.5 semirigid ureteroscope was then brought in and advanced to level of the stone just within the distal ureter.  There are some J hooking of the distal ureter appreciated.  A 200 m laser fiber was then brought in and using the settings of 0.8 J and 10 Hz, the stones were fragmented into 3 to 4 pieces each.  A 1.9 French tipless angle basket was then used to extract each and every stone fragment.  The scope was then advanced past the level of the iliacs were no additional stones or stone fragments were identified.  Additional retrograde pyelogram was performed on the side which showed no additional filling defects and no hydronephrosis or extravasation.  The scope was then backed in length of the urinary stream with some very minimal edema but otherwise unremarkable.  Safety wires and backloaded over rigid cystoscope.  A 626 French double-J ureteral stent was advanced over the wire up to level the kidney.  The wires partially drawn till full coils noted both within the renal pelvis as well as within the bladder.  The stent string was left affixed to the distal coil of the stent which was secured to the patient's glans using Mastisol and Tegaderm.  He is in clean and dry, repositioned in supine position.  At this point in time, the remainder of the procedure was referred by Dr. Windell Moment.  Please see is operative report for details.  Plan: Patient will be discharged home later today.  He will may remove his own stent on Thursday.  He will follow-up in 4 weeks with renal ultrasound prior.  Samuel Boyd, M.D.

## 2018-09-16 NOTE — Op Note (Signed)
Preoperative diagnosis: Left Inguinal Hernia.  Postoperative diagnosis: Left Indirect Inguinal Hernia.  Procedure: Left Inguinal hernia repair with mesh  Anesthesia: General  Surgeon: Dr. Windell Moment  Wound Classification: Clean  Indications:  Patient is a 73 y.o. male developed a symptomatic left inguinal hernia. Repair was indicated to avoid complications of incarceration, obstruction and pain, and a prosthetic mesh repair was elected.  Findings: 1. Vas Deferens and cord structures identified and preserved 2. An indirect inguinal hernia was identified 3. Pre Shaped Hernia System used for repair 4. Adequate hemostasis achieved  Description of procedure: The patient was taken to the operating room. A time-out was completed verifying correct patient, procedure, site, positioning, and implant(s) and/or special equipment prior to beginning this procedure. General anesthesia was induced. The left groin was prepped and draped in the usual sterile fashion. An incision was marked in a natural skin crease and planned to end near the pubic tubercle.  The skin crease incision was made with a knife and deepened through Scarpa's and Camper's fascia with electrocautery until the aponeurosis of the external oblique was encountered. This was cleaned and the external ring was exposed. Hemostasis was achieved in the wound. An incision was made in the midportion of the external oblique aponeurosis in the direction of its fibers. The ilioinguinal nerve was identified and protected throughout the dissection. Flaps of the external oblique were developed cephalad and inferiorly.  The cord was identified. It was gently dissected free at the pubic tubercle and encircled with a Penrose drain. Attention was directed to the anteromedial aspect of the cord, where an indirect hernia sac was identified. The sac was carefully dissected free of the cord down to the level of the internal ring. The vas and testicular vessels  were identified and protected from harm. The sac was opened and contents were reduced. A finger was passed into the peritoneal cavity and the floor of the inguinal canal assessed and found to be strong. The femoral canal was palpated and no hernia identified. The sac was twisted and suture ligated with 2-0 Vicryl. Redundant sac was excised and submitted to pathology. The stump of the sac was checked for hemostasis and allowed to retract into the abdomen.  Attention then turned to the floor of the canal, which appeared to be grossly weakened without a well-defined defect or sac. The Pre Shape Hernia System mesh was inserted. Beginning at the pubic tubercle, the mesh was sutured to the inguinal ligament inferiorly and the conjoint tendon superiorly using interrupted 0 nonabsorbable sutures. Care was taken to assure that the mesh was placed in a relaxed fashion to avoid excessive tension and that no neurovascular structures were caught in the repair. Laterally, the tails of the mesh were crossed and the internal ring recreated.  Hemostasis was again checked. The Penrose drain was removed. The external oblique aponeurosis was closed with a running suture of 3-0 Vicryl, taking care not to catch the ilioinguinal nerve in the suture line. Scarpa's fascia was closed with interrupted 3-0 Vicryl.  The skin was closed with a subcuticular stitch of Monocryl 4-0. Dermabond was applied.  The testis was gently pulled down into its anatomic position in the scrotum.  The patient tolerated the procedure well and was taken to the postanesthesia care unit in stable condition.   Specimen: Hernia sac and cord lipoma  Complications: None  Estimated Blood Loss: 10 mL

## 2018-09-16 NOTE — Transfer of Care (Signed)
Immediate Anesthesia Transfer of Care Note  Patient: Samuel Boyd  Procedure(s) Performed: HERNIA REPAIR INGUINAL ADULT (Left ) CYSTOSCOPY/URETEROSCOPY/HOLMIUM LASER/STENT PLACEMENT, LEFT (Left )  Patient Location: PACU  Anesthesia Type:General  Level of Consciousness: sedated  Airway & Oxygen Therapy: Patient Spontanous Breathing and Patient connected to face mask oxygen  Post-op Assessment: Report given to RN and Post -op Vital signs reviewed and stable  Post vital signs: Reviewed and stable  Last Vitals:  Vitals Value Taken Time  BP 98/79 09/16/18 1001  Temp    Pulse 58 09/16/18 1002  Resp 16 09/16/18 1002  SpO2 95 % 09/16/18 1002  Vitals shown include unvalidated device data.  Last Pain:  Vitals:   09/16/18 0624  TempSrc:   PainSc: 5          Complications: No apparent anesthesia complications

## 2018-09-16 NOTE — Anesthesia Preprocedure Evaluation (Signed)
Anesthesia Evaluation  Patient identified by MRN, date of birth, ID band Patient awake    Reviewed: Allergy & Precautions, NPO status , Patient's Chart, lab work & pertinent test results  History of Anesthesia Complications Negative for: history of anesthetic complications  Airway Mallampati: II  TM Distance: >3 FB Neck ROM: Full    Dental  (+) Edentulous Upper, Edentulous Lower   Pulmonary neg sleep apnea, neg COPD, Current Smoker and Patient abstained from smoking.,    breath sounds clear to auscultation- rhonchi (-) wheezing      Cardiovascular hypertension, Pt. on medications (-) CAD, (-) Past MI, (-) Cardiac Stents and (-) CABG + dysrhythmias Atrial Fibrillation  Rhythm:Regular Rate:Normal - Systolic murmurs and - Diastolic murmurs    Neuro/Psych neg Seizures negative neurological ROS  negative psych ROS   GI/Hepatic negative GI ROS, Neg liver ROS,   Endo/Other  negative endocrine ROSneg diabetes  Renal/GU negative Renal ROS     Musculoskeletal  (+) Arthritis ,   Abdominal (+) - obese,   Peds  Hematology negative hematology ROS (+)   Anesthesia Other Findings Past Medical History: No date: Arthritis No date: Dysrhythmia No date: History of kidney stones No date: Hypertension No date: Pneumonia   Reproductive/Obstetrics                             Anesthesia Physical Anesthesia Plan  ASA: III  Anesthesia Plan: General   Post-op Pain Management:    Induction: Intravenous  PONV Risk Score and Plan: 0 and Ondansetron and Dexamethasone  Airway Management Planned: LMA  Additional Equipment:   Intra-op Plan:   Post-operative Plan:   Informed Consent: I have reviewed the patients History and Physical, chart, labs and discussed the procedure including the risks, benefits and alternatives for the proposed anesthesia with the patient or authorized representative who has  indicated his/her understanding and acceptance.     Dental advisory given  Plan Discussed with: CRNA and Anesthesiologist  Anesthesia Plan Comments:         Anesthesia Quick Evaluation

## 2018-09-16 NOTE — Anesthesia Post-op Follow-up Note (Signed)
Anesthesia QCDR form completed.        

## 2018-09-17 ENCOUNTER — Telehealth: Payer: Self-pay | Admitting: Radiology

## 2018-09-17 ENCOUNTER — Encounter: Payer: Self-pay | Admitting: General Surgery

## 2018-09-17 LAB — SURGICAL PATHOLOGY

## 2018-09-17 NOTE — Progress Notes (Signed)
Patient is distributing urine issues since the cystoscopy. He states he cannot stand up without urinating on himself. I told the patient to call Dr. Cherrie Gauze office to alert her of the new symptom.

## 2018-09-17 NOTE — Telephone Encounter (Signed)
Suspect his stent may be partially dislodged.  Please have  him go ahead and just remove his stent.  Hollice Espy, MD

## 2018-09-17 NOTE — Telephone Encounter (Signed)
Notified patient of Dr Cherrie Gauze note below. Patient expresses understanding of instructions.  Suspect his stent may be partially dislodged.  Please have  him go ahead and just remove his stent.  Hollice Espy, MD

## 2018-09-17 NOTE — Telephone Encounter (Signed)
Patient reports urinary incontinence upon standing. Reports taking oxybutynin as prescribed. Please advise. Patient may be contacted at [phone].

## 2018-09-21 ENCOUNTER — Other Ambulatory Visit: Payer: Self-pay | Admitting: Urology

## 2018-09-26 LAB — CALCULI, WITH PHOTOGRAPH (CLINICAL LAB)
Calcium Oxalate Dihydrate: 20 %
Calcium Oxalate Monohydrate: 80 %
Weight Calculi: 38 mg

## 2018-10-03 ENCOUNTER — Ambulatory Visit
Admission: RE | Admit: 2018-10-03 | Discharge: 2018-10-03 | Disposition: A | Discharge status: Home / Self Care | Payer: Medicare Other | Source: Ambulatory Visit | Attending: Urology | Admitting: Urology

## 2018-10-03 ENCOUNTER — Other Ambulatory Visit: Payer: Self-pay

## 2018-10-03 DIAGNOSIS — N201 Calculus of ureter: Secondary | ICD-10-CM | POA: Insufficient documentation

## 2018-10-03 IMAGING — US US RENAL
1 series · 14 of 25 positions shown · non-contrast
Comparison: CT of the abdomen pelvis dated [DATE]

CLINICAL DATA: 72-year-old male with left ureteral stone.

EXAM:
RENAL / URINARY TRACT ULTRASOUND COMPLETE

[Series 1: us renal · 0.23mm/px · 14 of 35 slices shown]
[im 1/35]
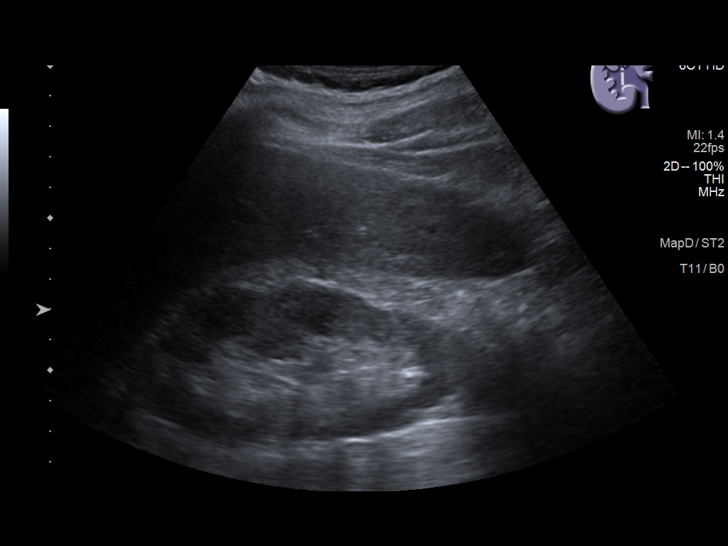
[im 3/35]
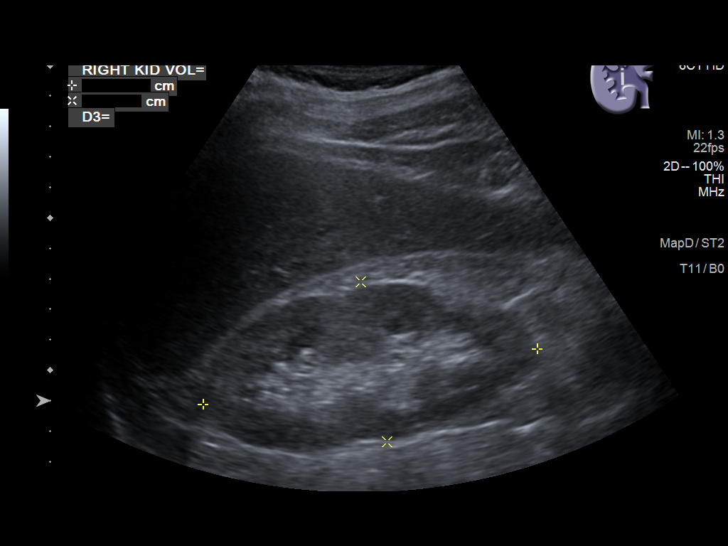
[im 6/35]
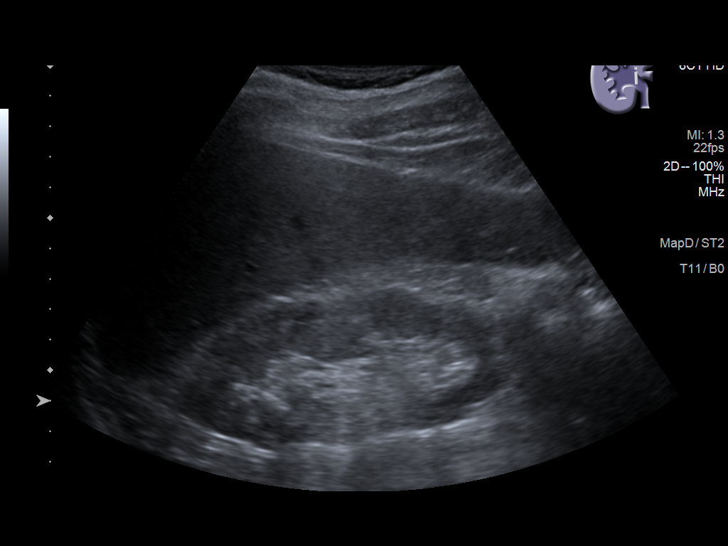
[im 9/35]
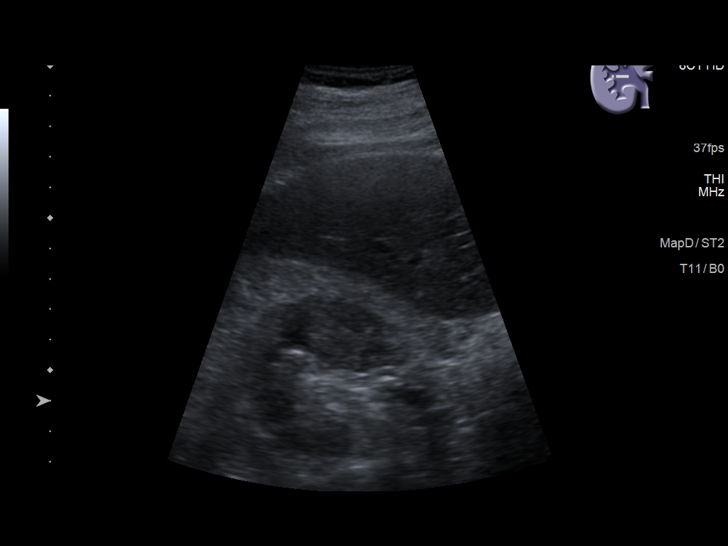
[im 12/35]
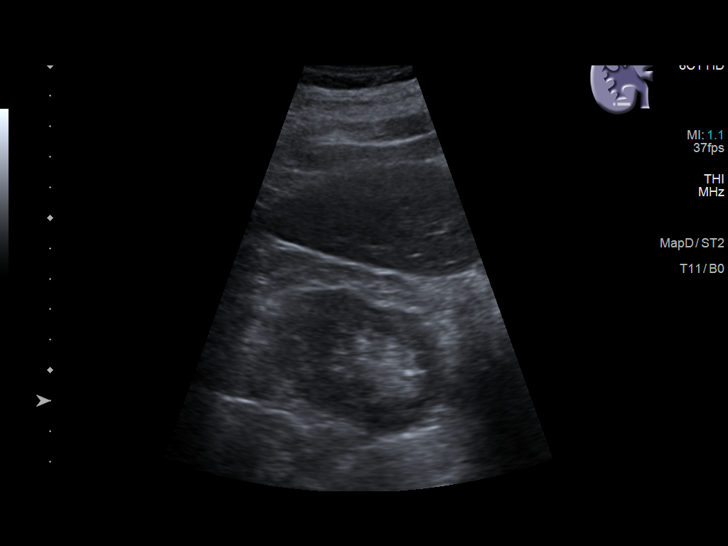
[im 13/35]
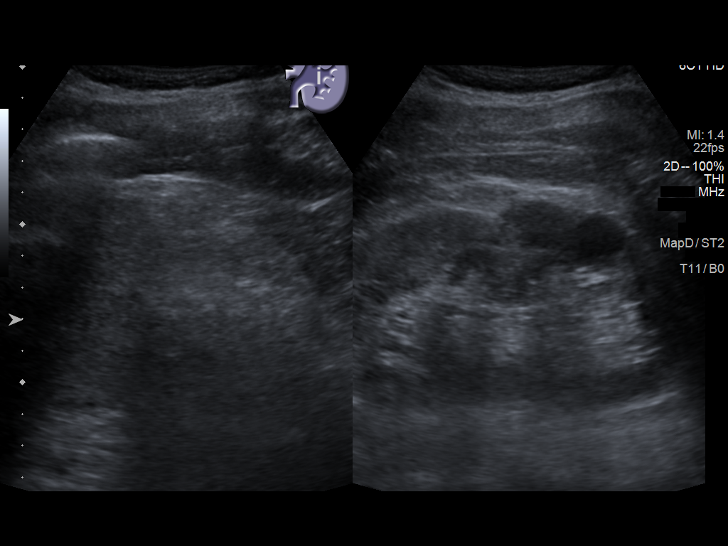
[im 16/35]
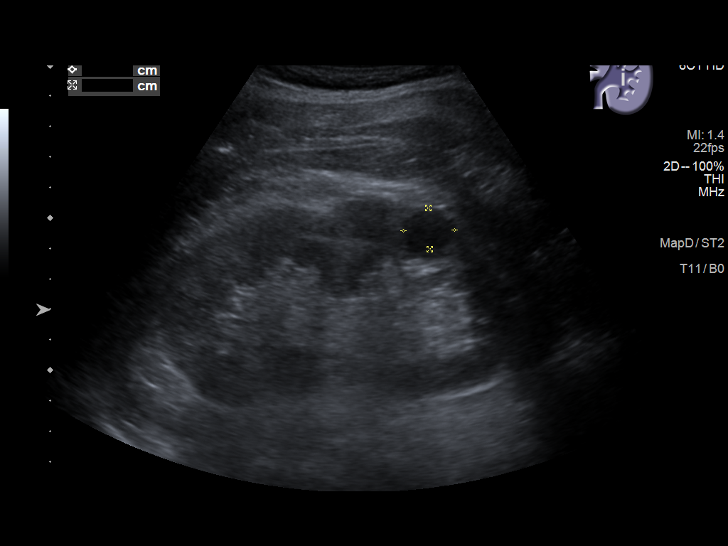
[im 19/35]
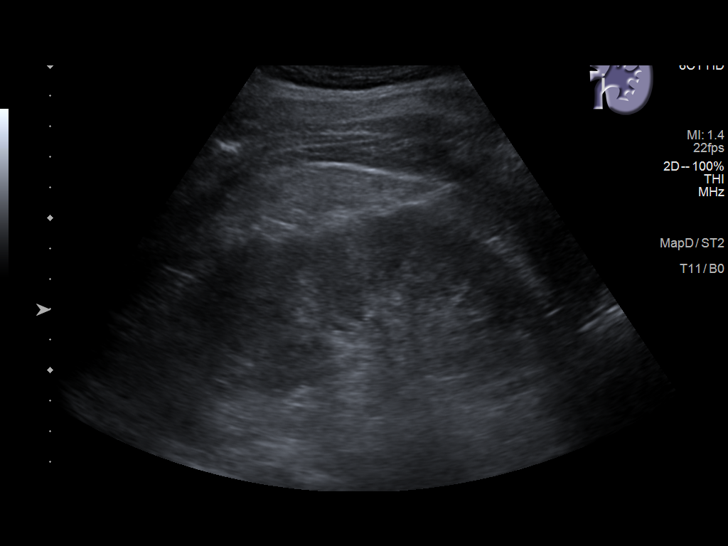
[im 22/35]
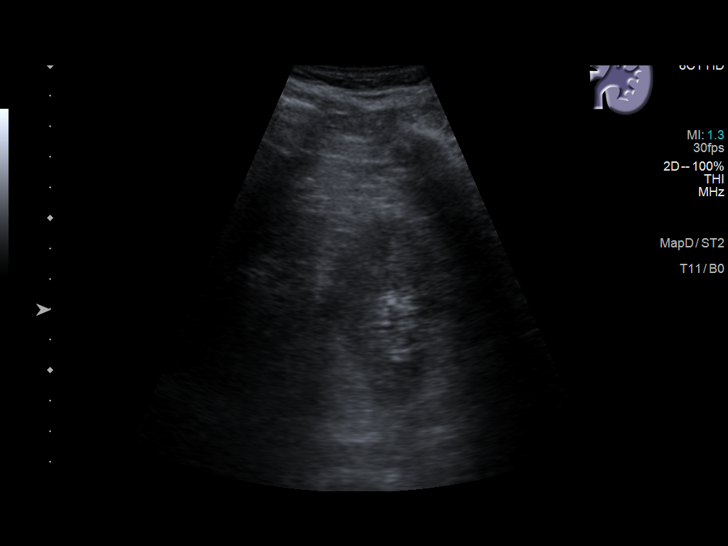
[im 23/35]
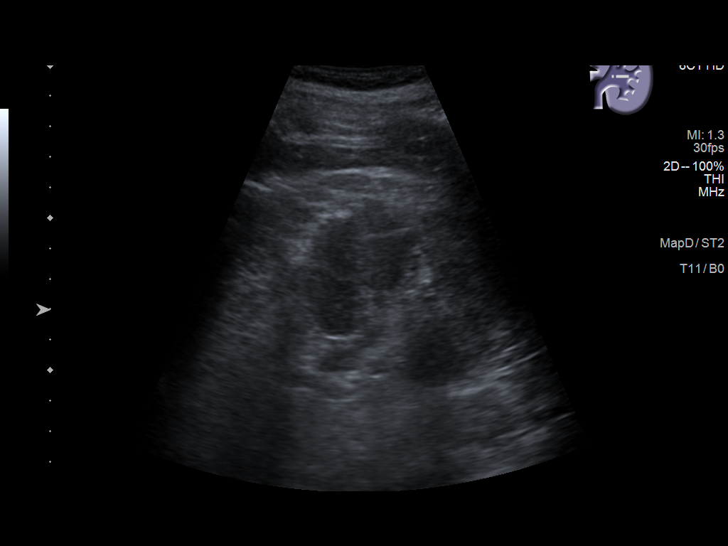
[im 26/35]
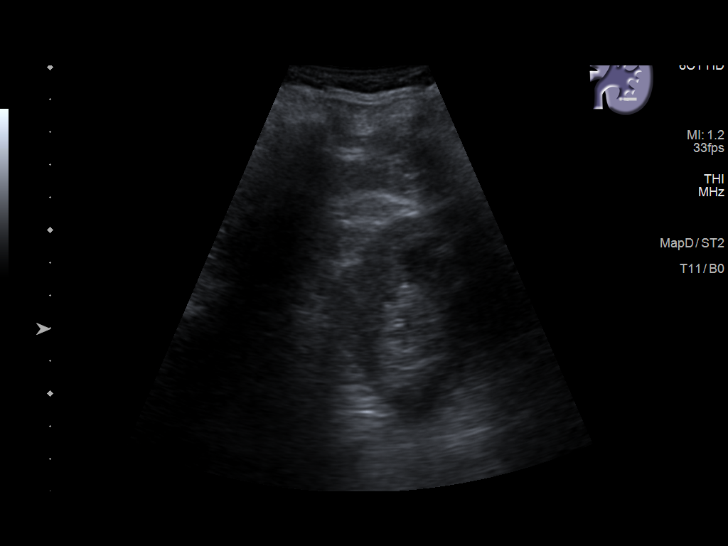
[im 29/35]
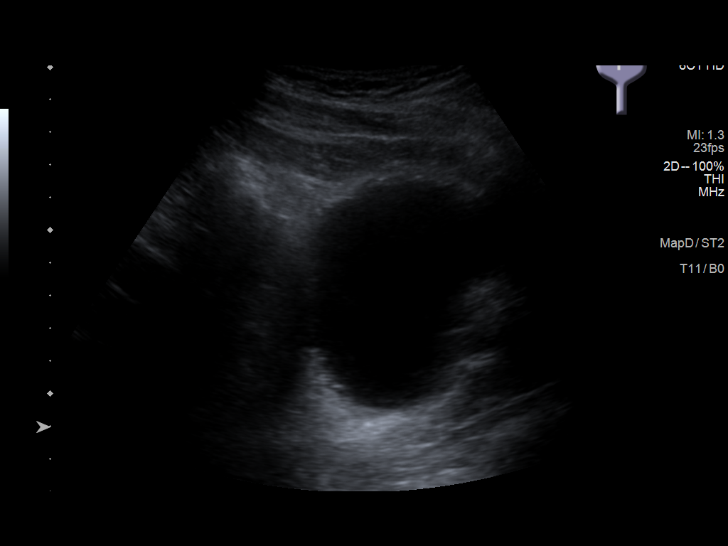
[im 32/35]
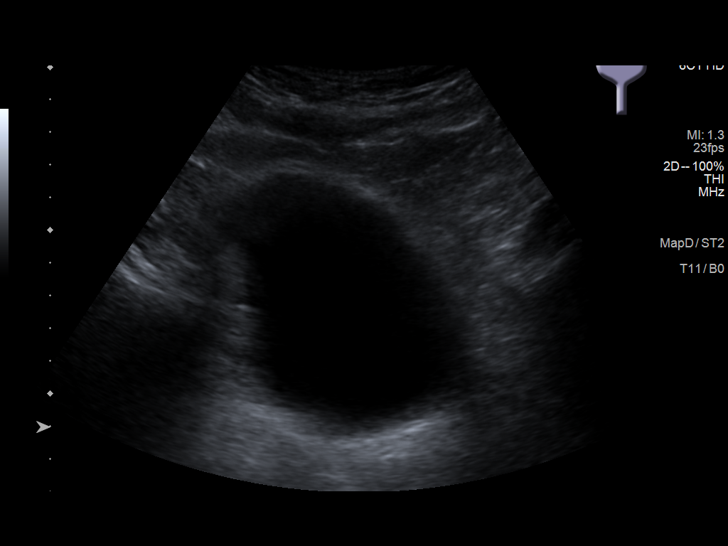
[im 35/35]
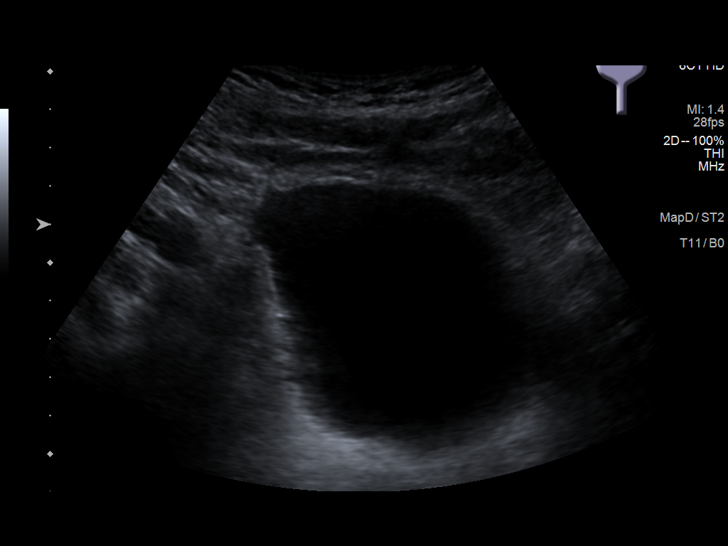

[14 of 25 positions shown; findings below may reference images not displayed]

FINDINGS: Right Kidney:

Renal measurements: 11.1 x 5.3 x 5.8 cm = volume: 163 mL. Mildly
echogenic. No hydronephrosis or shadowing stone.

Left Kidney:

Renal measurements: 11.5 x 6.2 x 5.3 cm = volume: 200 mL. Mildly
echogenic. No hydronephrosis or shadowing stone. There is a 1.7 x
1.3 x 1.6 cm lower pole cyst.

Bladder:

Appears normal for degree of bladder distention.
IMPRESSION: Mildly echogenic kidneys.  No hydronephrosis or shadowing stone.

## 2018-10-09 ENCOUNTER — Encounter: Payer: Self-pay | Admitting: Urology

## 2018-10-09 ENCOUNTER — Other Ambulatory Visit: Payer: Self-pay

## 2018-10-09 ENCOUNTER — Ambulatory Visit (INDEPENDENT_AMBULATORY_CARE_PROVIDER_SITE_OTHER): Payer: Medicare Other | Admitting: Urology

## 2018-10-09 VITALS — BP 109/75 | HR 112 | Ht 68.0 in | Wt 196.0 lb

## 2018-10-09 DIAGNOSIS — N201 Calculus of ureter: Secondary | ICD-10-CM | POA: Diagnosis not present

## 2018-10-09 NOTE — Progress Notes (Signed)
10/09/2018 10:58 AM   Samuel Boyd 28-May-1945 [id number]  Referring provider: Dion Body, MD [facility] [place],  [place] [address]  Chief Complaint  Patient presents with  . Follow-up    HPI: 73 year old male who presents today for follow-up following ureteroscopy.  He underwent a combined procedure with general surgery for left inguinal hernia repair as well as left ureteroscopy with laser lithotripsy of her left distal ureteral calculi.  Postoperatively, the patient had a good amount of scrotal swelling.  He also reports that he had urinary urgency, urge incontinence with a stent in place which resolved after stent removal.  He reports that he was not prepared for this.  He is now returned to baseline.  He denies any flank pain or ongoing urinary symptoms.  No dysuria or gross hematuria.  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  The patient denies a personal history of stones prior to this.  Follow-up renal ultrasound today shows no hydroureteronephrosis or residual stone burden.  He does report that he drinks plenty water.  He often works outside and will become dehydrated on these days, he drinks even more water.  He drinks at least a half a gallon per day.  He does have a high animal protein diet.  He is followed up with Dr. Windell Moment last week and has been cleared   PMH: Past Medical History:  Diagnosis Date  . Arthritis   . Dysrhythmia   . History of kidney stones   . Hypertension   . Pneumonia     Surgical History: Past Surgical History:  Procedure Laterality Date  . CYSTOSCOPY/URETEROSCOPY/HOLMIUM LASER/STENT PLACEMENT Left 09/16/2018   Procedure: CYSTOSCOPY/URETEROSCOPY/HOLMIUM LASER/STENT PLACEMENT, LEFT;  Surgeon: Hollice Espy, MD;  Location: [facility] ORS;  Service: Urology;  Laterality: Left;  . INGUINAL HERNIA REPAIR Left 09/16/2018   Procedure: HERNIA REPAIR INGUINAL ADULT;  Surgeon:  Herbert Pun, MD;  Location: [facility] ORS;  Service: General;  Laterality: Left;    Home Medications:  Allergies as of 10/09/2018   No Known Allergies     Medication List       Accurate as of October 09, 2018 10:58 AM. If you have any questions, ask your nurse or doctor.        cholecalciferol 25 MCG (1000 UT) tablet Commonly known as: VITAMIN D Take 1,000 Units by mouth every evening.   dronedarone 400 MG tablet Commonly known as: MULTAQ Take 400 mg by mouth 2 (two) times daily.   Eliquis 5 MG Tabs tablet Generic drug: apixaban Take 5 mg by mouth 2 (two) times daily.   Fish Oil 1200 MG Caps Take 1,200 mg by mouth every evening.   lisinopril 5 MG tablet Commonly known as: ZESTRIL Take 5 mg by mouth every evening.   loratadine 10 MG tablet Commonly known as: CLARITIN Take 10 mg by mouth daily.   metoprolol tartrate 25 MG tablet Commonly known as: LOPRESSOR Take 25 mg by mouth 2 (two) times daily.   oxybutynin 5 MG tablet Commonly known as: DITROPAN Take 1 tablet (5 mg total) by mouth every 8 (eight) hours as needed for bladder spasms.   rosuvastatin 10 MG tablet Commonly known as: CRESTOR Take 10 mg by mouth daily with supper.   tamsulosin 0.4 MG Caps capsule Commonly known as: FLOMAX Take 0.4 mg by mouth daily after supper. 30 minutes after supper   tamsulosin 0.4 MG Caps capsule Commonly known as: FLOMAX TAKE ONE CAPSULE BY MOUTH DAILY  vitamin B-12 1000 MCG tablet Commonly known as: CYANOCOBALAMIN Take 1,000 mcg by mouth every evening.       Allergies: No Known Allergies  Family History: No family history on file.  Social History:  reports that he has been smoking cigarettes. He has been smoking about 0.50 packs per day. He has never used smokeless tobacco. He reports that he does not drink alcohol or use drugs.  ROS: UROLOGY Frequent Urination?: No Hard to postpone urination?: No Burning/pain with urination?: No Get up at night to  urinate?: No Leakage of urine?: No Urine stream starts and stops?: No Trouble starting stream?: No Do you have to strain to urinate?: No Blood in urine?: No Urinary tract infection?: No Sexually transmitted disease?: No Injury to kidneys or bladder?: No Painful intercourse?: No Weak stream?: No Erection problems?: No Penile pain?: No  Gastrointestinal Nausea?: No Vomiting?: No Indigestion/heartburn?: No Diarrhea?: No Constipation?: No  Constitutional Fever: No Night sweats?: No Weight loss?: No Fatigue?: No  Skin Skin rash/lesions?: No Itching?: No  Eyes Blurred vision?: No Double vision?: No  Ears/Nose/Throat Sore throat?: No Sinus problems?: No  Hematologic/Lymphatic Swollen glands?: No Easy bruising?: No  Cardiovascular Leg swelling?: No Chest pain?: No  Respiratory Cough?: No Shortness of breath?: No  Endocrine Excessive thirst?: No  Musculoskeletal Back pain?: No Joint pain?: No  Neurological Headaches?: No Dizziness?: No  Psychologic Depression?: No Anxiety?: No  Physical Exam: BP 109/75   Pulse (!) 112   Ht 5\' 8"  (1.727 m)   Wt 196 lb (88.9 kg)   BMI 29.80 kg/m   Constitutional:  Alert and oriented, No acute distress. HEENT: [place] AT, moist mucus membranes.  Trachea midline, no masses. Cardiovascular: No clubbing, cyanosis, or edema. Respiratory: Normal respiratory effort, no increased work of breathing. Skin: No rashes, bruises or suspicious lesions. Neurologic: Grossly intact, no focal deficits, moving all 4 extremities. Psychiatric: Normal mood and affect.    Pertinent Imaging: Results for orders placed during the hospital encounter of 10/03/18  US RENAL   Narrative CLINICAL DATA:  73 year old male with left ureteral stone.  EXAM: RENAL / URINARY TRACT ULTRASOUND COMPLETE  COMPARISON:  CT of the abdomen pelvis dated 08/28/2018  FINDINGS: Right Kidney:  Renal measurements: 11.1 x 5.3 x 5.8 cm = volume: 163 mL.  Mildly echogenic. No hydronephrosis or shadowing stone.  Left Kidney:  Renal measurements: 11.5 x 6.2 x 5.3 cm = volume: 200 mL. Mildly echogenic. No hydronephrosis or shadowing stone. There is a 1.7 x 1.3 x 1.6 cm lower pole cyst.  Bladder:  Appears normal for degree of bladder distention.  IMPRESSION: Mildly echogenic kidneys.  No hydronephrosis or shadowing stone.   Electronically Signed   By: Anner Crete M.D.   On: 10/04/2018 00:43    Renal ultrasound personally reviewed today.  Agree with radiologic interpretation.  Assessment & Plan:    1. Left ureteral stone Status post successful left ureteroscopy laser lithotripsy  Stone analysis reviewed with patient  No silent residual hydronephrosis or stone burden  We discussed general stone prevention techniques including drinking plenty water with goal of producing 2.5 L urine daily, increased citric acid intake, avoidance of high oxalate containing foods, and decreased salt intake.  Information about dietary recommendations given today.    F/u as needed  Hollice Espy, MD  [facility] [address] [place], [place] [address] [phone]

## 2018-11-14 ENCOUNTER — Other Ambulatory Visit: Payer: Self-pay

## 2018-12-02 ENCOUNTER — Telehealth: Payer: Self-pay | Admitting: Urology

## 2018-12-02 NOTE — Telephone Encounter (Signed)
Pt's wife called and asked if pt needs to keep taking Tamsulosin because it is up for refill. Please advise.

## 2018-12-02 NOTE — Telephone Encounter (Signed)
I am fairly certain that we only prescribed 30 days of this perioperatively.  Unless he was taking it for BPH, he does not need any more refills.

## 2018-12-03 NOTE — Telephone Encounter (Signed)
Informed wife-verbalized understanding.

## 2019-07-08 ENCOUNTER — Emergency Department: Payer: Medicare Other

## 2019-07-08 ENCOUNTER — Other Ambulatory Visit: Payer: Self-pay

## 2019-07-08 ENCOUNTER — Emergency Department
Admission: EM | Admit: 2019-07-08 | Discharge: 2019-07-08 | Disposition: A | Discharge status: Home / Self Care | Payer: Medicare Other | Attending: Emergency Medicine | Admitting: Emergency Medicine

## 2019-07-08 DIAGNOSIS — Z5321 Procedure and treatment not carried out due to patient leaving prior to being seen by health care provider: Secondary | ICD-10-CM | POA: Insufficient documentation

## 2019-07-08 DIAGNOSIS — M6281 Muscle weakness (generalized): Secondary | ICD-10-CM | POA: Insufficient documentation

## 2019-07-08 DIAGNOSIS — I639 Cerebral infarction, unspecified: Secondary | ICD-10-CM

## 2019-07-08 HISTORY — DX: Cerebral infarction, unspecified: I63.9

## 2019-07-08 LAB — DIFFERENTIAL
Abs Immature Granulocytes: 0.02 10*3/uL (ref 0.00–0.07)
Basophils Absolute: 0 10*3/uL (ref 0.0–0.1)
Basophils Relative: 1 %
Eosinophils Absolute: 0.2 10*3/uL (ref 0.0–0.5)
Eosinophils Relative: 3 %
Immature Granulocytes: 0 %
Lymphocytes Relative: 35 %
Lymphs Abs: 2.3 10*3/uL (ref 0.7–4.0)
Monocytes Absolute: 0.5 10*3/uL (ref 0.1–1.0)
Monocytes Relative: 8 %
Neutro Abs: 3.4 10*3/uL (ref 1.7–7.7)
Neutrophils Relative %: 53 %

## 2019-07-08 LAB — COMPREHENSIVE METABOLIC PANEL
ALT: 13 U/L (ref 0–44)
AST: 20 U/L (ref 15–41)
Albumin: 3.9 g/dL (ref 3.5–5.0)
Alkaline Phosphatase: 51 U/L (ref 38–126)
Anion gap: 8 (ref 5–15)
BUN: 17 mg/dL (ref 8–23)
CO2: 26 mmol/L (ref 22–32)
Calcium: 8.8 mg/dL — ABNORMAL LOW (ref 8.9–10.3)
Chloride: 105 mmol/L (ref 98–111)
Creatinine, Ser: 1.24 mg/dL (ref 0.61–1.24)
GFR calc Af Amer: >60 mL/min (ref >60)
GFR calc non Af Amer: 58 mL/min — ABNORMAL LOW (ref >60)
Glucose, Bld: 124 mg/dL — ABNORMAL HIGH (ref 70–99)
Potassium: 4.1 mmol/L (ref 3.5–5.1)
Sodium: 139 mmol/L (ref 135–145)
Total Bilirubin: 0.7 mg/dL (ref 0.3–1.2)
Total Protein: 7.3 g/dL (ref 6.5–8.1)

## 2019-07-08 LAB — CBC
HCT: 47.5 % (ref 39.0–52.0)
Hemoglobin: 16.3 g/dL (ref 13.0–17.0)
MCH: 33.7 pg (ref 26.0–34.0)
MCHC: 34.3 g/dL (ref 30.0–36.0)
MCV: 98.3 fL (ref 80.0–100.0)
Platelets: 151 10*3/uL (ref 150–400)
RBC: 4.83 MIL/uL (ref 4.22–5.81)
RDW: 12.9 % (ref 11.5–15.5)
WBC: 6.4 10*3/uL (ref 4.0–10.5)
nRBC: 0 % (ref 0.0–0.2)

## 2019-07-08 LAB — APTT: aPTT: 31 seconds (ref 24–36)

## 2019-07-08 LAB — PROTIME-INR
INR: 1.2 (ref 0.8–1.2)
Prothrombin Time: 14.3 seconds (ref 11.4–15.2)

## 2019-07-08 IMAGING — MR MR HEAD W/O CM
12 series · 44 of 48 positions shown · non-contrast
Comparison: Noncontrast head CT [DATE]

CLINICAL DATA: Neuro deficit, acute, stroke suspected. Additional
history provided: Left-sided weakness beginning [REDACTED] morning.

EXAM:
MRI HEAD WITHOUT CONTRAST
TECHNIQUE: Multiplanar, multiecho pulse sequences of the brain and surrounding
structures were obtained without intravenous contrast.

[Series 5: ax dwi_tracew · axial · 3.0mm · 0.60mm/px · z∈[-118,+37]mm · 3 of 48 slices shown]
[im 1/48]
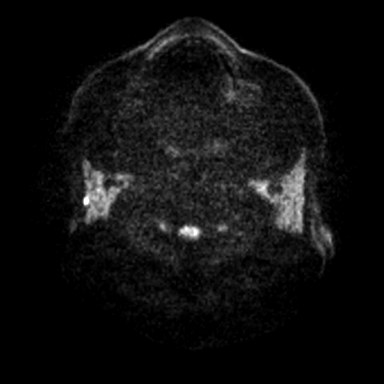
[im 24/48]
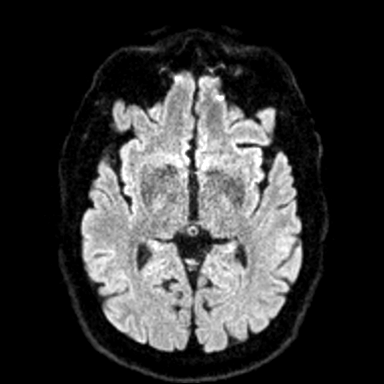
[im 48/48]
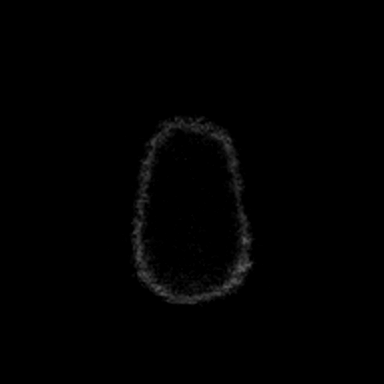

[Series 6: ax dwi_adc · axial · 3.0mm · 0.60mm/px · z∈[-118,+37]mm · 3 of 48 slices shown]
[im 1/48]
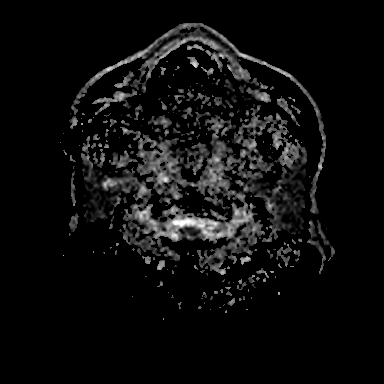
[im 24/48]
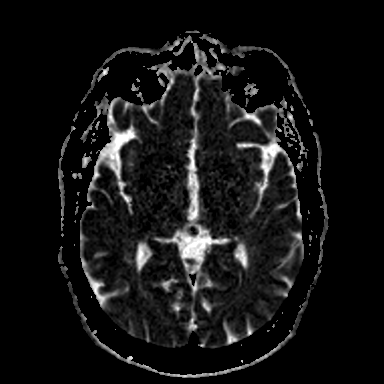
[im 48/48]
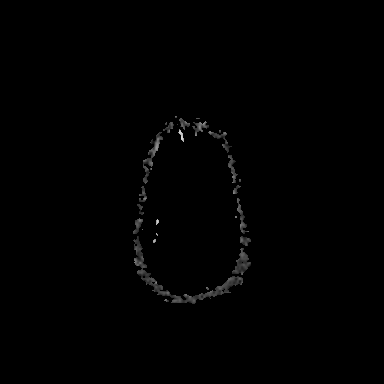

[Series 7: cor dwi_tracew · coronal · 5.0mm · 0.60mm/px · 2 of 36 slices shown]
[im 1/36]
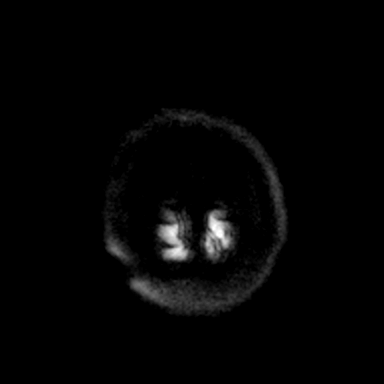
[im 36/36]
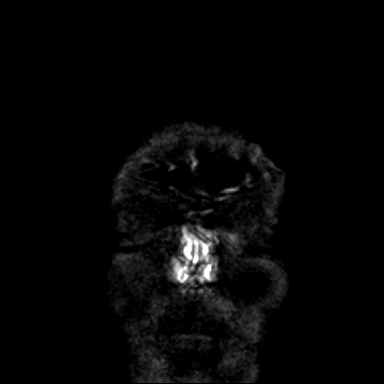

[Series 8: cor dwi_adc · coronal · 5.0mm · 0.60mm/px · 3 of 36 slices shown]
[im 1/36]
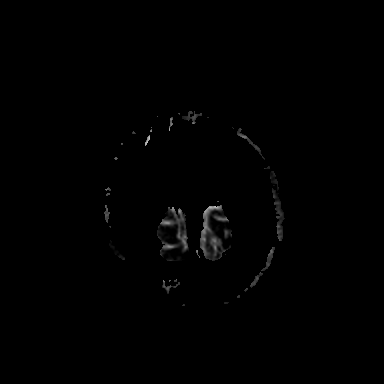
[im 18/36]
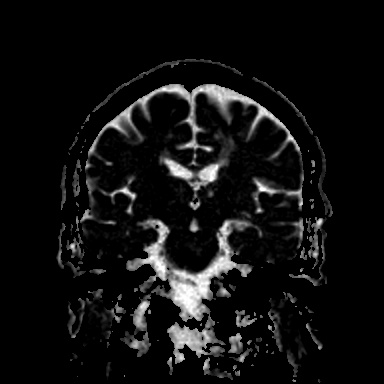
[im 36/36]
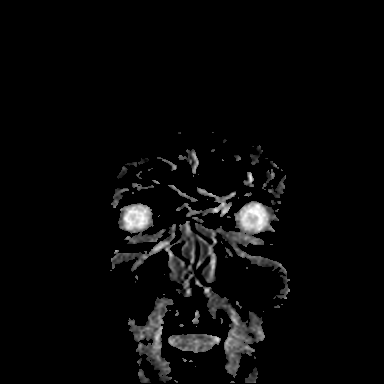

[Series 9: T1 · sagittal · 5.0mm · 0.62mm/px · 2 of 22 slices shown (1 of 2)]
[im 1/22]
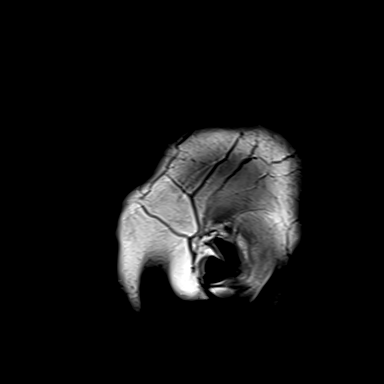
[im 22/22]
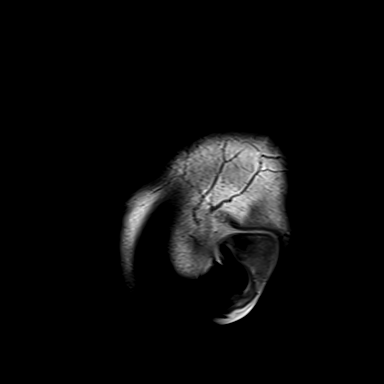

[Series 10: T2 · axial · 5.0mm · 0.53mm/px · z∈[-112,+31]mm · 2 of 25 slices shown (1 of 2)]
[im 1/25]
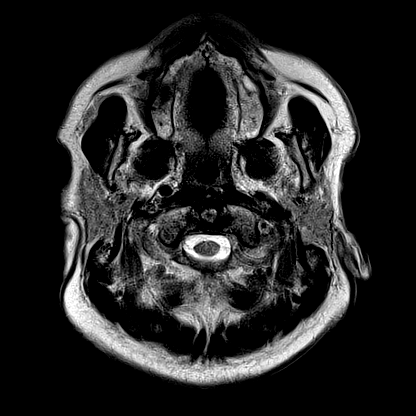
[im 25/25]
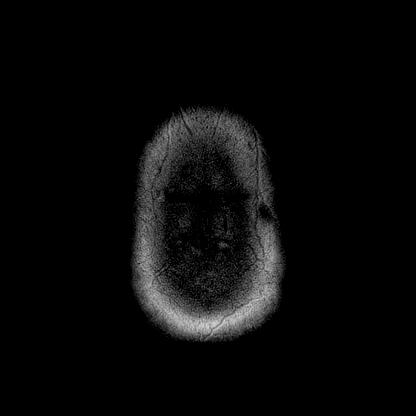

[Series 11: mag_images · axial · 3.0mm · 0.90mm/px · z∈[-128,+48]mm · 5 of 60 slices shown]
[im 1/60]
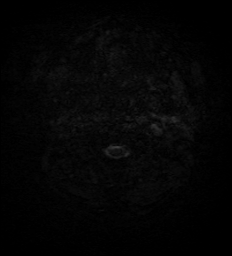
[im 15/60]
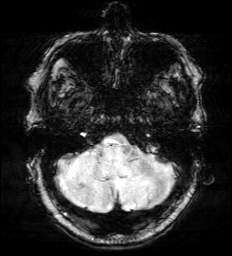
[im 30/60]
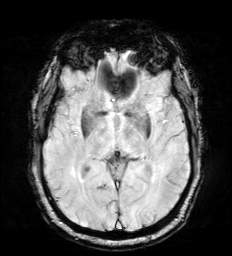
[im 45/60]
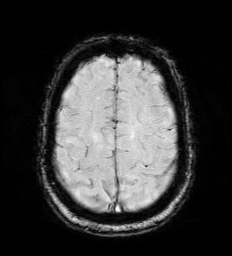
[im 60/60]
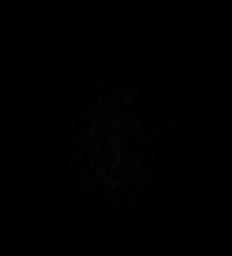

[Series 12: pha_images · axial · 3.0mm · 0.90mm/px · z∈[-128,+48]mm · 5 of 60 slices shown]
[im 1/60]
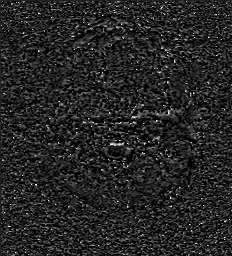
[im 15/60]
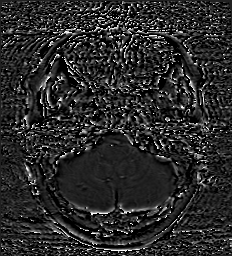
[im 30/60]
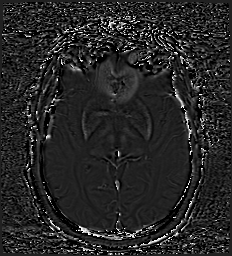
[im 45/60]
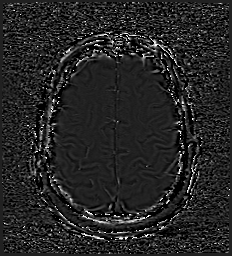
[im 60/60]
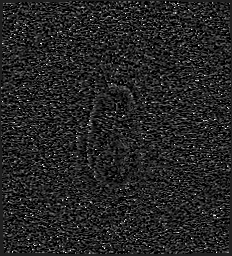

[Series 13: swi_images · axial · 3.0mm · 0.90mm/px · z∈[-128,+48]mm · 5 of 60 slices shown]
[im 1/60]
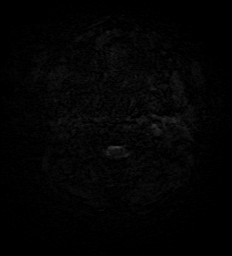
[im 15/60]
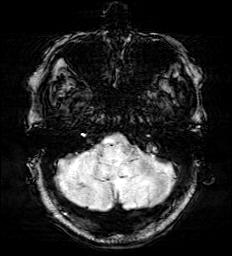
[im 30/60]
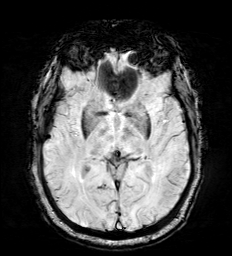
[im 45/60]
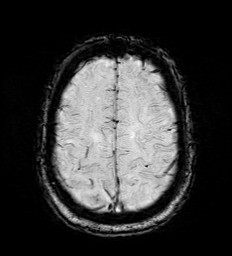
[im 60/60]
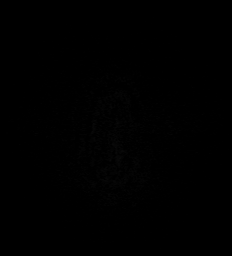

[Series 15: FLAIR · axial · 3.0mm · 0.53mm/px · z∈[-121,+40]mm · 4 of 55 slices shown]
[im 1/55]
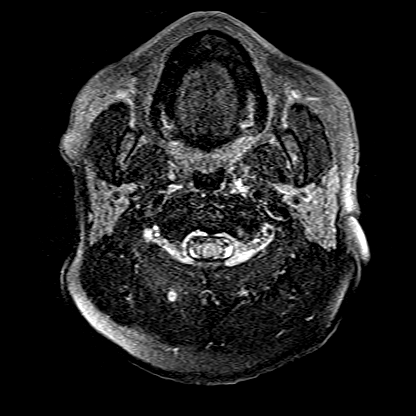
[im 19/55]
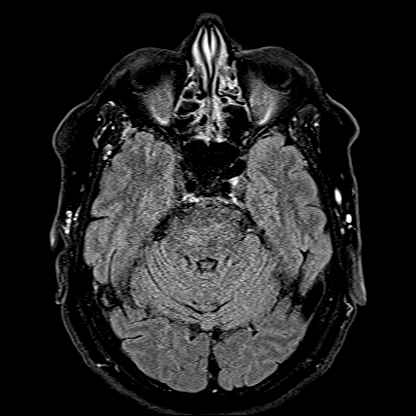
[im 37/55]
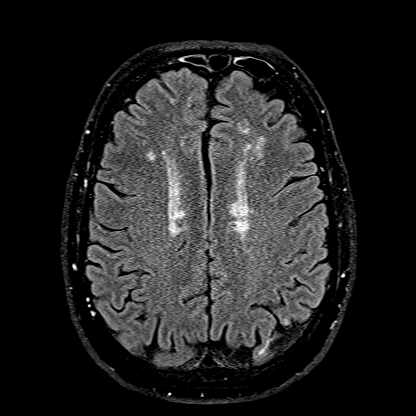
[im 55/55]
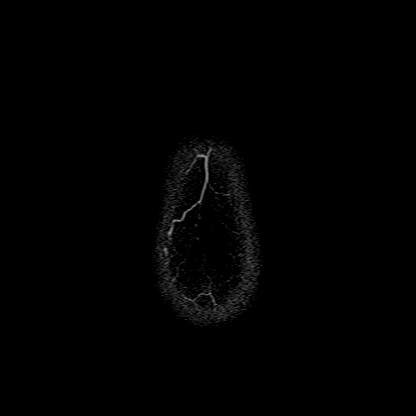

[Series 16: T1 · axial · 1.0mm · 0.98mm/px · z∈[-119,+40]mm · 8 of 160 slices shown (2 of 2)]
[im 1/160]
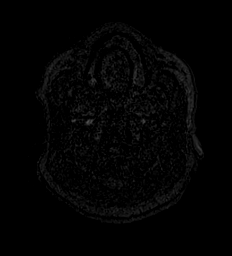
[im 29/160]
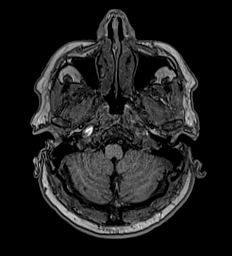
[im 44/160]
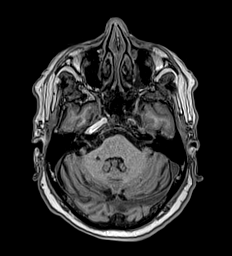
[im 73/160]
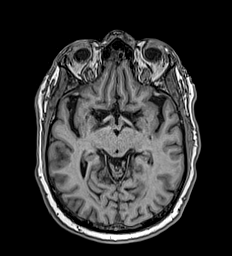
[im 87/160]
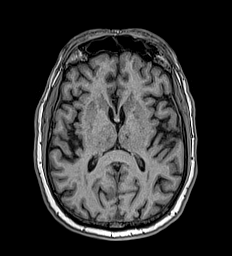
[im 116/160]
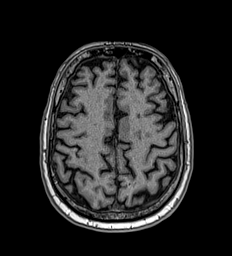
[im 131/160]
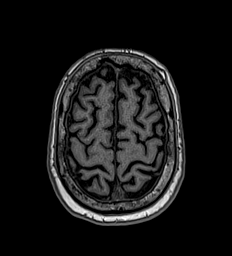
[im 160/160]
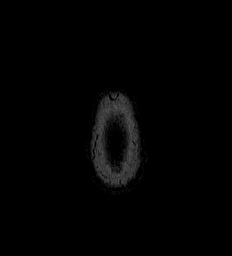

[Series 17: T2 · coronal · 5.0mm · 0.57mm/px · 2 of 27 slices shown (2 of 2)]
[im 1/27]
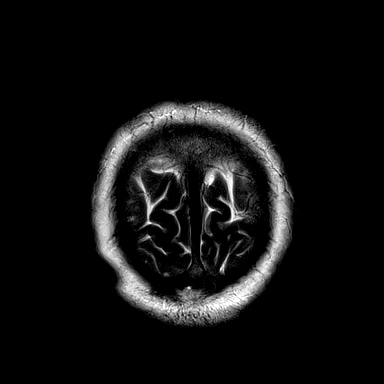
[im 27/27]
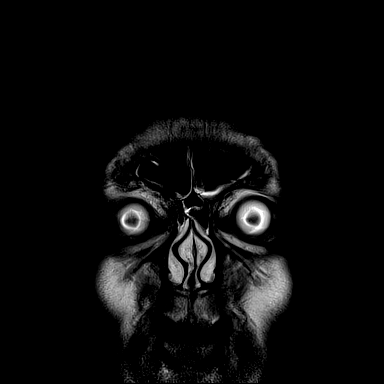

[44 of 48 positions shown; findings below may reference images not displayed]

FINDINGS: Brain:

There is patchy cortical/subcortical restricted diffusion within the
paramedian posterior right frontal lobe, within the right ACA
vascular territory, with acute/early subacute infarction. Both the
right superior frontal gyrus and medial right precentral gyrus are
involved. Corresponding T2/FLAIR hyperintensity at these sites.

Redemonstrated chronic cortically based infarct within the left
parietal lobe.

There are chronic lacunar infarcts within the left basal ganglia,
left thalamus, brainstem and bilateral cerebellar hemispheres.
Background mild patchy T2/FLAIR hyperintensity within the cerebral
white matter is nonspecific, but consistent with chronic small
vessel ischemic disease.

Mild generalized parenchymal atrophy.

No evidence of intracranial mass.

No chronic intracranial blood products.

No extra-axial fluid collection.

No midline shift.

Vascular: Expected proximal arterial flow voids.

Skull and upper cervical spine: No focal marrow lesion.

Sinuses/Orbits: Visualized orbits show no acute finding. Mild
paranasal sinus mucosal thickening, most notably within the left
maxillary sinus. No significant mastoid effusion.
IMPRESSION: Patchy cortical/subcortical acute/early subacute infarcts within the
paramedian posterior right frontal lobe, as described and within the
right ACA vascular territory.

Chronic cortically based left parietal lobe infarct.

Chronic lacunar infarcts within the left basal ganglia, left
thalamus, brainstem and cerebellum. Background mild chronic small
vessel ischemic disease within the cerebral white matter.

Mild generalized parenchymal atrophy.

Mild paranasal sinus mucosal thickening.

## 2019-07-08 IMAGING — CT CT HEAD W/O CM
3 series · 15 of 47 positions shown, 18 images · non-contrast
Comparison: None.

CLINICAL DATA: Recent development left-sided weakness and
subsequent fall.

EXAM:
CT HEAD WITHOUT CONTRAST
TECHNIQUE: Contiguous axial images were obtained from the base of the skull
through the vertex without intravenous contrast.

[Series 2: head wo · axial · 0.47mm/px · z∈[-140,-15]mm · 9 of 31 slices shown, 12 images]
[im 3/31  brain]
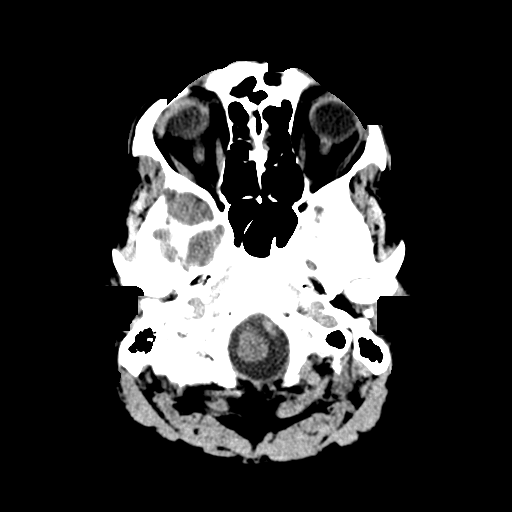
[im 3/31  bone]
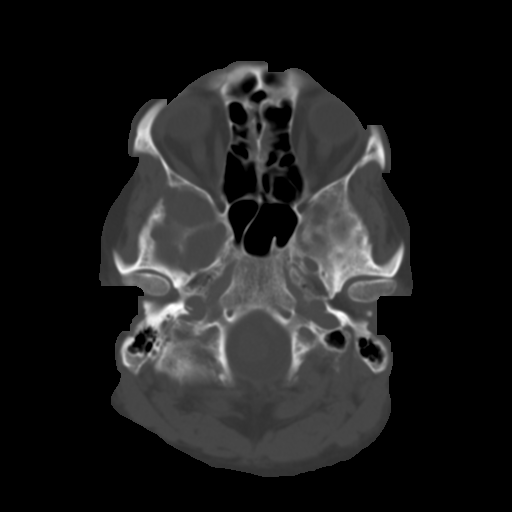
[im 6/31  brain]
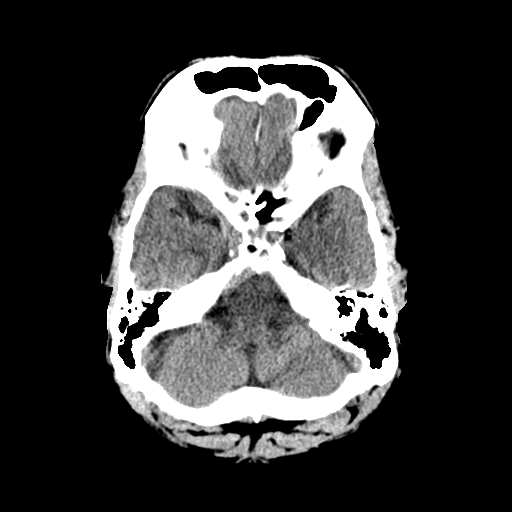
[im 9/31  brain]
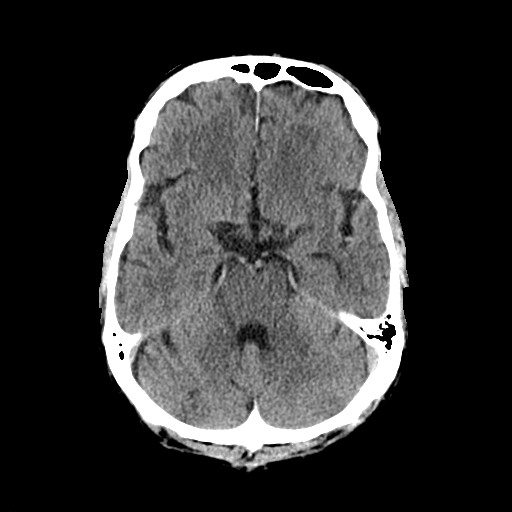
[im 12/31  brain]
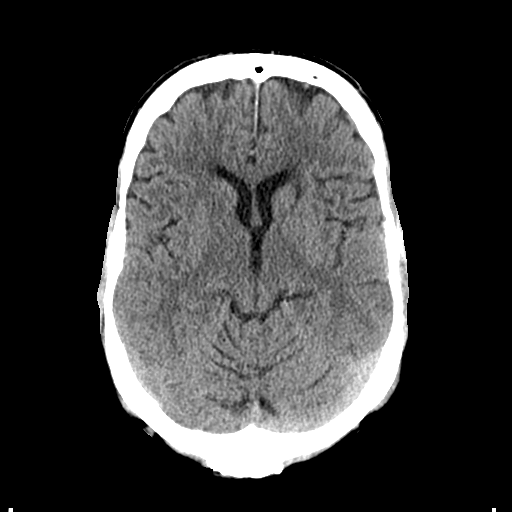
[im 16/31  brain]
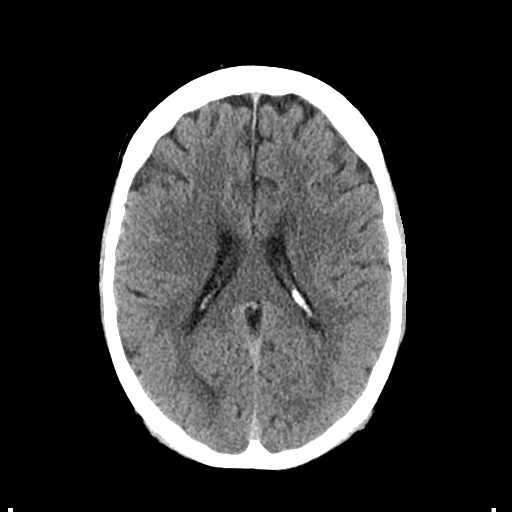
[im 16/31  bone]
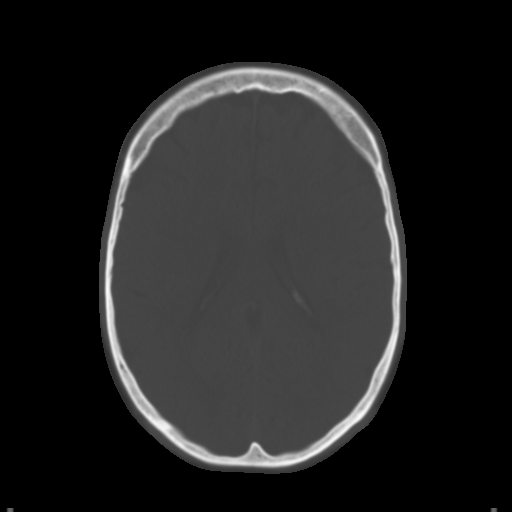
[im 19/31  brain]
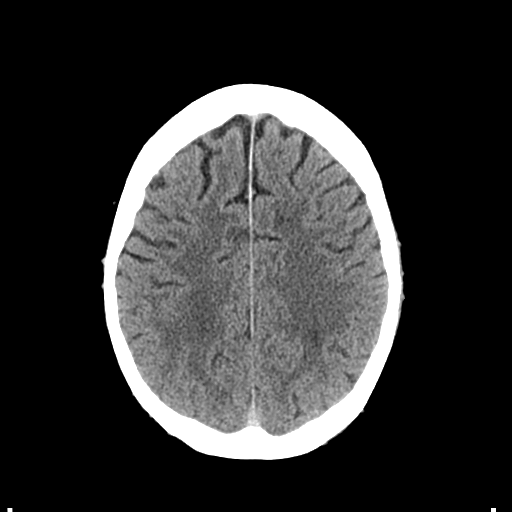
[im 22/31  brain]
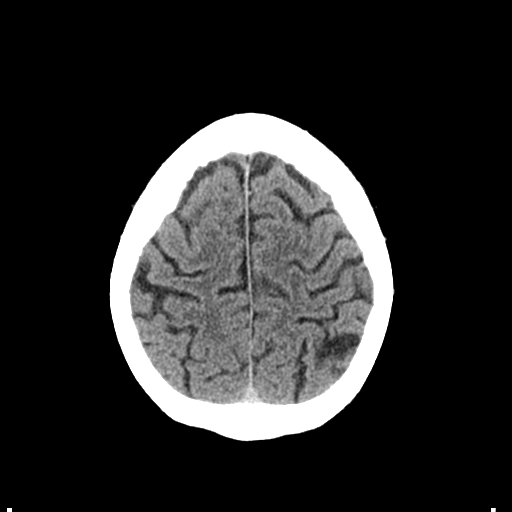
[im 25/31  brain]
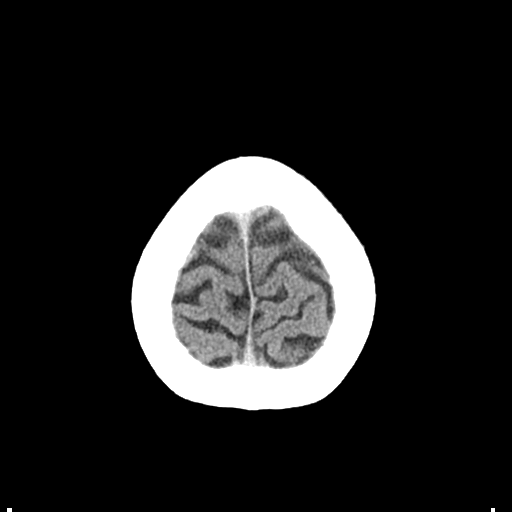
[im 28/31  brain]
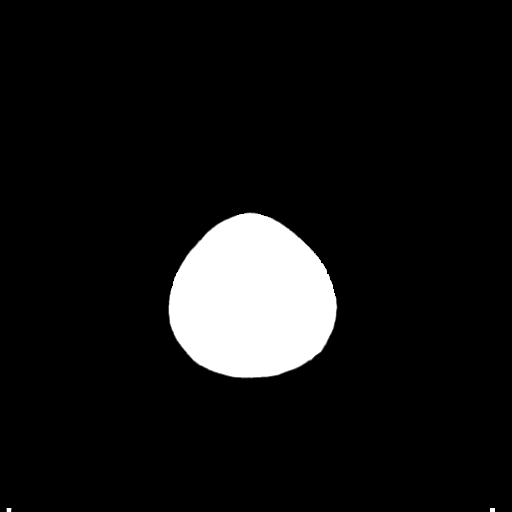
[im 28/31  bone]
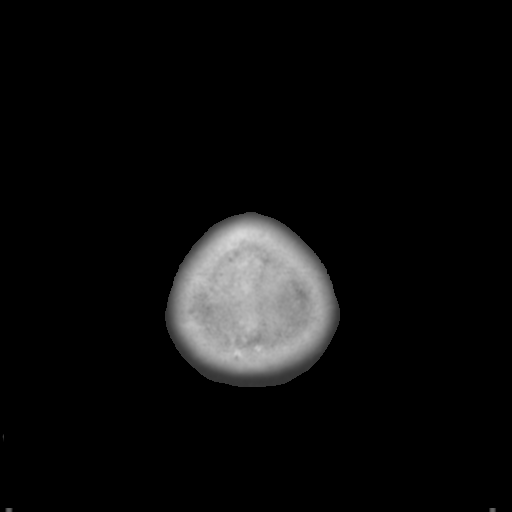

[Series 4: coronal soft tissue · coronal · 0.33mm/px · 3 of 72 slices shown]
[im 24/72  brain]
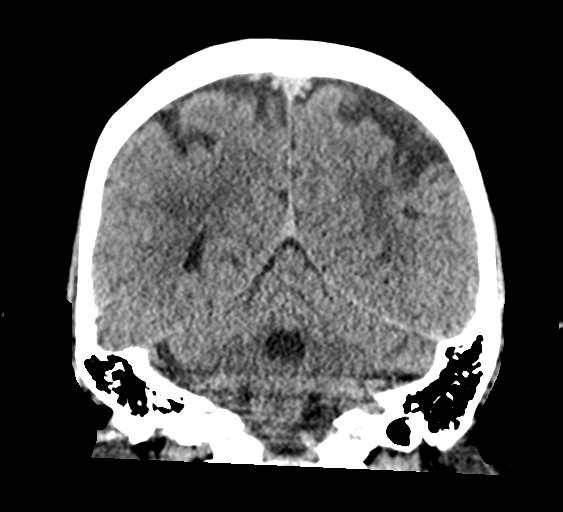
[im 32/72  brain]
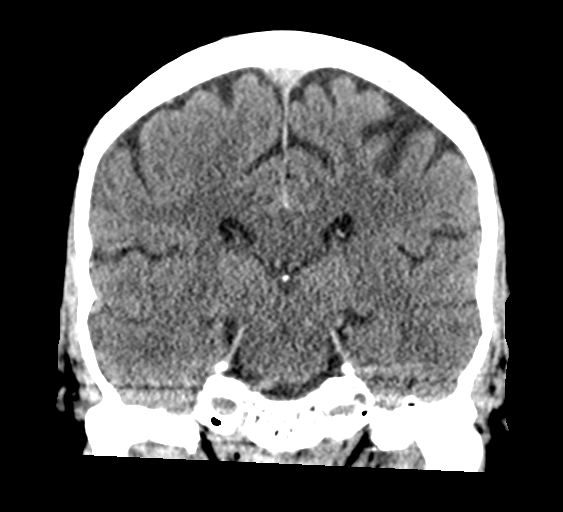
[im 40/72  brain]
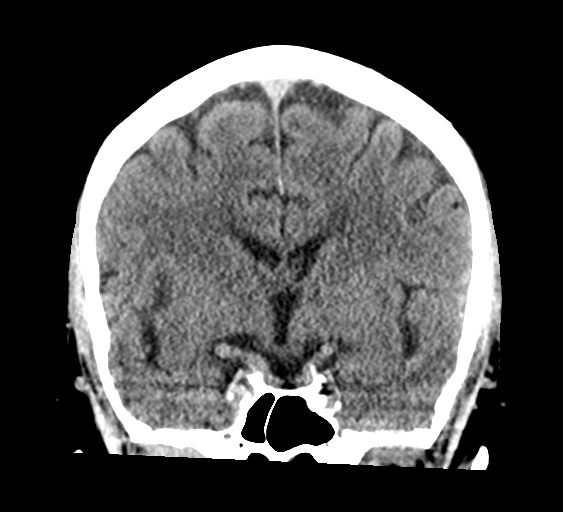

[Series 5: sagittal soft tissue · sagittal · 0.33mm/px · 3 of 63 slices shown]
[im 21/63  brain]
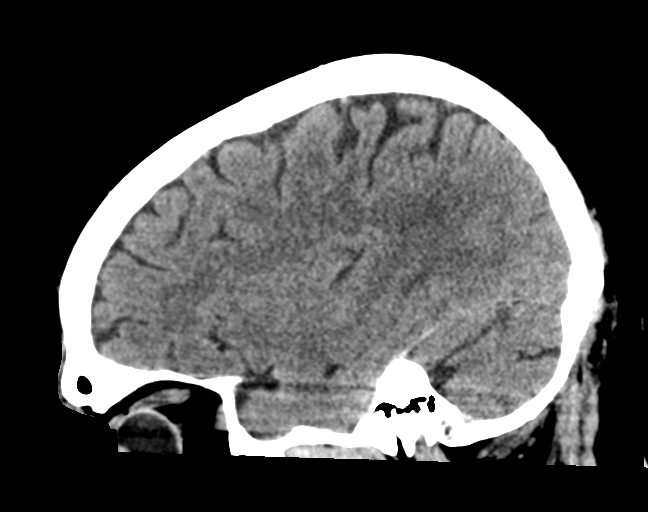
[im 32/63  brain]
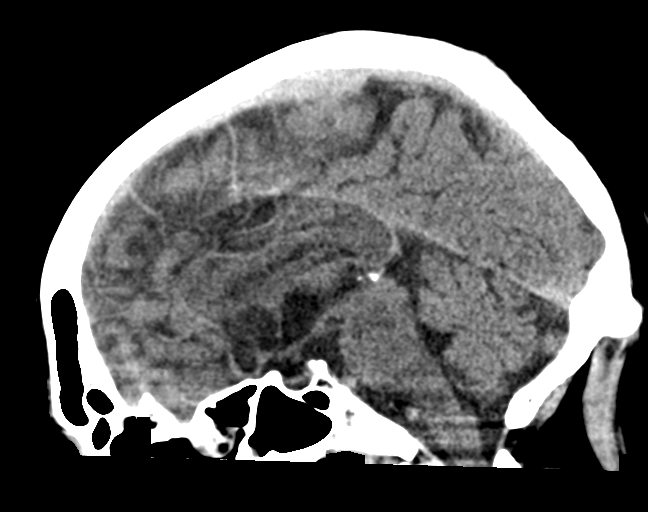
[im 42/63  brain]
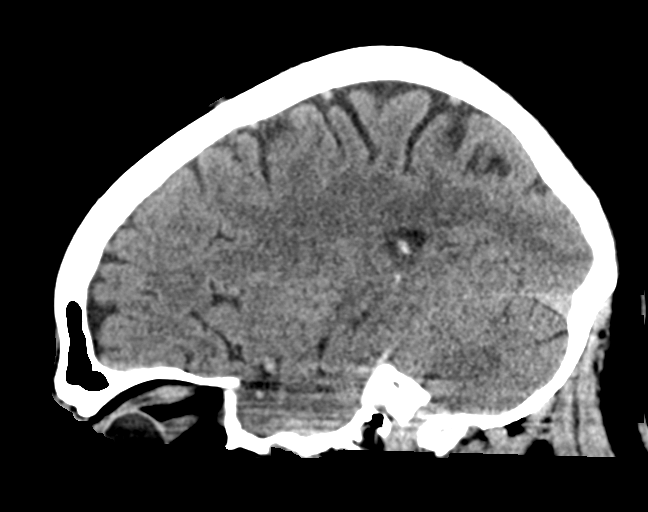

[15 of 47 positions shown; findings below may reference images not displayed]

FINDINGS: Brain: Brainstem appears normal. There is old infarction affecting
the right cerebellum. There is an old left parietal cortical
infarction. No sign of acute or subacute infarction, mass lesion,
hemorrhage, hydrocephalus or extra-axial collection.

Vascular: There is atherosclerotic calcification of the major
vessels at the base of the brain.

Skull: Negative

Sinuses/Orbits: Clear/normal

Other: None
IMPRESSION: No acute or subacute finding. Old infarction right cerebellum and
left parietal supratentorial brain.

## 2019-07-08 MED ORDER — SODIUM CHLORIDE 0.9% FLUSH: 3.0000 mL | Freq: Once | INTRAVENOUS | Status: DC

## 2019-07-08 NOTE — ED Triage Notes (Addendum)
Pt states he went to bed at his normal time Sunday night around 10pm and states he got up around 2:15am Monday morning to use the bathroom and fell due to left sided weakness. No facial droop noted, denies any HA or visual changes.

## 2019-07-08 NOTE — ED Notes (Signed)
Pt wanting to leave, encouraged to stay.  Pt still wants to leave.  Alert and oriented. Encouraged to call PCP in the AM and have them review his results and further evaluate pt and future plan of care.

## 2019-07-08 NOTE — ED Triage Notes (Signed)
First nurse note- pt here for left side weakness since 2 AM 07/07/19.  Ambulatory.

## 2019-07-08 NOTE — ED Notes (Signed)
In MRI

## 2019-07-10 ENCOUNTER — Other Ambulatory Visit: Payer: Self-pay | Admitting: Neurology

## 2019-07-10 DIAGNOSIS — I63421 Cerebral infarction due to embolism of right anterior cerebral artery: Secondary | ICD-10-CM

## 2019-07-23 ENCOUNTER — Other Ambulatory Visit: Payer: Medicare Other

## 2019-07-25 ENCOUNTER — Ambulatory Visit
Admission: RE | Admit: 2019-07-25 | Discharge: 2019-07-25 | Disposition: A | Discharge status: Home / Self Care | Payer: Medicare Other | Source: Ambulatory Visit | Attending: Neurology | Admitting: Neurology

## 2019-07-25 ENCOUNTER — Other Ambulatory Visit: Payer: Self-pay

## 2019-07-25 DIAGNOSIS — I63421 Cerebral infarction due to embolism of right anterior cerebral artery: Secondary | ICD-10-CM | POA: Diagnosis present

## 2019-07-25 IMAGING — MR MR MRA NECK W/O CM
1 series · 35 of 48 positions shown · non-contrast
Comparison: Comparison made with prior MRI from [DATE].

CLINICAL DATA: Follow-up examination for acute stroke, right ACA
distribution.

EXAM:
MRA NECK WITHOUT CONTRAST
MRA HEAD WITHOUT CONTRAST
TECHNIQUE: Multiplanar and multiecho pulse sequences of the neck were obtained
without intravenous contrast. Angiographic images of the neck were
obtained using MRA technique without and with intravenous contast.;
Angiographic images of the Circle of Willis were obtained using MRA
technique without intravenous contrast.

[Series 6: TOF · axial · 0.6mm · 0.52mm/px · z∈[-225,-129]mm · 35 of 172 slices shown]
[im 1/172]
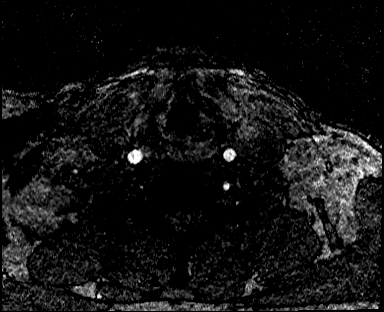
[im 4/172]
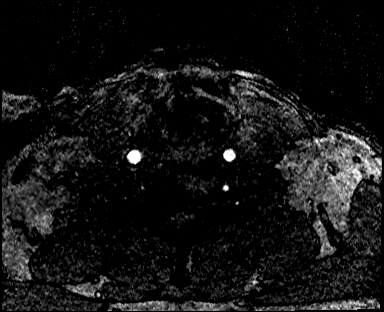
[im 8/172]
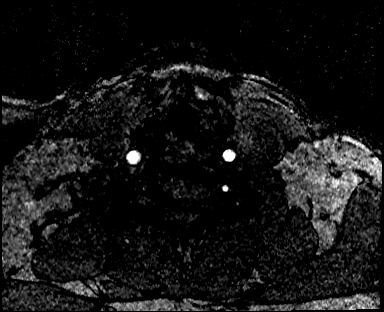
[im 11/172]
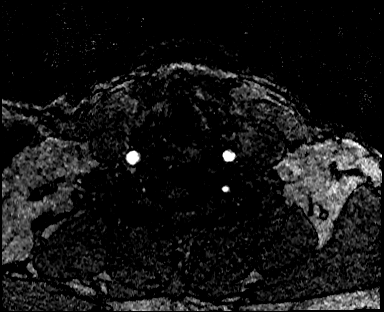
[im 15/172]
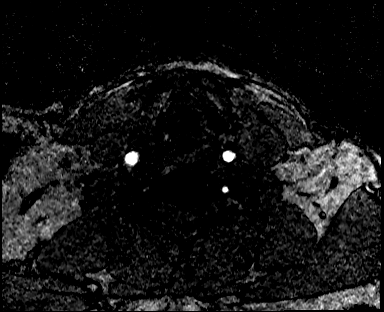
[im 19/172]
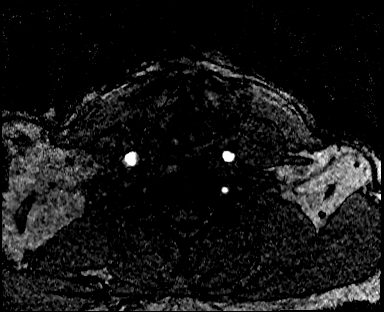
[im 22/172]
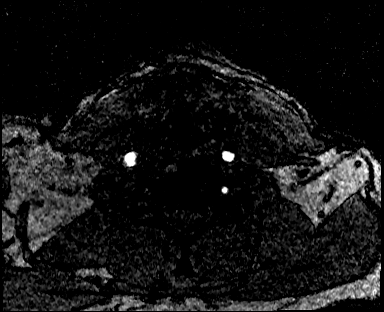
[im 26/172]
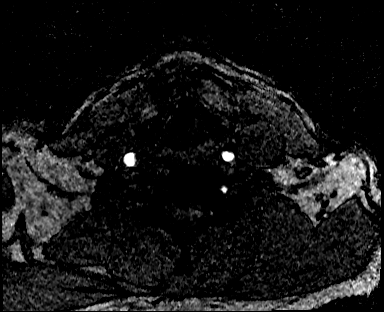
[im 30/172]
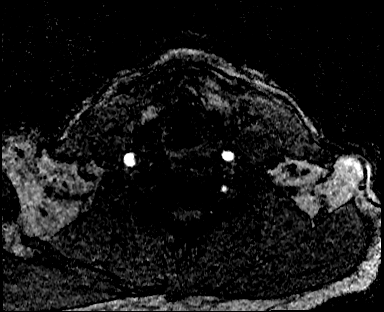
[im 33/172]
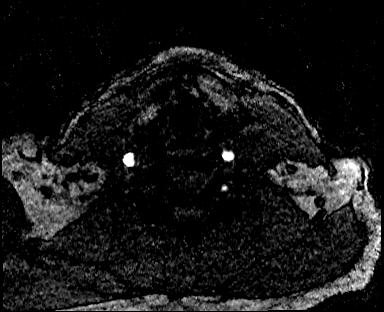
[im 37/172]
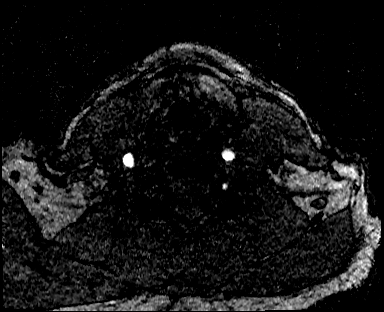
[im 41/172]
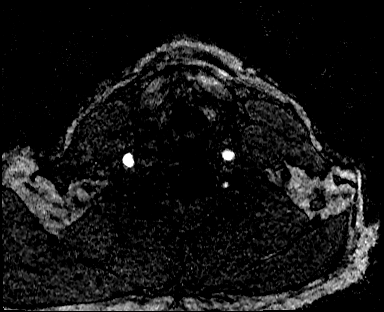
[im 44/172]
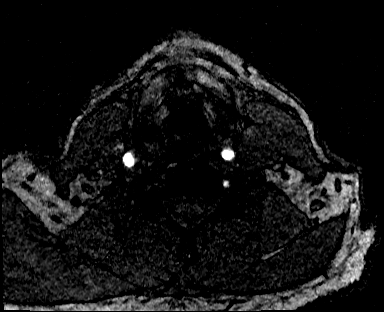
[im 48/172]
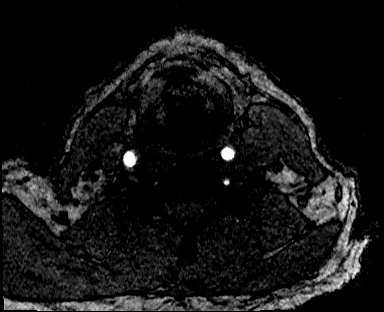
[im 51/172]
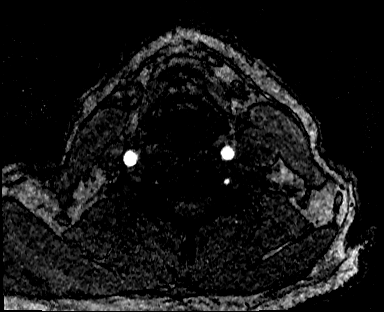
[im 55/172]
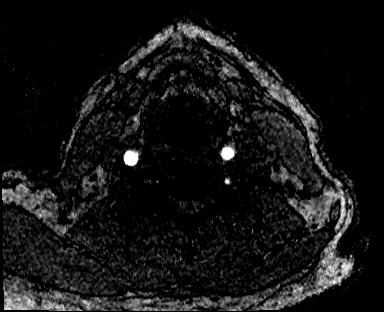
[im 59/172]
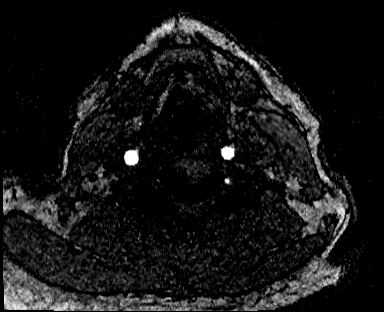
[im 62/172]
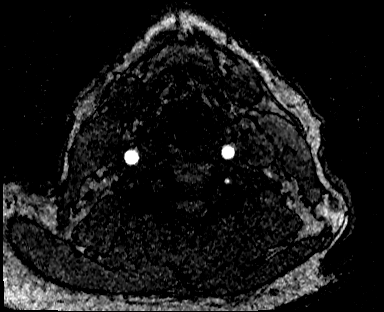
[im 66/172]
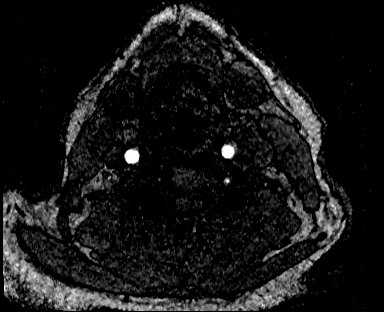
[im 70/172]
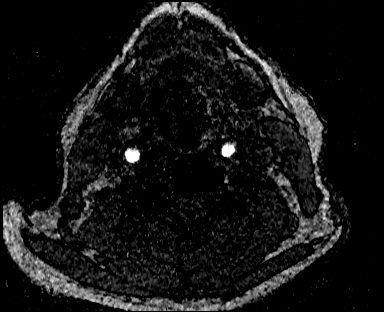
[im 73/172]
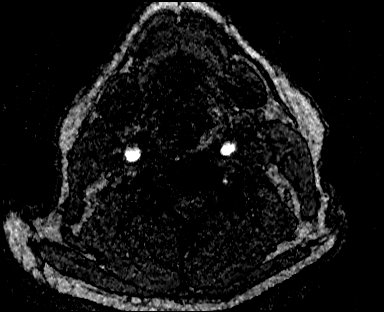
[im 77/172]
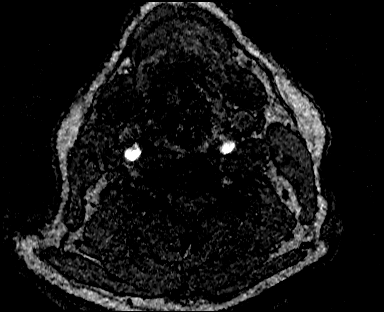
[im 81/172]
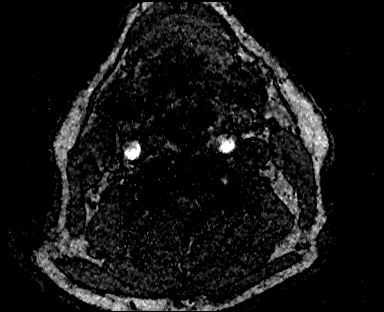
[im 84/172]
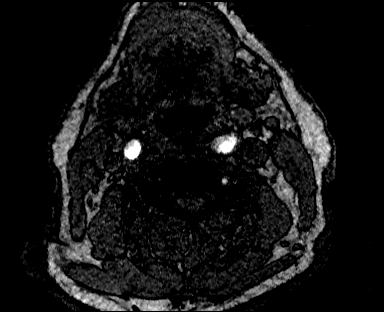
[im 88/172]
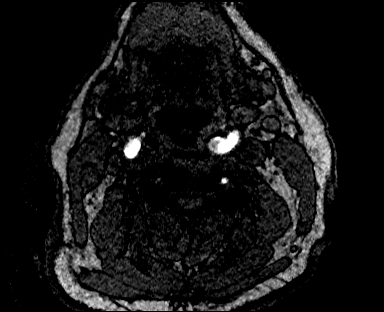
[im 91/172]
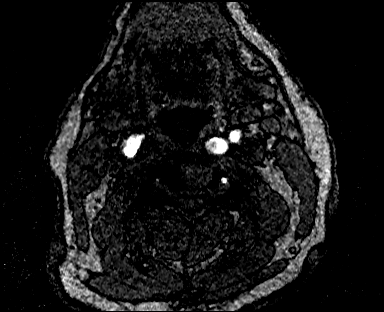
[im 95/172]
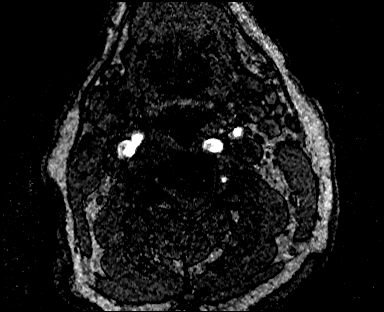
[im 99/172]
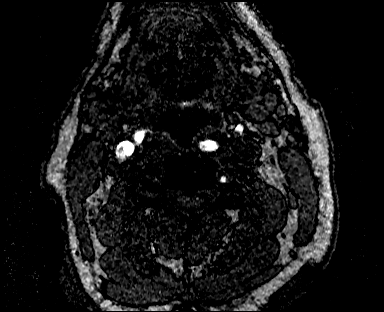
[im 102/172]
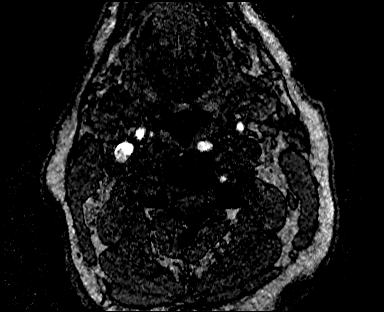
[im 106/172]
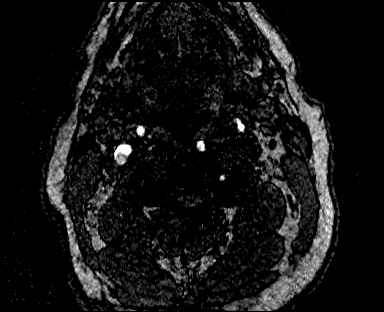
[im 110/172]
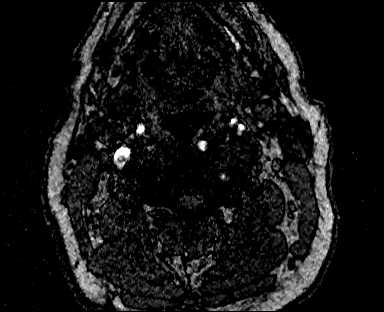
[im 121/172]
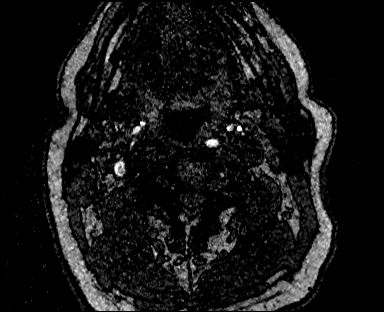
[im 142/172]
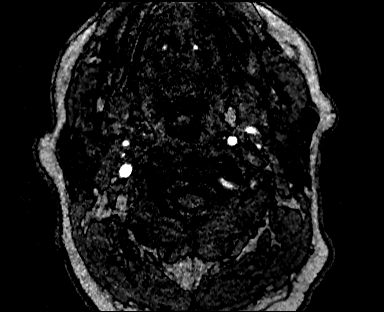
[im 146/172]
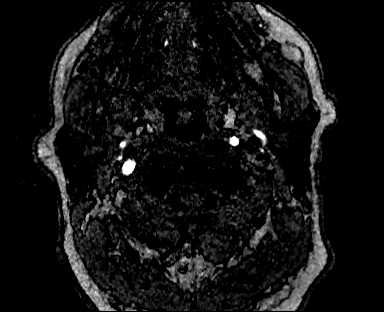
[im 164/172]
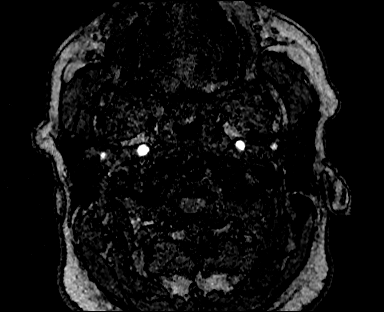

[35 of 48 positions shown; findings below may reference images not displayed]

FINDINGS: MRA NECK FINDINGS

AORTIC ARCH: Aortic arch and origin of the great vessels not
included on this exam.

RIGHT CAROTID SYSTEM: Probable mild eccentric plaque within the mid
right CCA with no more than mild 35% stenosis (series 6, image 40).
Visualized right CCA otherwise widely patent to the bifurcation. No
significant atheromatous narrowing about the right bifurcation.
Visualized right ICA widely patent distally to the skull base.

LEFT CAROTID SYSTEM: Mild eccentric plaque within the mid left CCA
with no more than mild 35% stenosis by NASCET criteria (series 6,
image 28). Visualized left CCA otherwise widely patent to the
bifurcation. No significant atheromatous narrowing about the left
bifurcation. Visualized left ICA widely patent to the skull base
without stenosis or occlusion.

VERTEBRAL ARTERIES: Origins of the vertebral arteries are not
visualized. Left vertebral artery is dominant, with a diffusely
hypoplastic right vertebral artery. Mild multifocal segmental
stenoses noted within the mid and distal left V2 segment without
high-grade stenosis. Left vertebral otherwise widely patent. Right
vertebral artery is only faintly visible, and appears to largely
occluded by the V3 segment.

MRA HEAD FINDINGS

ANTERIOR CIRCULATION:

Visualized distal cervical segments of the internal carotid arteries
are widely patent with antegrade flow. Petrous, cavernous, and
supraclinoid segments widely patent bilaterally without
hemodynamically significant stenosis or other abnormality. ICA
termini well perfused. A1 segments patent bilaterally. Left A1
hypoplastic, accounting for the slightly diminutive left ICA is
compared to the right. Normal anterior communicating artery complex.
Left ACA widely patent to its distal aspect without stenosis. There
is a short-segment severe distal right ACA stenosis (series 5, image
201), consistent with previously identified right ACA territory
infarct. This is only seen on source time-of-flight sequence, and
not included on 3D MIP reconstructions. Right ACA is perfused
distally.

No M1 stenosis or occlusion. Normal MCA bifurcations. Distal MCA
branches well perfused and symmetric.

POSTERIOR CIRCULATION:

Dominant left vertebral artery patent to the vertebrobasilar
junction without stenosis. Left PICA patent. Right vertebral
occluded at the cranial vault and not visualized. Right PICA not
visualized. Basilar is diffusely diminutive. There is an apparent
short-segment severe proximal basilar stenosis (series [XG], image
11). Basilar otherwise widely patent to its distal aspect. Superior
cerebral arteries patent bilaterally. Predominant fetal type origin
of both PCAs supplied via a robust and widely patent posterior
communicating arteries. Both PCAs well perfused to their distal
aspects.

Distal small vessel atheromatous irregularity noted throughout the
intracranial circulation.

No intracranial aneurysm or other vascular abnormality.
IMPRESSION: MRA NECK IMPRESSION:

1. Atheromatous change within the mid common carotid arteries
bilaterally with no more than mild 35% stenosis by NASCET criteria.
Both carotid artery systems otherwise widely patent within the neck.
2. Left vertebral artery dominant with associated mild multifocal
stenoses involving the mid and distal left V2 segment.
3. Markedly hypoplastic right vertebral artery, which essentially
occludes by the V3 segment.

MRA HEAD IMPRESSION:

1. Short-segment severe distal right ACA stenosis, in keeping with
the previously identified right ACA territory infarct. Please note
finding is only visible on source time-of-flight sequence, and is
not included on 3D MIP reconstructions.
2. Occluded right vertebral artery at the cranial vault. Dominant
left vertebral artery widely patent.
3. Short-segment severe proximal basilar stenosis. Predominant fetal
type origin of the PCAs with overall diminutive vertebrobasilar
system.
4. Diffuse distal small vessel atheromatous irregularity throughout
the intracranial circulation.

## 2019-07-25 IMAGING — MR MR MRA HEAD W/O CM
1 series · 17 of 48 positions shown · non-contrast
Comparison: Comparison made with prior MRI from [DATE].

CLINICAL DATA: Follow-up examination for acute stroke, right ACA
distribution.

EXAM:
MRA NECK WITHOUT CONTRAST
MRA HEAD WITHOUT CONTRAST
TECHNIQUE: Multiplanar and multiecho pulse sequences of the neck were obtained
without intravenous contrast. Angiographic images of the neck were
obtained using MRA technique without and with intravenous contast.;
Angiographic images of the Circle of Willis were obtained using MRA
technique without intravenous contrast.

[Series 5: TOF · axial · 0.5mm · 0.41mm/px · z∈[-97,-1]mm · 17 of 205 slices shown]
[im 1/205]
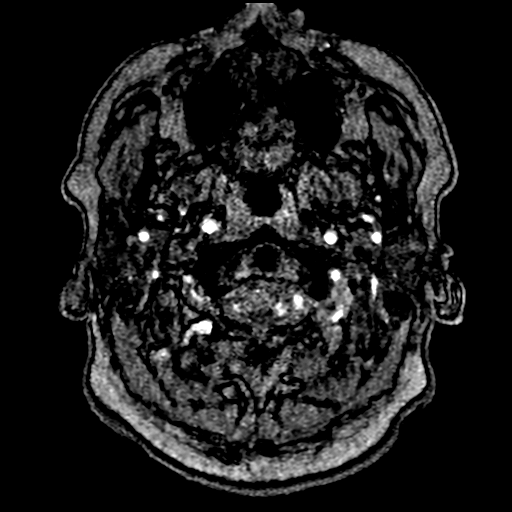
[im 5/205]
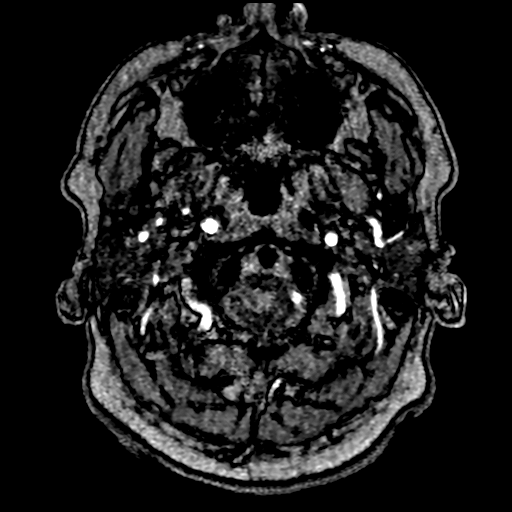
[im 9/205]
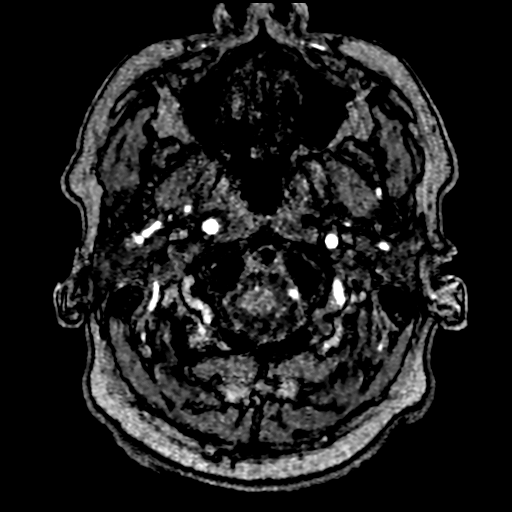
[im 14/205]
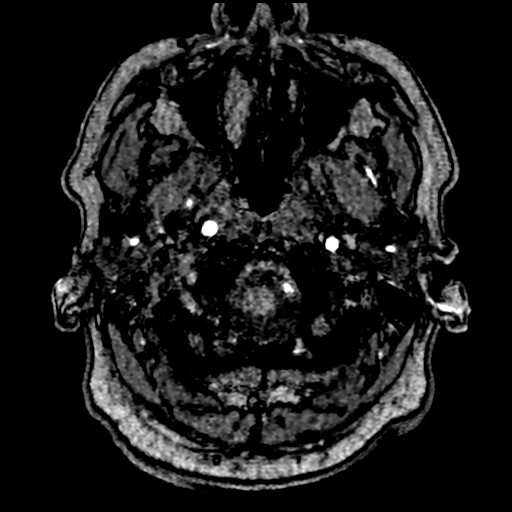
[im 18/205]
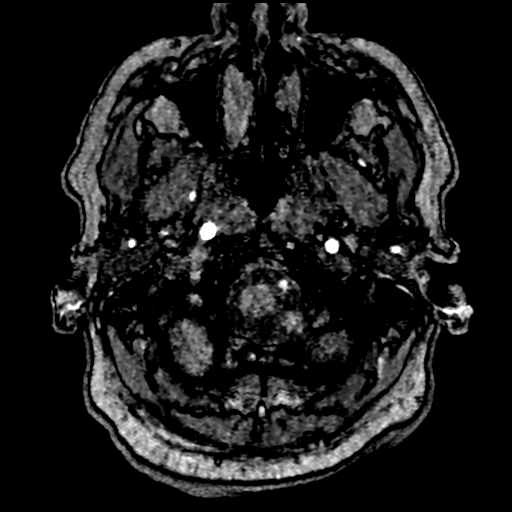
[im 22/205]
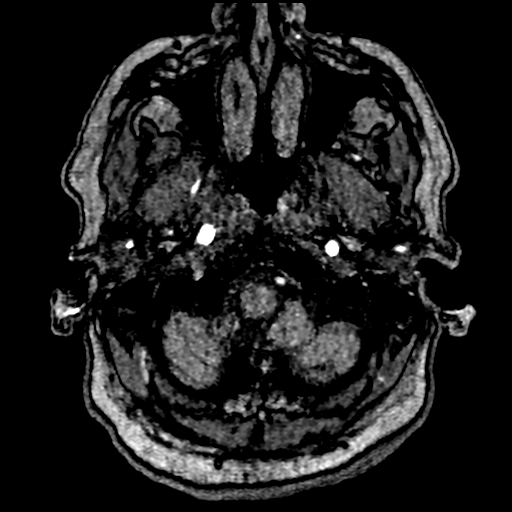
[im 27/205]
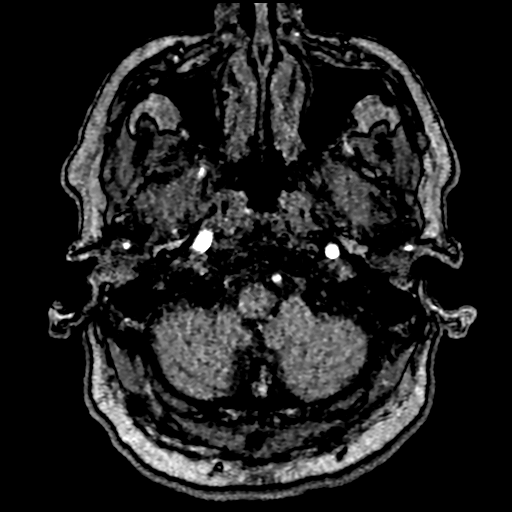
[im 35/205]
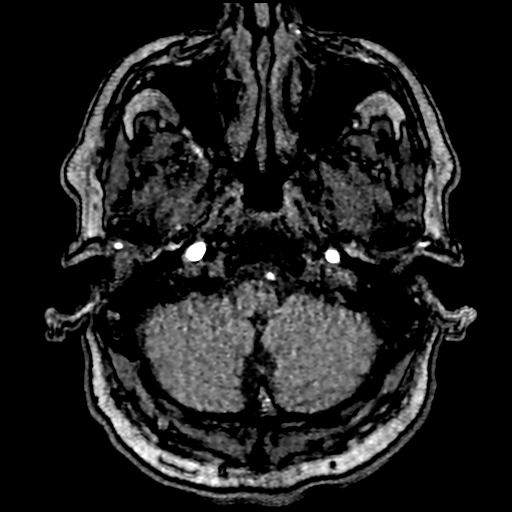
[im 40/205]
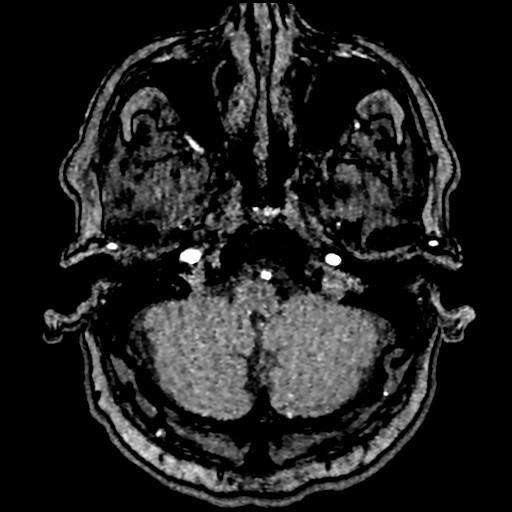
[im 66/205]
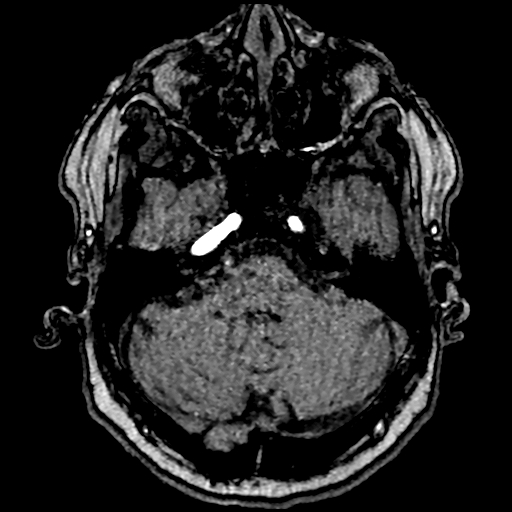
[im 92/205]
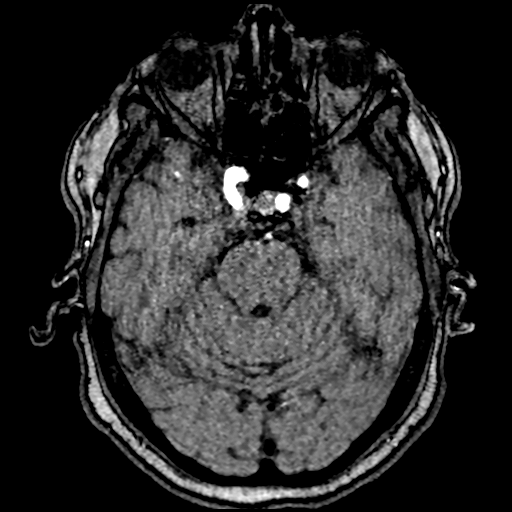
[im 105/205]
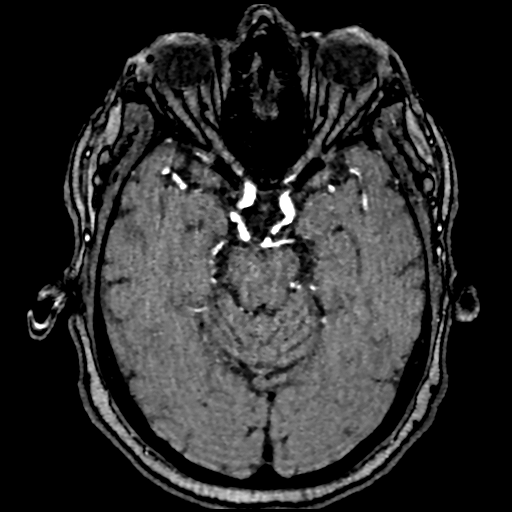
[im 118/205]
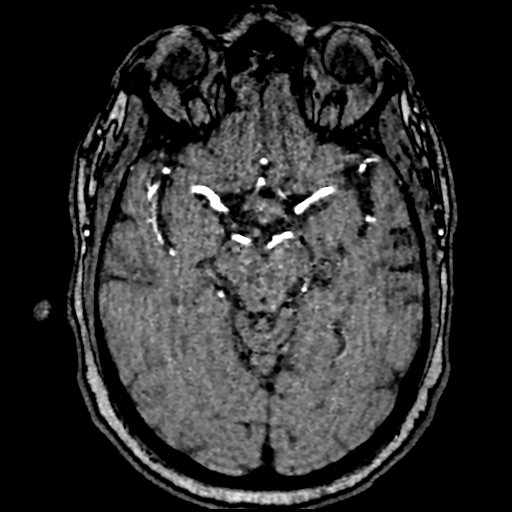
[im 144/205]
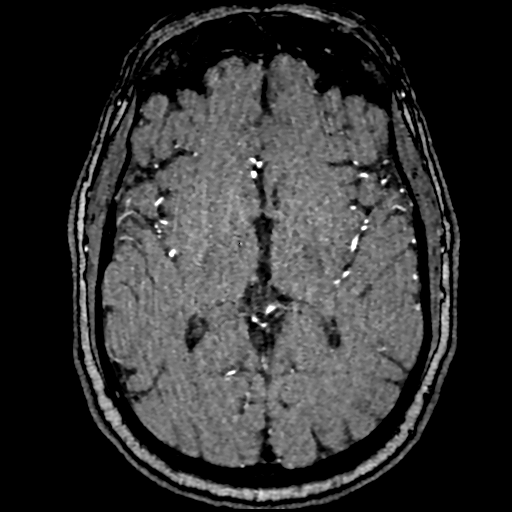
[im 170/205]
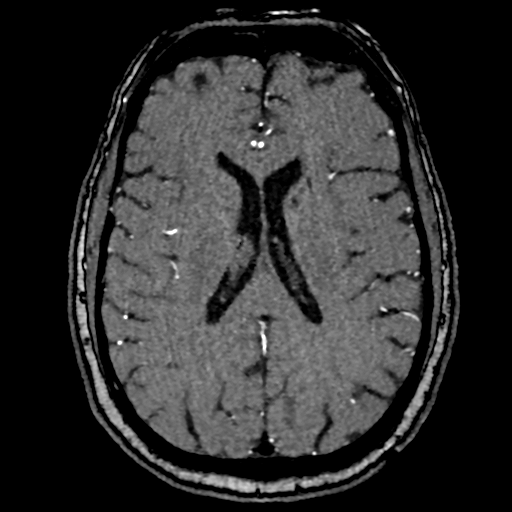
[im 174/205]
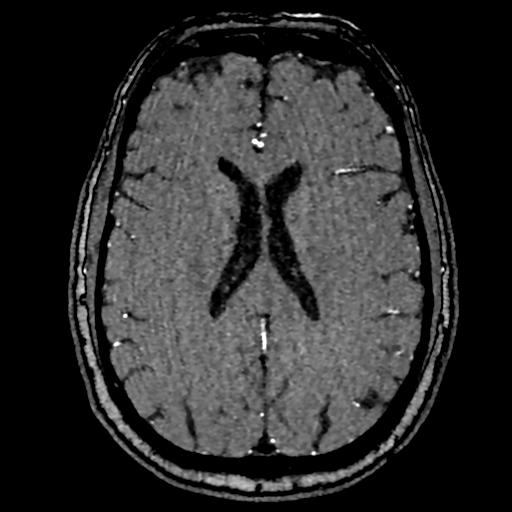
[im 196/205]
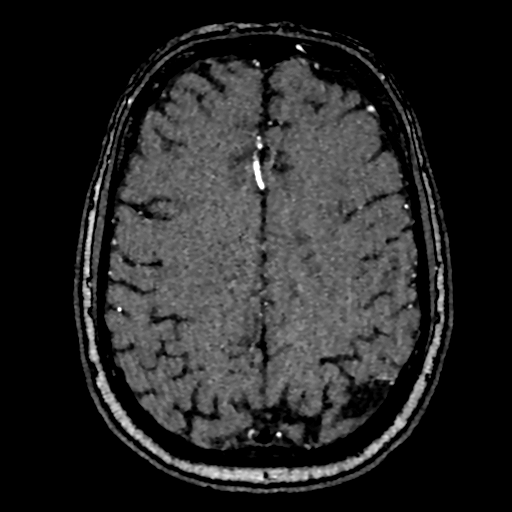

[17 of 48 positions shown; findings below may reference images not displayed]

FINDINGS: MRA NECK FINDINGS

AORTIC ARCH: Aortic arch and origin of the great vessels not
included on this exam.

RIGHT CAROTID SYSTEM: Probable mild eccentric plaque within the mid
right CCA with no more than mild 35% stenosis (series 6, image 40).
Visualized right CCA otherwise widely patent to the bifurcation. No
significant atheromatous narrowing about the right bifurcation.
Visualized right ICA widely patent distally to the skull base.

LEFT CAROTID SYSTEM: Mild eccentric plaque within the mid left CCA
with no more than mild 35% stenosis by NASCET criteria (series 6,
image 28). Visualized left CCA otherwise widely patent to the
bifurcation. No significant atheromatous narrowing about the left
bifurcation. Visualized left ICA widely patent to the skull base
without stenosis or occlusion.

VERTEBRAL ARTERIES: Origins of the vertebral arteries are not
visualized. Left vertebral artery is dominant, with a diffusely
hypoplastic right vertebral artery. Mild multifocal segmental
stenoses noted within the mid and distal left V2 segment without
high-grade stenosis. Left vertebral otherwise widely patent. Right
vertebral artery is only faintly visible, and appears to largely
occluded by the V3 segment.

MRA HEAD FINDINGS

ANTERIOR CIRCULATION:

Visualized distal cervical segments of the internal carotid arteries
are widely patent with antegrade flow. Petrous, cavernous, and
supraclinoid segments widely patent bilaterally without
hemodynamically significant stenosis or other abnormality. ICA
termini well perfused. A1 segments patent bilaterally. Left A1
hypoplastic, accounting for the slightly diminutive left ICA is
compared to the right. Normal anterior communicating artery complex.
Left ACA widely patent to its distal aspect without stenosis. There
is a short-segment severe distal right ACA stenosis (series 5, image
201), consistent with previously identified right ACA territory
infarct. This is only seen on source time-of-flight sequence, and
not included on 3D MIP reconstructions. Right ACA is perfused
distally.

No M1 stenosis or occlusion. Normal MCA bifurcations. Distal MCA
branches well perfused and symmetric.

POSTERIOR CIRCULATION:

Dominant left vertebral artery patent to the vertebrobasilar
junction without stenosis. Left PICA patent. Right vertebral
occluded at the cranial vault and not visualized. Right PICA not
visualized. Basilar is diffusely diminutive. There is an apparent
short-segment severe proximal basilar stenosis (series [XG], image
11). Basilar otherwise widely patent to its distal aspect. Superior
cerebral arteries patent bilaterally. Predominant fetal type origin
of both PCAs supplied via a robust and widely patent posterior
communicating arteries. Both PCAs well perfused to their distal
aspects.

Distal small vessel atheromatous irregularity noted throughout the
intracranial circulation.

No intracranial aneurysm or other vascular abnormality.
IMPRESSION: MRA NECK IMPRESSION:

1. Atheromatous change within the mid common carotid arteries
bilaterally with no more than mild 35% stenosis by NASCET criteria.
Both carotid artery systems otherwise widely patent within the neck.
2. Left vertebral artery dominant with associated mild multifocal
stenoses involving the mid and distal left V2 segment.
3. Markedly hypoplastic right vertebral artery, which essentially
occludes by the V3 segment.

MRA HEAD IMPRESSION:

1. Short-segment severe distal right ACA stenosis, in keeping with
the previously identified right ACA territory infarct. Please note
finding is only visible on source time-of-flight sequence, and is
not included on 3D MIP reconstructions.
2. Occluded right vertebral artery at the cranial vault. Dominant
left vertebral artery widely patent.
3. Short-segment severe proximal basilar stenosis. Predominant fetal
type origin of the PCAs with overall diminutive vertebrobasilar
system.
4. Diffuse distal small vessel atheromatous irregularity throughout
the intracranial circulation.

## 2019-07-30 ENCOUNTER — Other Ambulatory Visit: Payer: Self-pay

## 2019-07-30 ENCOUNTER — Ambulatory Visit: Payer: Medicare Other | Attending: Neurology

## 2019-07-30 DIAGNOSIS — M6281 Muscle weakness (generalized): Secondary | ICD-10-CM | POA: Insufficient documentation

## 2019-07-30 DIAGNOSIS — R2681 Unsteadiness on feet: Secondary | ICD-10-CM | POA: Diagnosis present

## 2019-07-30 DIAGNOSIS — R2689 Other abnormalities of gait and mobility: Secondary | ICD-10-CM | POA: Diagnosis present

## 2019-07-30 NOTE — Therapy (Signed)
[place] [address] [place], [place], [address] Phone: [phone]   Fax:  [phone]  Physical Therapy Evaluation  Patient Details  Name: Samuel Boyd MRN: [id number] Date of Birth: 08-24-1945 Referring Provider (PT): Jennings Books MD   Encounter Date: 07/30/2019   PT End of Session - 07/30/19 1743    Visit Number 1    Number of Visits 16    Date for PT Re-Evaluation 09/24/19    Authorization Type 1/10 eval 07/30/19    PT Start Time 0845    PT Stop Time 0934    PT Time Calculation (min) 49 min    Equipment Utilized During Treatment Gait belt    Activity Tolerance Patient tolerated treatment well    Behavior During Therapy [facility] for tasks assessed/performed           Past Medical History:  Diagnosis Date  . Arthritis   . Dysrhythmia   . History of kidney stones   . Hypertension   . Pneumonia     Past Surgical History:  Procedure Laterality Date  . CYSTOSCOPY/URETEROSCOPY/HOLMIUM LASER/STENT PLACEMENT Left 09/16/2018   Procedure: CYSTOSCOPY/URETEROSCOPY/HOLMIUM LASER/STENT PLACEMENT, LEFT;  Surgeon: Hollice Espy, MD;  Location: [facility] ORS;  Service: Urology;  Laterality: Left;  . INGUINAL HERNIA REPAIR Left 09/16/2018   Procedure: HERNIA REPAIR INGUINAL ADULT;  Surgeon: Herbert Pun, MD;  Location: [facility] ORS;  Service: General;  Laterality: Left;    There were no vitals filed for this visit.    Subjective Assessment - 07/30/19 0853    Subjective Patient is a pleasant 74 year old male who presents for CVA    Pertinent History Patient is a pleasant 74 year old male who presents for CVA. Patient had a R CVA on memorial day weekend May 30th (Sunday before memorial day). Woke up and left leg went out from underneath from him. Left leg and arm affected. Went to the next morning, waited for 6 hours in emergency room. Has had some stumbles but no falls.  Right Anterior Cerebral Artery distribution  embolic infract with previous history of lacunar strokes in right cerebellum and left caudate nucleus and left Middle Cerebral Artery posterior division embolic cortical infract. Has been keeping as active as possible and is improving.    Limitations Lifting;Standing;Walking;House hold activities    How long can you sit comfortably? n/a    How long can you stand comfortably? not limited    How long can you walk comfortably? 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    Patient Stated Goals get better foot control.    Currently in Pain? No/denies              [facility] PT Assessment - 07/30/19 0001      Assessment   Medical Diagnosis Right Anterior Cerebral Artery CVA    Referring Provider (PT) Jennings Books MD    Onset Date/Surgical Date 07/06/19    Hand Dominance Right    Prior Therapy no      Precautions   Precautions Fall      Restrictions   Weight Bearing Restrictions No      Balance Screen   Has the patient fallen in the past 6 months Yes    How many times? 1   when had the stroke  Has the patient had a decrease in activity level because of a fear of falling?  Yes    Is the patient reluctant to leave their home because of a fear of falling?  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    Vocation Requirements lifting, walking, heavy moving     [place]   Overall Cognitive Status Within Functional Limits for tasks assessed       Observation/Other Assessments   Cranial Nerve(s) appears intact with exception of upper trap       Standardized Balance Assessment   Standardized Balance Assessment Berg Balance Test      Berg Balance Test   Sit to Stand Able to stand  independently using hands    Standing Unsupported Able to stand safely 2 minutes    Sitting with Back Unsupported but Feet Supported on Floor or Stool Able to sit safely and securely 2 minutes    Stand to Sit Sits safely with minimal use of hands    Transfers Able to transfer safely, minor use of hands    Standing Unsupported with Eyes Closed Able to stand 10 seconds with supervision    Standing Unsupported with Feet Together Able to place feet together independently and stand for 1 minute with supervision    From Standing, Reach Forward with Outstretched Arm Can reach forward >12 cm safely (5")    From Standing Position, Pick up Object from Floor Able to pick up shoe, needs supervision    From Standing Position, Turn to Look Behind Over each Shoulder Looks behind one side only/other side shows less weight shift    Turn 360 Degrees Able to turn 360 degrees safely but slowly    Standing Unsupported, Alternately Place Feet on Step/Stool Able to complete 4 steps without aid or supervision    Standing Unsupported, One Foot in Front Able to take small step independently and hold 30 seconds    Standing on One Leg Tries to lift leg/unable to hold 3 seconds but remains standing independently    Total Score 41             .       PAIN: No pain.   POSTURE: Seated: weight shift onto R hip Standing: weight shift onto R side of body   AROM BLE:  Hip extension limited by 5 degrees -8 degrees dorsiflexion  PF WFL  STRENGTH:  Graded on a 0-5 scale Muscle Group Left Right  Hip Flex 3+/5 4+/5  Hip Abd 3+/5 4+/5  Hip Add 3/5 4+/5  Hip Ext 2+/5 4/5  Hip IR/ER 3/5 4/5  Knee Flex 2+/5 4+/5  Knee Ext 3+/5 4/5  Ankle DF 2+/5 4+/5  Ankle PF 3/5 4+/5     SENSATION:   BLE :  L decreased to light touch and spatial awareness   SOMATOSENSORY:  Any N & T in extremities or weakness: reports :         Sensation           Intact  Diminished         Absent  Light touch RLE LLE                             COORDINATION: Finger to Nose: Dysmetric LUE with high need for focus. Slow speed and moderate pass pointing ...  Heel shin test: dysmetric LLE   SPECIAL TESTS: Vision: - visual fields appear intact with no deficits -follows pen without difficulty   FUNCTIONAL MOBILITY: STS: weight shift onto RLE with RLE posterior to left  BALANCE: Dynamic Sitting Balance  Normal Able to sit unsupported and weight shift across midline maximally   Good Able to sit unsupported and weight shift across midline moderately x  Good-/Fair+ Able to sit unsupported and weight shift across midline minimally   Fair Minimal weight shifting ipsilateral/front, difficulty crossing midline   Fair- Reach to ipsilateral side and unable to weight shift   Poor + Able to sit unsupported with min A and reach to ipsilateral side, unable to weight shift   Poor Able to sit unsupported with mod A and reach ipsilateral/front-can't cross midline     Standing Dynamic Balance  Normal Stand independently unsupported, able to weight shift and cross midline maximally   Good Stand independently unsupported, able to weight shift and cross midline moderately   Good-/Fair+ Stand independently unsupported, able to weight shift across midline minimally   Fair Stand independently unsupported, weight shift, and reach ipsilaterally, loss of balance when crossing midline x  Poor+ Able to stand with Min A and reach ipsilaterally, unable to weight shift   Poor Able to stand with Mod A and minimally reach ipsilaterally, unable to cross midline.     Static Sitting Balance  Normal Able to maintain balance against maximal resistance   Good Able to maintain balance against moderate  resistance   Good-/Fair+ Accepts minimal resistance x  Fair Able to sit unsupported without balance loss and without UE support   Poor+ Able to maintain with Minimal assistance from individual or chair   Poor Unable to maintain balance-requires mod/max support from individual or chair     Static Standing Balance  Normal Able to maintain standing balance against maximal resistance   Good Able to maintain standing balance against moderate resistance   Good-/Fair+ Able to maintain standing balance against minimal resistance   Fair Able to stand unsupported without UE support and without LOB for 1-2 min x  Fair- Requires Min A and UE support to maintain standing without loss of balance   Poor+ Requires mod A and UE support to maintain standing without loss of balance   Poor Requires max A and UE support to maintain standing balance without loss       GAIT:  Patient ambulates without an AD. He has limited weight acceptance/weight shift onto LLE with limited heel strike/dorsiflexion that degrades with fatigue.   OUTCOME MEASURES: TEST Outcome Interpretation  5 times sit<>stand 16.47 sec SUE support  >66 yo, >15 sec indicates increased risk for falls  10 meter walk test    14.03 seconds             m/s <1.0 m/s indicates increased risk for falls; limited community ambulator  FOTO 56% Discharge score recommendation 69%   6 minute walk test       855 ft no AD         Feet 1000 feet is community Conservator, museum/gallery Assessment  41/56 <  36/56 (100% risk for falls), 37-45 (80% risk for falls); 46-51 (>50% risk for falls); 52-55 (lower risk <25% of falls)         Access Code: [email or web address] URL: [email or web address] Date: 07/30/2019 Prepared by: Janna Arch  Exercises Toe Raises with Counter Support - 1 x daily - 7 x weekly - 2 sets - 10 reps - 5 hold Standing Hamstring Curl with Chair Support - 1 x daily - 7 x weekly - 2 sets - 10 reps - 5 hold Seated Ankle Alphabet - 1 x  daily - 7 x weekly - 2 sets - 10 reps - 5 hold Standing Toe Taps - 1 x daily - 7 x weekly - 2 sets - 10 reps - 5 hold Single Leg Balance Raising Opposite Arm and Leg - 1 x daily - 7 x weekly - 2 sets - 10 reps - 5 hold    Objective measurements completed on examination: See above findings.            PT Education - 07/30/19 1743    Education Details goals, POC, HEP    Person(s) Educated Patient    Methods Explanation;Demonstration;Tactile cues;Verbal cues;Handout    Comprehension Verbalized understanding;Returned demonstration;Verbal cues required;Tactile cues required            PT Short Term Goals - 07/30/19 1750      PT SHORT TERM GOAL #1   Title Patient will be independent in home exercise program to improve strength/mobility for better functional independence with ADLs.    Baseline 07/30/19: HEP given    Time 4    Period Weeks    Status New    Target Date 08/27/19      PT SHORT TERM GOAL #2   Title Patient (> 59 years old) will complete five times sit to stand test in < 15 seconds indicating an increased LE strength and improved balance.    Baseline 6/23: 16.47 sec with SUE support    Time 4    Period Weeks    Status New    Target Date 08/27/19             PT Long Term Goals - 07/30/19 1751      PT LONG TERM GOAL #1   Title Patient will increase FOTO score to equal to or greater than  69%   to demonstrate statistically significant improvement in mobility and quality of life.    Baseline 6/23: 56%    Time 8    Period Weeks    Status New    Target Date 09/24/19      PT LONG TERM GOAL #2   Title Patient will increase 10 meter walk test to >1.31m/s as to improve gait speed for better community ambulation and to reduce fall risk.    Baseline 0.71 m/s    Time 8    Period Weeks    Status New    Target Date 09/24/19      PT LONG TERM GOAL #3   Title Patient will increase six minute walk test distance to >1000 for progression to community ambulator and  improve gait ability    Baseline 6/23: 855 ft    Time 8    Period Weeks    Status New    Target Date 09/24/19      PT LONG TERM GOAL #4   Title Patient will increase BLE gross strength to 4+/5 as to improve functional strength for independent gait, increased standing tolerance and increased  ADL ability.    Baseline 6/23: see document    Time 8    Period Weeks    Status New    Target Date 09/24/19                  Plan - 07/30/19 1747    Clinical Impression Statement Patient is a very pleasant 74 year old male s/p CVA resulting in left sided weakness of UE's and LE's. Will request a referral for OT for UE deficits. Patient demonstrates limited spatial awareness, coordination, and strength of LLE with primary deficit in ankle and posterior chain musculature. He is very motivated and has excellent potential. His daily life requires high level of function and needs to be able to negotiate unstable surfaces which he currently is unable to do.  Patient would benefit from skilled physical therapy to increase strength, stability, and capacity for functional mobility.    Personal Factors and Comorbidities Age;Finances;Comorbidity 2;Profession;Time since onset of injury/illness/exacerbation    Comorbidities arthritis, HTN    Examination-Activity Limitations Caring for Others;Bend;Carry;Dressing;Hygiene/Grooming;Lift;Locomotion Level;Reach Overhead;Squat;Stairs;Stand;Transfers    Examination-Participation Restrictions Church;Cleaning;Community Activity;Laundry;Volunteer;Shop;Meal Prep;Yard Work    Merchant navy officer Evolving/Moderate complexity    Clinical Decision Making Moderate    Rehab Potential Fair    PT Frequency 2x / week    PT Duration 8 weeks    PT Treatment/Interventions ADLs/Self Care Home Management;Aquatic Therapy;Biofeedback;Cryotherapy;Electrical Stimulation;Iontophoresis 4mg /ml Dexamethasone;Canalith Repostioning;Moist Heat;Traction;Ultrasound;DME  Instruction;Gait training;Stair training;Functional mobility training;Therapeutic activities;Patient/family education;Cognitive remediation;Neuromuscular re-education;Balance training;Therapeutic exercise;Orthotic Fit/Training;Manual techniques;Splinting;Energy conservation;Dry needling;Passive range of motion;Taping;Vasopneumatic Device;Vestibular    PT Next Visit Plan LLE strengthening, coordination, ankle strategies    PT Home Exercise Plan see above    Recommended Other Services OT    Consulted and Agree with Plan of Care Patient           Patient will benefit from skilled therapeutic intervention in order to improve the following deficits and impairments:  Abnormal gait, Cardiopulmonary status limiting activity, Decreased activity tolerance, Decreased balance, Decreased endurance, Decreased coordination, Decreased knowledge of use of DME, Decreased mobility, Difficulty walking, Decreased strength, Impaired flexibility, Impaired perceived functional ability, Impaired sensation, Impaired UE functional use, Postural dysfunction, Improper body mechanics  Visit Diagnosis: Unsteadiness on feet  Muscle weakness (generalized)  Other abnormalities of gait and mobility     Problem List Patient Active Problem List   Diagnosis Date Noted  . Borderline diabetes mellitus 09/11/2018  . Hyperlipemia, mixed 09/11/2018  . Tobacco dependence 09/11/2018  . Abdominal aortic aneurysm (AAA) without rupture ([place]) 06/11/2018  . Chronic systolic CHF (congestive heart failure), NYHA class 2 ([place]) 10/30/2016  . Paroxysmal A-fib ([place]) 03/10/2015  . Benign essential hypertension 08/27/2014   Janna Arch, PT, DPT   07/30/2019, 5:57 PM  [place] [facility] [address] [place], [place], [address] Phone: [phone]   Fax:  [phone]  Name: Samuel Boyd MRN: [id number] Date of Birth: [birth year]

## 2019-07-30 NOTE — Patient Instructions (Signed)
Access Code: [email or web address] URL: [email or web address] Date: 07/30/2019 Prepared by: Janna Arch  Exercises Toe Raises with Counter Support - 1 x daily - 7 x weekly - 2 sets - 10 reps - 5 hold Standing Hamstring Curl with Chair Support - 1 x daily - 7 x weekly - 2 sets - 10 reps - 5 hold Seated Ankle Alphabet - 1 x daily - 7 x weekly - 2 sets - 10 reps - 5 hold Standing Toe Taps - 1 x daily - 7 x weekly - 2 sets - 10 reps - 5 hold Single Leg Balance Raising Opposite Arm and Leg - 1 x daily - 7 x weekly - 2 sets - 10 reps - 5 hold

## 2019-08-05 ENCOUNTER — Ambulatory Visit: Payer: Medicare Other | Admitting: Physical Therapy

## 2019-08-05 ENCOUNTER — Other Ambulatory Visit: Payer: Self-pay

## 2019-08-05 ENCOUNTER — Encounter: Payer: Self-pay | Admitting: Physical Therapy

## 2019-08-05 DIAGNOSIS — R2689 Other abnormalities of gait and mobility: Secondary | ICD-10-CM

## 2019-08-05 DIAGNOSIS — M6281 Muscle weakness (generalized): Secondary | ICD-10-CM

## 2019-08-05 DIAGNOSIS — R2681 Unsteadiness on feet: Secondary | ICD-10-CM

## 2019-08-05 NOTE — Therapy (Signed)
[place] MAIN [facility] SERVICES [address] [place], [place], [address] Phone: [phone]   Fax:  [phone]  Physical Therapy Treatment  Patient Details  Name: Samuel Boyd MRN: [id number] Date of Birth: 1945/04/07  Referring Provider (PT): Jennings Books MD   Encounter Date: 08/05/2019   PT End of Session - 08/05/19 0845    Visit Number 2    Number of Visits 16    Date for PT Re-Evaluation 09/24/19    Authorization Type 1/10 eval 07/30/19    PT Start Time 0845    PT Stop Time 0930    PT Time Calculation (min) 45 min    Equipment Utilized During Treatment Gait belt    Activity Tolerance Patient tolerated treatment well    Behavior During Therapy [facility] for tasks assessed/performed           Past Medical History:  Diagnosis Date  . Arthritis   . Dysrhythmia   . History of kidney stones   . Hypertension   . Pneumonia     Past Surgical History:  Procedure Laterality Date  . CYSTOSCOPY/URETEROSCOPY/HOLMIUM LASER/STENT PLACEMENT Left 09/16/2018   Procedure: CYSTOSCOPY/URETEROSCOPY/HOLMIUM LASER/STENT PLACEMENT, LEFT;  Surgeon: Hollice Espy, MD;  Location: [facility] ORS;  Service: Urology;  Laterality: Left;  . INGUINAL HERNIA REPAIR Left 09/16/2018   Procedure: HERNIA REPAIR INGUINAL ADULT;  Surgeon: Herbert Pun, MD;  Location: [facility] ORS;  Service: General;  Laterality: Left;    There were no vitals filed for this visit.   Subjective Assessment - 08/05/19 0851    Subjective Patient reports having a hard time moving left ankle with HEP stating, "I just cant' do the alphabet. That's just not going to happen." denies any pain; Denies any new falls;    Pertinent History Patient is a pleasant 74 year old male who presents for CVA. Patient had a R CVA on memorial day weekend May 30th (Sunday before memorial day). Woke up and left leg went out from underneath from him. Left leg and arm affected. Went to the next morning, waited for  6 hours in emergency room. Has had some stumbles but no falls.  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 Has been keeping as active as possible and is improving.    Limitations Lifting;Standing;Walking;House hold activities    How long can you sit comfortably? n/a    How long can you stand comfortably? not limited    How long can you walk comfortably? 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    Patient Stated Goals get better foot control.    Currently in Pain? No/denies    Multiple Pain Sites No              TREATMENT: Warm up on Crosstrainer, level 2 x4 min (Unbilled);  Instructed patient in LE ankle strengthening/balance activities to facilitate improved ankle control; NMR: Standing in parallel bars: Standing on 1/2 bolster: -heel/toe raises x15 reps with B rail assist and cues for erect posture/knee extension to facilitate ankle ROM; -feet in neutral, BUE wand flexion x10 reps with min A for safety; -tandem stance 10 sec hold x2 reps each foot in front Patient required min  A to CGA for safety with standing on compliant surface. He also required cues for neutral weight shift for better stance control;   EX: Seated: LLE ankle DF, IV, EV red tband x12 reps each; required min VCs to isolate ankle ROM; patient did have increased difficulty with ankle EV  PT applied walkaide for ankle DF/EV contracture; patient able to exhibit good twitch response; Instructed patient in exercise mode, on 3 sec, off 5 sec x10 min;   Following walkaide strengthening, patient ambulated around gym with better  ankle DF at heel strike; Instructed patient in long sitting LLE ankle EV red tband x12 reps with slight improvement in AROM noted;  Response to treatment: patient tolerated fair. He denies any pain at end of session. He was able to exhibit better ankle DF/EV with walkaide activation. Skin inspected when walkaide removed with good skin integrity noted. Patient would benefit from additional skilled PT Intervention to improve strength, ROM and ankle strategies for better safety with mobility;  Reinforced HEP;                      PT Education - 08/05/19 0845    Education Details LE strength, balance, HEP    Person(s) Educated Patient    Methods Explanation;Verbal cues    Comprehension Verbalized understanding;Returned demonstration;Verbal cues required;Need further instruction            PT Short Term Goals - 07/30/19 1750      PT SHORT TERM GOAL #1   Title Patient will be independent in home exercise program to improve strength/mobility for better functional independence with ADLs.    Baseline 07/30/19: HEP given    Time 4    Period Weeks    Status New    Target Date 08/27/19      PT SHORT TERM GOAL #2   Title Patient (> 33 years old) will complete five times sit to stand test in < 15 seconds indicating an increased LE strength and improved balance.    Baseline 6/23: 16.47 sec with SUE support    Time 4    Period Weeks    Status New    Target Date 08/27/19             PT Long Term Goals - 07/30/19 1751      PT LONG TERM GOAL #1   Title Patient will increase FOTO score to equal to or greater than  69%   to demonstrate statistically significant improvement in mobility and quality of life.    Baseline 6/23: 56%    Time 8    Period Weeks    Status New    Target Date 09/24/19      PT LONG TERM GOAL #2   Title Patient will increase 10 meter walk test to >1.8m/s as to improve gait speed for better community ambulation and to reduce fall risk.    Baseline  0.71 m/s    Time 8    Period Weeks    Status New    Target Date 09/24/19      PT LONG TERM GOAL #3   Title Patient will increase six minute walk test distance to >1000 for progression to community ambulator and improve gait ability    Baseline 6/23: 855 ft    Time 8    Period Weeks    Status New    Target Date 09/24/19      PT LONG TERM GOAL #4   Title Patient will  increase BLE gross strength to 4+/5 as to improve functional strength for independent gait, increased standing tolerance and increased ADL ability.    Baseline 6/23: see document    Time 8    Period Weeks    Status New    Target Date 09/24/19                 Plan - 08/05/19 0935    Clinical Impression Statement Patient instructed in advanced LE ankle ROM/strengthening to faciltiate better control. He does exhibit increased difficulty with ankle EV active range of motion. PT fit patient for walkaide with good twitch response. He was able to exhibit good ankle DF/EV during walkaide activation. PT instructed patient in exercise mode for better ankle strengthening. Following walkaide pt able to exhibit better DF at heel strike during gait. he was also able to exhibit improved partial ROM on LLE ankle EV with resisted strengthening. Reinforced HEP. patient would benefit from additional skilled PT intervention to improve strength, balance and mobility;    Personal Factors and Comorbidities Age;Finances;Comorbidity 2;Profession;Time since onset of injury/illness/exacerbation    Comorbidities arthritis, HTN    Examination-Activity Limitations Caring for Others;Bend;Carry;Dressing;Hygiene/Grooming;Lift;Locomotion Level;Reach Overhead;Squat;Stairs;Stand;Transfers    Examination-Participation Restrictions Church;Cleaning;Community Activity;Laundry;Volunteer;Shop;Meal Prep;Yard Work    [employer]chant [employer] officer Evolving/Moderate complexity    Rehab Potential Fair    PT Frequency 2x / week    PT Duration 8 weeks    PT  Treatment/Interventions ADLs/Self Care Home Management;Aquatic Therapy;Biofeedback;Cryotherapy;Electrical Stimulation;Iontophoresis 4mg /ml Dexamethasone;Canalith Repostioning;Moist Heat;Traction;Ultrasound;DME Instruction;Gait training;Stair training;Functional mobility training;Therapeutic activities;Patient/family education;Cognitive remediation;Neuromuscular re-education;Balance training;Therapeutic exercise;Orthotic Fit/Training;Manual techniques;Splinting;Energy conservation;Dry needling;Passive range of motion;Taping;Vasopneumatic Device;Vestibular    PT Next Visit Plan LLE strengthening, coordination, ankle strategies    PT Home Exercise Plan see above    Consulted and Agree with Plan of Care Patient           Patient will benefit from skilled therapeutic intervention in order to improve the following deficits and impairments:  Abnormal gait, Cardiopulmonary status limiting activity, Decreased activity tolerance, Decreased balance, Decreased endurance, Decreased coordination, Decreased knowledge of use of DME, Decreased mobility, Difficulty walking, Decreased strength, Impaired flexibility, Impaired perceived functional ability, Impaired sensation, Impaired UE functional use, Postural dysfunction, Improper body mechanics  Visit Diagnosis: Unsteadiness on feet  Muscle weakness (generalized)  Other abnormalities of gait and mobility     Problem List Patient Active Problem List   Diagnosis Date Noted  . Borderline diabetes mellitus 09/11/2018  . Hyperlipemia, mixed 09/11/2018  . Tobacco dependence 09/11/2018  . Abdominal aortic aneurysm (AAA) without rupture ([place]) 06/11/2018  . Chronic systolic CHF (congestive heart failure), NYHA class 2 ([place]) 10/30/2016  . Paroxysmal A-fib ([place]) 03/10/2015  . Benign essential hypertension 08/27/2014    , PT, DPT 08/05/2019, 9:39 AM  [place] MAIN [facility] SERVICES [address]  [place], [place], [address] Phone: [phone]   Fax:  [phone]  Name: Samuel Boyd MRN: [id number] Date of Birth: 18-Apr-1945

## 2019-08-08 ENCOUNTER — Other Ambulatory Visit: Payer: Self-pay

## 2019-08-08 ENCOUNTER — Ambulatory Visit: Payer: Medicare Other | Attending: Neurology

## 2019-08-08 DIAGNOSIS — R278 Other lack of coordination: Secondary | ICD-10-CM | POA: Insufficient documentation

## 2019-08-08 DIAGNOSIS — M6281 Muscle weakness (generalized): Secondary | ICD-10-CM

## 2019-08-08 DIAGNOSIS — R2689 Other abnormalities of gait and mobility: Secondary | ICD-10-CM | POA: Diagnosis present

## 2019-08-08 DIAGNOSIS — R2681 Unsteadiness on feet: Secondary | ICD-10-CM

## 2019-08-08 NOTE — Therapy (Signed)
[place] MAIN [facility] SERVICES [address] [place], [place], [address] Phone: [phone]   Fax:  [phone]  Physical Therapy Treatment  Patient Details  Name: Samuel Boyd MRN: [id number] Date of Birth: 1945/11/23 Referring Provider (PT): Jennings Books MD   Encounter Date: 08/08/2019   PT End of Session - 08/08/19 1008    Visit Number 3    Number of Visits 16    Date for PT Re-Evaluation 09/24/19    Authorization Type 3/10 eval 07/30/19    Authorization Time Period FOTO PT    PT Start Time 1002    PT Stop Time 1045    PT Time Calculation (min) 43 min    Equipment Utilized During Treatment Gait belt    Activity Tolerance Patient tolerated treatment well    Behavior During Therapy [facility] for tasks assessed/performed           Past Medical History:  Diagnosis Date  . Arthritis   . Dysrhythmia   . History of kidney stones   . Hypertension   . Pneumonia     Past Surgical History:  Procedure Laterality Date  . CYSTOSCOPY/URETEROSCOPY/HOLMIUM LASER/STENT PLACEMENT Left 09/16/2018   Procedure: CYSTOSCOPY/URETEROSCOPY/HOLMIUM LASER/STENT PLACEMENT, LEFT;  Surgeon: Hollice Espy, MD;  Location: [facility] ORS;  Service: Urology;  Laterality: Left;  . INGUINAL HERNIA REPAIR Left 09/16/2018   Procedure: HERNIA REPAIR INGUINAL ADULT;  Surgeon: Herbert Pun, MD;  Location: [facility] ORS;  Service: General;  Laterality: Left;    There were no vitals filed for this visit.   Subjective Assessment - 08/08/19 1006    Subjective Patient reports his ankle continues to roll out if he doesn't wear his boots. No falls or LOB since last session, no pain    Pertinent History Patient is a pleasant 74 year old male who presents for CVA. Patient had a R CVA on memorial day weekend May 30th (Sunday before memorial day). Woke up and left leg went out from underneath from him. Left leg and arm affected. Went to the next morning, waited for 6 hours in  emergency room. Has had some stumbles but no falls.  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 Has been keeping as active as possible and is improving.    Limitations Lifting;Standing;Walking;House hold activities    How long can you sit comfortably? n/a    How long can you stand comfortably? not limited    How long can you walk comfortably? 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    Patient Stated Goals get better foot control.    Currently in Pain? No/denies                 TREATMENT: Warm up on Crosstrainer, level 3 RPM; cues for neutralizing L foot/ankle for reduction of eversion     Instructed patient in LE ankle strengthening/balance activities to facilitate improved ankle control;  NMR: Standing in parallel bars: airex pad with dynadisc in front modified tandem stance 2x 30 second holds; frequent need for UE support to stabilize self  bosu ball round side up: modified lunge 10x each LE; no UE support   bosu ball round side up: modified  lateral lunges 15x each LE; no UE support   Golfer single leg kick back with forward reach to pick up cone on 4" step with airex pad on top : 4 cones x2 sets each LE, SUE support  Cone taps 4 in a row: focus on controlled df/pf and spatial awareness, x 3 minutes  Forward tap to hedgehog, backwards lunges tap to hedgehog, 10x each LE; SUE support  Single limb stance holding onto railing with SUE support 30 second holds LLE.   Patient required min A to CGA for safety with standing on compliant surface. He also required cues for neutral weight shift  for better stance control;     EX: Seated: Left ankle inversion/eversion to tap line midline 15x Dynadisc: df/pf 15x; circle clockwise 10x, counterclockwise 10x  4" step under RLE: STS 10x   4" step alternating toe taps to sticky notes 30 seconds for coordination, spatial awareness, and muscle activation sequencing technique x 2 trials, improved with repetition for LLE.                     PT Education - 08/08/19 1008    Education Details exercise technique body mechanics, ankle stability    Person(s) Educated Patient    Methods Explanation;Demonstration;Tactile cues;Verbal cues    Comprehension Verbalized understanding;Returned demonstration;Verbal cues required;Tactile cues required            PT Short Term Goals - 07/30/19 1750      PT SHORT TERM GOAL #1   Title Patient will be independent in home exercise program to improve strength/mobility for better functional independence with ADLs.    Baseline 07/30/19: HEP given    Time 4    Period Weeks    Status New    Target Date 08/27/19      PT SHORT TERM GOAL #2   Title Patient (> 72 years old) will complete five times sit to stand test in < 15 seconds indicating an increased LE strength and improved balance.    Baseline 6/23: 16.47 sec with SUE support    Time 4    Period Weeks    Status New    Target Date 08/27/19             PT Long Term Goals - 07/30/19 1751      PT LONG TERM GOAL #1   Title Patient will increase FOTO score to equal to or greater than  69%   to demonstrate statistically significant improvement in mobility and quality of life.    Baseline 6/23: 56%    Time 8    Period Weeks    Status New    Target Date 09/24/19      PT LONG TERM GOAL #2   Title Patient will increase 10 meter walk test to >1.28m/s as to improve gait speed for better community ambulation and to reduce fall risk.    Baseline 0.71 m/s    Time 8    Period Weeks    Status New    Target Date 09/24/19      PT  LONG TERM GOAL #3   Title Patient will increase six minute walk test distance to >1000 for progression to community ambulator and improve gait ability    Baseline 6/23: 855 ft    Time 8    Period Weeks    Status New    Target Date 09/24/19      PT LONG TERM GOAL #4   Title Patient will  increase BLE gross strength to 4+/5 as to improve functional strength for independent gait, increased standing tolerance and increased ADL ability.    Baseline 6/23: see document    Time 8    Period Weeks    Status New    Target Date 09/24/19                 Plan - 08/08/19 1104    Clinical Impression Statement Patient presents with good motivation throughout physical therapy session. He is limited in ankle stability and spatial awareness of LLE with poor control of placement and sequencing. Repetition of techniques does demonstrate short term carryover indicating capacity for improvement. Patient would benefit from skilled physical therapy to increase strength, stability, and capacity for functional mobility. Patient would benefit from skilled physical therapy to increase strength, stability, and capacity for functional mobility.    Personal Factors and Comorbidities Age;Finances;Comorbidity 2;Profession;Time since onset of injury/illness/exacerbation    Comorbidities arthritis, HTN    Examination-Activity Limitations Caring for Others;Bend;Carry;Dressing;Hygiene/Grooming;Lift;Locomotion Level;Reach Overhead;Squat;Stairs;Stand;Transfers    Examination-Participation Restrictions Church;Cleaning;Community Activity;Laundry;Volunteer;Shop;Meal Prep;Yard Work    [employer] officer Evolving/Moderate complexity    Rehab Potential Fair    PT Frequency 2x / week    PT Duration 8 weeks    PT Treatment/Interventions ADLs/Self Care Home Management;Aquatic Therapy;Biofeedback;Cryotherapy;Electrical Stimulation;Iontophoresis 4mg /ml Dexamethasone;Canalith Repostioning;Moist  Heat;Traction;Ultrasound;DME Instruction;Gait training;Stair training;Functional mobility training;Therapeutic activities;Patient/family education;Cognitive remediation;Neuromuscular re-education;Balance training;Therapeutic exercise;Orthotic Fit/Training;Manual techniques;Splinting;Energy conservation;Dry needling;Passive range of motion;Taping;Vasopneumatic Device;Vestibular    PT Next Visit Plan LLE strengthening, coordination, ankle strategies    PT Home Exercise Plan see above    Consulted and Agree with Plan of Care Patient           Patient will benefit from skilled therapeutic intervention in order to improve the following deficits and impairments:  Abnormal gait, Cardiopulmonary status limiting activity, Decreased activity tolerance, Decreased balance, Decreased endurance, Decreased coordination, Decreased knowledge of use of DME, Decreased mobility, Difficulty walking, Decreased strength, Impaired flexibility, Impaired perceived functional ability, Impaired sensation, Impaired UE functional use, Postural dysfunction, Improper body mechanics  Visit Diagnosis: Unsteadiness on feet  Muscle weakness (generalized)  Other abnormalities of gait and mobility     Problem List Patient Active Problem List   Diagnosis Date Noted  . Borderline diabetes mellitus 09/11/2018  . Hyperlipemia, mixed 09/11/2018  . Tobacco dependence 09/11/2018  . Abdominal aortic aneurysm (AAA) without rupture ([place]) 06/11/2018  . Chronic systolic CHF (congestive heart failure), NYHA class 2 ([place]) 10/30/2016  . Paroxysmal A-fib ([place]) 03/10/2015  . Benign essential hypertension 08/27/2014   Janna Arch, PT, DPT   08/08/2019, 11:04 AM  [place] MAIN [facility] SERVICES [address] [place], [place], [address] Phone: [phone]   Fax:  [phone]  Name: Samuel Boyd MRN: [id number] Date of Birth: 05/21/1945

## 2019-08-12 ENCOUNTER — Ambulatory Visit: Payer: Medicare Other | Admitting: Physical Therapy

## 2019-08-12 ENCOUNTER — Encounter: Payer: Self-pay | Admitting: Physical Therapy

## 2019-08-12 ENCOUNTER — Other Ambulatory Visit: Payer: Self-pay

## 2019-08-12 DIAGNOSIS — R2689 Other abnormalities of gait and mobility: Secondary | ICD-10-CM

## 2019-08-12 DIAGNOSIS — R2681 Unsteadiness on feet: Secondary | ICD-10-CM

## 2019-08-12 DIAGNOSIS — M6281 Muscle weakness (generalized): Secondary | ICD-10-CM

## 2019-08-12 NOTE — Therapy (Signed)
[place] MAIN [facility] SERVICES [address] [place], [place], [address] Phone: [phone]   Fax:  [phone]  Physical Therapy Treatment  Patient Details  Name: Samuel Boyd MRN: [id number] Date of Birth: 25-Mar-1945 Referring Provider (PT): Jennings Books MD   Encounter Date: 08/12/2019   PT End of Session - 08/12/19 1107    Visit Number 4    Number of Visits 16    Date for PT Re-Evaluation 09/24/19    Authorization Type 4/10 eval 07/30/19    Authorization Time Period FOTO PT    PT Start Time 1102    PT Stop Time 1145    PT Time Calculation (min) 43 min    Equipment Utilized During Treatment Gait belt    Activity Tolerance Patient tolerated treatment well    Behavior During Therapy [facility] for tasks assessed/performed           Past Medical History:  Diagnosis Date  . Arthritis   . Dysrhythmia   . History of kidney stones   . Hypertension   . Pneumonia     Past Surgical History:  Procedure Laterality Date  . CYSTOSCOPY/URETEROSCOPY/HOLMIUM LASER/STENT PLACEMENT Left 09/16/2018   Procedure: CYSTOSCOPY/URETEROSCOPY/HOLMIUM LASER/STENT PLACEMENT, LEFT;  Surgeon: Hollice Espy, MD;  Location: [facility] ORS;  Service: Urology;  Laterality: Left;  . INGUINAL HERNIA REPAIR Left 09/16/2018   Procedure: HERNIA REPAIR INGUINAL ADULT;  Surgeon: Herbert Pun, MD;  Location: [facility] ORS;  Service: General;  Laterality: Left;    There were no vitals filed for this visit.   Subjective Assessment - 08/12/19 1106    Subjective Patient reports his ankle continues to be weak and he isn't able to move his foot like he wants to. No pain;    Pertinent History Patient is a pleasant 74 year old male who presents for CVA. Patient had a R CVA on memorial day weekend May 30th (Sunday before memorial day). Woke up and left leg went out from underneath from him. Left leg and arm affected. Went to the next morning, waited for 6 hours in emergency room.  Has had some stumbles but no falls.  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 Has been keeping as active as possible and is improving.    Limitations Lifting;Standing;Walking;House hold activities    How long can you sit comfortably? n/a    How long can you stand comfortably? not limited    How long can you walk comfortably? 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    Patient Stated Goals get better foot control.    Currently in Pain? No/denies    Multiple Pain Sites No                TREATMENT: Warm up on Crosstrainer, level 2 x4 min (Unbilled);  Instructed patient in LE ankle strengthening/balance activities to facilitate improved ankle control; NMR: Standing in parallel bars: airex pad with dynadisc in front modified tandem stance 2x 30 second holds; frequent need for UE support to stabilize self  bosu ball round side up: modified lunge 10x each LE; with 2-0 UE support   Standing on BOSU: -heel/toe raises x15 reps; -single limb stance with  hip flexion march x10 reps with LLE, x15 reps with RLE to facilitate increased challenge of SLS on LLE; Patient initially required 2 HHA progressed to no HHA with min A for safety;  LLE on yellow dyna disc: RLE hip abduction x10 reps;, RLE hip extension x10 reps; Required 2 HHA for balance with cues to increase weight bearing in LLE for better ankle challenge;   PT applied walkaide to LLE ankle for DF, programmed for gait tasks to facilitate improved ankle DF at heel strike; Patient ambulated 200 feet with walkaide exhibiting better ankle DF but  continues to have slight ankle EV;   BAPs board L3 LLE only, DF/PF x10 reps, IV/EV x10 reps; Patient required mod VCs for proper positioning to isolate ankle ROM. He had significant difficulty with ankle EV/IV;  EX: Seated: LLE ankle DF/EV red tband 2 x10 reps each; required min VCs to isolate ankle ROM; patient did have increased difficulty with ankle EV  PT applied walkaide for ankle DF/EV contracture; patient able to exhibit good twitch response; Intensity #3;  Instructed patient in exercise mode, on 3 sec, off 5 sec x10 min;   Following walkaide strengthening, patient ambulated around gym with better ankle DF at heel strike; Instructed patient in long sitting LLE ankle EV red tband x12 reps with slight improvement in AROM noted;  Response to treatment: patient tolerated fair. He denies any pain at end of session. He was able to exhibit better ankle DF/EV with walkaide activation. Skin inspected when walkaide removed with good skin integrity noted. Patient would benefit from additional skilled PT Intervention to improve strength, ROM and ankle strategies for better safety with mobility;  Reinforced HEP;                       PT Education - 08/12/19 1106    Education Details exercise technique, strengthening,    Person(s) Educated Patient    Methods Explanation;Verbal cues    Comprehension Verbalized understanding;Returned demonstration;Verbal cues required;Need further instruction            PT Short Term Goals - 07/30/19 1750      PT SHORT TERM GOAL #1   Title Patient will be independent in home exercise program to improve strength/mobility for better functional independence with ADLs.    Baseline 07/30/19: HEP given    Time 4    Period Weeks    Status New    Target Date 08/27/19      PT SHORT TERM GOAL #2   Title Patient (> 62 years old) will complete five times sit to stand test in < 15 seconds indicating an increased LE strength and improved balance.     Baseline 6/23: 16.47 sec with SUE support    Time 4    Period Weeks    Status New    Target Date 08/27/19             PT Long Term Goals - 07/30/19 1751      PT LONG TERM GOAL #1   Title Patient will increase FOTO score to equal to or greater than  69%   to demonstrate statistically significant improvement in mobility and quality of life.    Baseline 6/23: 56%    Time 8    Period Weeks    Status New    Target Date 09/24/19      PT LONG TERM GOAL #2   Title Patient will increase 10 meter walk test to >1.59m/s as  to improve gait speed for better community ambulation and to reduce fall risk.    Baseline 0.71 m/s    Time 8    Period Weeks    Status New    Target Date 09/24/19      PT LONG TERM GOAL #3   Title Patient will increase six minute walk test distance to >1000 for progression to community ambulator and improve gait ability    Baseline 6/23: 855 ft    Time 8    Period Weeks    Status New    Target Date 09/24/19      PT LONG TERM GOAL #4   Title Patient will increase BLE gross strength to 4+/5 as to improve functional strength for independent gait, increased standing tolerance and increased ADL ability.    Baseline 6/23: see document    Time 8    Period Weeks    Status New    Target Date 09/24/19                 Plan - 08/12/19 1122    Clinical Impression Statement Patient motivated and participated well within session. Instructed patient in advanced ankle stabilization exercise utilizing compliant surface to challenge stance control. Patient does require min A for balance especially with less rail assist. He also required cues for increased weight shift to LLE. Patient continues to have weakness in LLE. instructed patient in LE strengthening exercise. he requires min Vcs for proper positioning/exercise technique. He was able to exhibit better twitch response with walkaide this session. Patient would benefit from additional skilled PT intervention to  improve strength, balance and mobility;    Personal Factors and Comorbidities Age;Finances;Comorbidity 2;Profession;Time since onset of injury/illness/exacerbation    Comorbidities arthritis, HTN    Examination-Activity Limitations Caring for Others;Bend;Carry;Dressing;Hygiene/Grooming;Lift;Locomotion Level;Reach Overhead;Squat;Stairs;Stand;Transfers    Examination-Participation Restrictions Church;Cleaning;Community Activity;Laundry;Volunteer;Shop;Meal Prep;Yard Work    [employer]chant [employer] officer Evolving/Moderate complexity    Rehab Potential Fair    PT Frequency 2x / week    PT Duration 8 weeks    PT Treatment/Interventions ADLs/Self Care Home Management;Aquatic Therapy;Biofeedback;Cryotherapy;Electrical Stimulation;Iontophoresis 4mg /ml Dexamethasone;Canalith Repostioning;Moist Heat;Traction;Ultrasound;DME Instruction;Gait training;Stair training;Functional mobility training;Therapeutic activities;Patient/family education;Cognitive remediation;Neuromuscular re-education;Balance training;Therapeutic exercise;Orthotic Fit/Training;Manual techniques;Splinting;Energy conservation;Dry needling;Passive range of motion;Taping;Vasopneumatic Device;Vestibular    PT Next Visit Plan LLE strengthening, coordination, ankle strategies    PT Home Exercise Plan see above    Consulted and Agree with Plan of Care Patient           Patient will benefit from skilled therapeutic intervention in order to improve the following deficits and impairments:  Abnormal gait, Cardiopulmonary status limiting activity, Decreased activity tolerance, Decreased balance, Decreased endurance, Decreased coordination, Decreased knowledge of use of DME, Decreased mobility, Difficulty walking, Decreased strength, Impaired flexibility, Impaired perceived functional ability, Impaired sensation, Impaired UE functional use, Postural dysfunction, Improper body mechanics  Visit Diagnosis: Unsteadiness on feet  Muscle weakness  (generalized)  Other abnormalities of gait and mobility     Problem List Patient Active Problem List   Diagnosis Date Noted  . Borderline diabetes mellitus 09/11/2018  . Hyperlipemia, mixed 09/11/2018  . Tobacco dependence 09/11/2018  . Abdominal aortic aneurysm (AAA) without rupture ([place]) 06/11/2018  . Chronic systolic CHF (congestive heart failure), NYHA class 2 ([place]) 10/30/2016  . Paroxysmal A-fib ([place]) 03/10/2015  . Benign essential hypertension 08/27/2014    , PT, DPT 08/12/2019, 11:45 AM  [place] MAIN [facility] SERVICES [address] [place], [place], [address] Phone: [phone]   Fax:  [phone]  Name: Samuel Boyd MRN: [id number] Date of Birth: 1945-06-07

## 2019-08-14 ENCOUNTER — Other Ambulatory Visit: Payer: Self-pay

## 2019-08-14 ENCOUNTER — Ambulatory Visit: Payer: Medicare Other | Admitting: Physical Therapy

## 2019-08-14 ENCOUNTER — Encounter: Payer: Self-pay | Admitting: Physical Therapy

## 2019-08-14 DIAGNOSIS — R2689 Other abnormalities of gait and mobility: Secondary | ICD-10-CM

## 2019-08-14 DIAGNOSIS — R2681 Unsteadiness on feet: Secondary | ICD-10-CM

## 2019-08-14 DIAGNOSIS — M6281 Muscle weakness (generalized): Secondary | ICD-10-CM

## 2019-08-14 NOTE — Patient Instructions (Signed)
Provided written handout from [facility] for following exercise: Seated ankle DF resisted red tband 2x15 reps Standing single leg heel raise 2x10 with counter support for balance;

## 2019-08-14 NOTE — Therapy (Signed)
[place] MAIN [facility] SERVICES [address] [place], [place], [address] Phone: [phone]   Fax:  [phone]  Physical Therapy Treatment  Patient Details  Name: Samuel Boyd MRN: [id number] Date of Birth: 1946-01-14 Referring Provider (PT): Jennings Books MD   Encounter Date: 08/14/2019   PT End of Session - 08/14/19 1108    Visit Number 5    Number of Visits 16    Date for PT Re-Evaluation 09/24/19    Authorization Type 5/10 eval 07/30/19    Authorization Time Period FOTO PT    PT Start Time 1102    PT Stop Time 1145    PT Time Calculation (min) 43 min    Equipment Utilized During Treatment Gait belt    Activity Tolerance Patient tolerated treatment well    Behavior During Therapy [facility] for tasks assessed/performed           Past Medical History:  Diagnosis Date  . Arthritis   . Dysrhythmia   . History of kidney stones   . Hypertension   . Pneumonia     Past Surgical History:  Procedure Laterality Date  . CYSTOSCOPY/URETEROSCOPY/HOLMIUM LASER/STENT PLACEMENT Left 09/16/2018   Procedure: CYSTOSCOPY/URETEROSCOPY/HOLMIUM LASER/STENT PLACEMENT, LEFT;  Surgeon: Hollice Espy, MD;  Location: [facility] ORS;  Service: Urology;  Laterality: Left;  . INGUINAL HERNIA REPAIR Left 09/16/2018   Procedure: HERNIA REPAIR INGUINAL ADULT;  Surgeon: Herbert Pun, MD;  Location: [facility] ORS;  Service: General;  Laterality: Left;    There were no vitals filed for this visit.   Subjective Assessment - 08/14/19 1107    Subjective Patient reports increased soreness in both legs today after doing a lot of walking yesterday. He wore his boots and did a lot of walking in the chicken houses and was sore.    Pertinent History Patient is a pleasant 74 year old male who presents for CVA. Patient had a R CVA on memorial day weekend May 30th (Sunday before memorial day). Woke up and left leg went out from underneath from him. Left leg and arm affected.  Went to the next morning, waited for 6 hours in emergency room. Has had some stumbles but no falls.  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 Has been keeping as active as possible and is improving.    Limitations Lifting;Standing;Walking;House hold activities    How long can you sit comfortably? n/a    How long can you stand comfortably? not limited    How long can you walk comfortably? 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    Patient Stated Goals get better foot control.    Currently in Pain? No/denies    Multiple Pain Sites No                TREATMENT: Warm up on Crosstrainer, level 2 x4 min (Unbilled);  Instructed patient in LE ankle strengthening/balance activities to facilitate improved ankle control; NMR: Standing in parallel bars: Standing on airex:  - unsupported heel/toe raises  3 sec hold x10 reps; -standing on one leg with contralateral leg on ball:  Unsupported standing 15 sec hold x2 reps;  Progressed to LE ball circles x10 reps  each LE x2 sets;  Standing on 1/2 bolster (flat side up) -heel/toe raises x15 reps; -BUE wand flexion x15 reps to challenge ankle stabilization with cues for weight shift to neutral for increased LLE weight bearing;  -tandem stance 10 sec hold x3 reps each foot in front, unsupported with min A for safety; Patient required min A for safety especially with reduced rail assist;  Patient required min VCs for balance stability, including to increase trunk control for less loss of balance with smaller base of support   EX: Seated: LLE ankle DF/EV  red tband 2 x15 reps each; required min VCs to isolate ankle ROM; patient did have increased difficulty with ankle EV  Long sitting: RLE ankle IV red tband 2x10, improved AROM noted, does require min A to avoid hip rotation;  Instructed patient in long sitting LLE ankle EV red tband 2x10 reps with  improvement in AROM noted;  Leg press: BLE plate 100# N82 LLE only plate: 55# x10 Patient required min-moderate verbal/tactile cues for correct exercise technique.  Standing with rail assist, single leg heel raises x10 each side;   Response to treatment: patient tolerated well. He is progressing in improved ankle AROM with resisted exercise. Progressed HEP, see patient instructions. Patient does require min VCs for proper positioning/exercise technique;  Pt instructed in advanced balance activities utilizing compliant surface to challenge ankle stabilization. He does require min A when standing without rail assist. Patient was able to tolerate increased weight bearing in LLE with less difficulty this session; He would benefit from additional skilled PT intervention to improve strength, balance and mobility;                      PT Education - 08/14/19 1108    Education Details exercise technique, strengthening;    Person(s) Educated Patient    Methods Explanation    Comprehension Verbalized understanding;Returned demonstration;Verbal cues required;Need further instruction            PT Short Term Goals - 07/30/19 1750      PT SHORT TERM GOAL #1   Title Patient will be independent in home exercise program to improve strength/mobility for better functional independence with ADLs.    Baseline 07/30/19: HEP given    Time 4    Period Weeks    Status New    Target Date 08/27/19      PT SHORT TERM GOAL #2   Title Patient (> 49 years old) will complete five times sit to stand test in < 15 seconds indicating Samuel increased LE strength and improved balance.    Baseline 6/23:  16.47 sec with SUE support    Time 4    Period Weeks    Status New    Target Date 08/27/19             PT Long Term Goals - 07/30/19 1751      PT LONG TERM GOAL #1   Title Patient will increase FOTO score to equal to or greater than  69%   to demonstrate statistically significant improvement in mobility and quality of life.    Baseline 6/23: 56%    Time 8    Period Weeks    Status New    Target Date 09/24/19      PT LONG TERM GOAL #2   Title Patient will increase 10 meter walk test to >1.73m/s as to improve gait speed for better community ambulation and to reduce fall risk.  Baseline 0.71 m/s    Time 8    Period Weeks    Status New    Target Date 09/24/19      PT LONG TERM GOAL #3   Title Patient will increase six minute walk test distance to >1000 for progression to community ambulator and improve gait ability    Baseline 6/23: 855 ft    Time 8    Period Weeks    Status New    Target Date 09/24/19      PT LONG TERM GOAL #4   Title Patient will increase BLE gross strength to 4+/5 as to improve functional strength for independent gait, increased standing tolerance and increased ADL ability.    Baseline 6/23: see document    Time 8    Period Weeks    Status New    Target Date 09/24/19                 Plan - 08/14/19 1316    Clinical Impression Statement Patient motivated and participated well within session.  He is progressing in improved ankle AROM with resisted exercise. Progressed HEP, see patient instructions. Patient does require min VCs for proper positioning/exercise technique; Pt instructed in advanced balance activities utilizing compliant surface to challenge ankle stabilization. He does require min A when standing without rail assist. Patient was able to tolerate increased weight bearing in LLE with less difficulty this session; He would benefit from additional skilled PT intervention to improve strength, balance and mobility;    Personal Factors and  Comorbidities Age;Finances;Comorbidity 2;Profession;Time since onset of injury/illness/exacerbation    Comorbidities arthritis, HTN    Examination-Activity Limitations Caring for Others;Bend;Carry;Dressing;Hygiene/Grooming;Lift;Locomotion Level;Reach Overhead;Squat;Stairs;Stand;Transfers    Examination-Participation Restrictions Church;Cleaning;Community Activity;Laundry;Volunteer;Shop;Meal Prep;Yard Work    [employer]chant [employer] officer Evolving/Moderate complexity    Rehab Potential Fair    PT Frequency 2x / week    PT Duration 8 weeks    PT Treatment/Interventions ADLs/Self Care Home Management;Aquatic Therapy;Biofeedback;Cryotherapy;Electrical Stimulation;Iontophoresis 4mg /ml Dexamethasone;Canalith Repostioning;Moist Heat;Traction;Ultrasound;DME Instruction;Gait training;Stair training;Functional mobility training;Therapeutic activities;Patient/family education;Cognitive remediation;Neuromuscular re-education;Balance training;Therapeutic exercise;Orthotic Fit/Training;Manual techniques;Splinting;Energy conservation;Dry needling;Passive range of motion;Taping;Vasopneumatic Device;Vestibular    PT Next Visit Plan LLE strengthening, coordination, ankle strategies    PT Home Exercise Plan see above    Consulted and Agree with Plan of Care Patient           Patient will benefit from skilled therapeutic intervention in order to improve the following deficits and impairments:  Abnormal gait, Cardiopulmonary status limiting activity, Decreased activity tolerance, Decreased balance, Decreased endurance, Decreased coordination, Decreased knowledge of use of DME, Decreased mobility, Difficulty walking, Decreased strength, Impaired flexibility, Impaired perceived functional ability, Impaired sensation, Impaired UE functional use, Postural dysfunction, Improper body mechanics  Visit Diagnosis: Unsteadiness on feet  Muscle weakness (generalized)  Other abnormalities of gait and  mobility     Problem List Patient Active Problem List   Diagnosis Date Noted  . Borderline diabetes mellitus 09/11/2018  . Hyperlipemia, mixed 09/11/2018  . Tobacco dependence 09/11/2018  . Abdominal aortic aneurysm (AAA) without rupture ([place]) 06/11/2018  . Chronic systolic CHF (congestive heart failure), NYHA class 2 ([place]) 10/30/2016  . Paroxysmal A-fib ([place]) 03/10/2015  . Benign essential hypertension 08/27/2014    , PT, DPT 08/14/2019, 1:18 PM  [place] MAIN [facility] SERVICES [address] [place], [place], [address] Phone: [phone]   Fax:  [phone]  Name: Samuel Boyd MRN: [id number] Date of Birth: April 14, 1945

## 2019-08-19 ENCOUNTER — Other Ambulatory Visit: Payer: Self-pay

## 2019-08-19 ENCOUNTER — Ambulatory Visit: Payer: Medicare Other | Admitting: Physical Therapy

## 2019-08-19 ENCOUNTER — Encounter: Payer: Self-pay | Admitting: Physical Therapy

## 2019-08-19 DIAGNOSIS — R2681 Unsteadiness on feet: Secondary | ICD-10-CM

## 2019-08-19 DIAGNOSIS — R2689 Other abnormalities of gait and mobility: Secondary | ICD-10-CM

## 2019-08-19 DIAGNOSIS — M6281 Muscle weakness (generalized): Secondary | ICD-10-CM

## 2019-08-19 NOTE — Therapy (Signed)
[place] [facility] [address] [place], [place], [address] Phone: [phone]   Fax:  [phone]  Physical Therapy Treatment  Patient Details  Name: Samuel Boyd MRN: [id number] Date of Birth: [birth year] Referring Provider (PT): Jennings Books MD   Encounter Date: 08/19/2019   PT End of Session - 08/19/19 0850    Visit Number 6    Number of Visits 16    Date for PT Re-Evaluation 09/24/19    Authorization Type 5/10 eval 07/30/19    Authorization Time Period FOTO PT    PT Start Time 0846    PT Stop Time 0930    PT Time Calculation (min) 44 min    Equipment Utilized During Treatment Gait belt    Activity Tolerance Patient tolerated treatment well    Behavior During Therapy [facility] for tasks assessed/performed           Past Medical History:  Diagnosis Date  . Arthritis   . Dysrhythmia   . History of kidney stones   . Hypertension   . Pneumonia     Past Surgical History:  Procedure Laterality Date  . CYSTOSCOPY/URETEROSCOPY/HOLMIUM LASER/STENT PLACEMENT Left 09/16/2018   Procedure: CYSTOSCOPY/URETEROSCOPY/HOLMIUM LASER/STENT PLACEMENT, LEFT;  Surgeon: Hollice Espy, MD;  Location: [facility] ORS;  Service: Urology;  Laterality: Left;  . INGUINAL HERNIA REPAIR Left 09/16/2018   Procedure: HERNIA REPAIR INGUINAL ADULT;  Surgeon: Herbert Pun, MD;  Location: [facility] ORS;  Service: General;  Laterality: Left;    There were no vitals filed for this visit.   Subjective Assessment - 08/19/19 0849    Subjective Patient reports increased fatigue after last session especially after picking up eggs. He reports no soreness or pain; Reports that he feels as though his left foot is getting a little better;    Pertinent History Patient is a pleasant 74 year old male who presents for CVA. Patient had a R CVA on memorial day weekend May 30th (Sunday before memorial day). Woke up and left leg went out from underneath from him. Left leg  and arm affected. Went to the next morning, waited for 6 hours in emergency room. Has had some stumbles but no falls.  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 Has been keeping as active as possible and is improving.    Limitations Lifting;Standing;Walking;House hold activities    How long can you sit comfortably? n/a    How long can you stand comfortably? not limited    How long can you walk comfortably? 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    Patient Stated Goals get better foot control.    Currently in Pain? No/denies    Multiple Pain Sites No              [facility] PT Assessment - 08/19/19 0001      Observation/Other Assessments   Focus on Therapeutic Outcomes (FOTO)  63%             TREATMENT: Warm up on Crosstrainer, level 2 x4 min (Unbilled);  Instructed patient in LE ankle strengthening/balance activities to facilitate improved ankle control; NMR: Standing beside steps;  Standing on airex:  - unsupported heel/toe  raises  3 sec hold x10 reps; -standing on left leg with right leg on ball:             Unsupported standing 15 sec hold x1 reps;             Progressed to LE ball circles x10 reps each LE x2 sets;  Negotiated steps forward reciprocal #4 x2 sets with and without rails;  EX: Standing on steps: BLE heel raise, lifting RLE and slowly eccentric lower LLE single leg x15 reps with B rail assist for safety;  Seated: Long sitting LLE ankle DF blue tband2x15reps each; required min VCs to isolate ankle ROM; patient did have increased difficulty with ankle EV  Long  sitting: RLE ankle IV red tband 2x15, improved AROM noted, does require min A to avoid hip rotation;  Instructed patient in long sitting LLE ankle EV red tband 2x15 reps with  improvement in AROM noted;  Long sitting ankle circles clockwise/counterclockwise with cues to use hands to start motion and then try to keep moving LLE ankle without assistance; Reinforced HEP;   Standing: Green tband around BLE: -hip abduction 2x10 -hip extension 2x10 -hip flexion 2x10; Patient required min-moderate verbal/tactile cues for correct exercise technique.  Leg press: BLE plate 100# U13 LLE only plate: 55#, 2x12 Patient required min-moderate verbal/tactile cues for correct exercise technique.  Response to treatment: patient tolerated well. He is progressing in improved ankle AROM with resisted exercise. Patient does require min VCs for proper positioning/exercise technique; Pt instructed in advanced balance activities utilizing compliant surface to challenge ankle stabilization. He does require min A when standing without rail assist. Patient was able to tolerate increased weight bearing in LLE with less difficulty this session; He would benefit from additional skilled PT intervention to improve strength, balance and mobility;                        PT Education - 08/19/19 0850    Education Details exercise technique, strengthening;    Person(s) Educated Patient    Methods Explanation;Verbal cues    Comprehension Verbalized understanding;Returned demonstration;Verbal cues required;Need further instruction            PT Short Term Goals - 07/30/19 1750      PT SHORT TERM GOAL #1   Title Patient will be independent in home exercise program to improve strength/mobility for better functional independence with ADLs.    Baseline 07/30/19: HEP given    Time 4    Period Weeks    Status New    Target Date 08/27/19      PT SHORT TERM GOAL #2   Title Patient (> 69 years old) will  complete five times sit to stand test in < 15 seconds indicating an increased LE strength and improved balance.    Baseline 6/23: 16.47 sec with SUE support    Time 4    Period Weeks    Status New    Target Date 08/27/19             PT Long Term Goals - 07/30/19 1751      PT LONG TERM GOAL #1   Title Patient will increase FOTO score to equal to or greater than  69%   to demonstrate statistically significant improvement in mobility and quality of life.    Baseline 6/23: 56%    Time 8    Period Weeks    Status New    Target Date 09/24/19  PT LONG TERM GOAL #2   Title Patient will increase 10 meter walk test to >1.70m/s as to improve gait speed for better community ambulation and to reduce fall risk.    Baseline 0.71 m/s    Time 8    Period Weeks    Status New    Target Date 09/24/19      PT LONG TERM GOAL #3   Title Patient will increase six minute walk test distance to >1000 for progression to community ambulator and improve gait ability    Baseline 6/23: 855 ft    Time 8    Period Weeks    Status New    Target Date 09/24/19      PT LONG TERM GOAL #4   Title Patient will increase BLE gross strength to 4+/5 as to improve functional strength for independent gait, increased standing tolerance and increased ADL ability.    Baseline 6/23: see document    Time 8    Period Weeks    Status New    Target Date 09/24/19                 Plan - 08/19/19 0934    Clinical Impression Statement Patient motivated and participated well within session. Instructed patient in advanced LE strengthening with increased resistance/repetition. he does require min VCs for proper positioning and exercise technique. PT progressed strengthening to include hip strengthening this session. consider adding that to HEP next session. Patient does have difficulty with IV/EV control having trouble standing on compliant surfaces. He does require min A with less rail assist when on compliant  surfaces. He would benefit from additional skilled PT Intervention to improve strength, balance and mobility;    Personal Factors and Comorbidities Age;Finances;Comorbidity 2;Profession;Time since onset of injury/illness/exacerbation    Comorbidities arthritis, HTN    Examination-Activity Limitations Caring for Others;Bend;Carry;Dressing;Hygiene/Grooming;Lift;Locomotion Level;Reach Overhead;Squat;Stairs;Stand;Transfers    Examination-Participation Restrictions Church;Cleaning;Community Activity;Laundry;Volunteer;Shop;Meal Prep;Yard Work    [employer] officer Evolving/Moderate complexity    Rehab Potential Fair    PT Frequency 2x / week    PT Duration 8 weeks    PT Treatment/Interventions ADLs/Self Care Home Management;Aquatic Therapy;Biofeedback;Cryotherapy;Electrical Stimulation;Iontophoresis 4mg /ml Dexamethasone;Canalith Repostioning;Moist Heat;Traction;Ultrasound;DME Instruction;Gait training;Stair training;Functional mobility training;Therapeutic activities;Patient/family education;Cognitive remediation;Neuromuscular re-education;Balance training;Therapeutic exercise;Orthotic Fit/Training;Manual techniques;Splinting;Energy conservation;Dry needling;Passive range of motion;Taping;Vasopneumatic Device;Vestibular    PT Next Visit Plan LLE strengthening, coordination, ankle strategies    PT Home Exercise Plan see above    Consulted and Agree with Plan of Care Patient           Patient will benefit from skilled therapeutic intervention in order to improve the following deficits and impairments:  Abnormal gait, Cardiopulmonary status limiting activity, Decreased activity tolerance, Decreased balance, Decreased endurance, Decreased coordination, Decreased knowledge of use of DME, Decreased mobility, Difficulty walking, Decreased strength, Impaired flexibility, Impaired perceived functional ability, Impaired sensation, Impaired UE functional use, Postural dysfunction, Improper body  mechanics  Visit Diagnosis: Unsteadiness on feet  Muscle weakness (generalized)  Other abnormalities of gait and mobility     Problem List Patient Active Problem List   Diagnosis Date Noted  . Borderline diabetes mellitus 09/11/2018  . Hyperlipemia, mixed 09/11/2018  . Tobacco dependence 09/11/2018  . Abdominal aortic aneurysm (AAA) without rupture ([place]) 06/11/2018  . Chronic systolic CHF (congestive heart failure), NYHA class 2 ([place]) 10/30/2016  . Paroxysmal A-fib ([place]) 03/10/2015  . Benign essential hypertension 08/27/2014    , PT, DPT 08/19/2019, 9:48 AM  [place] [facility] [address]  [place], [place], [address] Phone: [phone]   Fax:  [phone]  Name: ALEKSANDAR DUVE MRN: [id number] Date of Birth: 1945/07/16

## 2019-08-22 ENCOUNTER — Ambulatory Visit: Payer: Medicare Other

## 2019-08-22 ENCOUNTER — Other Ambulatory Visit: Payer: Self-pay

## 2019-08-22 DIAGNOSIS — R2681 Unsteadiness on feet: Secondary | ICD-10-CM | POA: Diagnosis not present

## 2019-08-22 DIAGNOSIS — M6281 Muscle weakness (generalized): Secondary | ICD-10-CM

## 2019-08-22 DIAGNOSIS — R2689 Other abnormalities of gait and mobility: Secondary | ICD-10-CM

## 2019-08-22 NOTE — Therapy (Signed)
[place] MAIN [facility] SERVICES [address] [place], [place], [address] Phone: [phone]   Fax:  [phone]  Physical Therapy Treatment  Patient Details  Name: Samuel Boyd MRN: [id number] Date of Birth: April 19, 1945 Referring Provider (PT): Jennings Books MD   Encounter Date: 08/22/2019   PT End of Session - 08/22/19 0918    Visit Number 7    Number of Visits 16    Date for PT Re-Evaluation 09/24/19    Authorization Type 7/10 eval 07/30/19    Authorization Time Period FOTO PT    PT Start Time 0915    PT Stop Time 0959    PT Time Calculation (min) 44 min    Equipment Utilized During Treatment Gait belt    Activity Tolerance Patient tolerated treatment well    Behavior During Therapy [facility] for tasks assessed/performed           Past Medical History:  Diagnosis Date  . Arthritis   . Dysrhythmia   . History of kidney stones   . Hypertension   . Pneumonia     Past Surgical History:  Procedure Laterality Date  . CYSTOSCOPY/URETEROSCOPY/HOLMIUM LASER/STENT PLACEMENT Left 09/16/2018   Procedure: CYSTOSCOPY/URETEROSCOPY/HOLMIUM LASER/STENT PLACEMENT, LEFT;  Surgeon: Hollice Espy, MD;  Location: [facility] ORS;  Service: Urology;  Laterality: Left;  . INGUINAL HERNIA REPAIR Left 09/16/2018   Procedure: HERNIA REPAIR INGUINAL ADULT;  Surgeon: Herbert Pun, MD;  Location: [facility] ORS;  Service: General;  Laterality: Left;    There were no vitals filed for this visit.   Subjective Assessment - 08/22/19 0917    Subjective Patient reports no falls or LOB, reports no soreness or fatigue after last session. [place] next week.    Pertinent History Patient is a pleasant 74 year old male who presents for CVA. Patient had a R CVA on memorial day weekend May 30th (Sunday before memorial day). Woke up and left leg went out from underneath from him. Left leg and arm affected. Went to the next morning, waited for 6 hours  in emergency room. Has had some stumbles but no falls.  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 Has been keeping as active as possible and is improving.    Limitations Lifting;Standing;Walking;House hold activities    How long can you sit comfortably? n/a    How long can you stand comfortably? not limited    How long can you walk comfortably? 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    Patient Stated Goals get better foot control.    Currently in Pain? No/denies                 TREATMENT: Warm up on Crosstrainer, level 4 x3 min for cardiovascular challenge:  Review and perform new HEP:     Access Code: [email or web address] URL: [email or web address] Date: 08/22/2019 Prepared by: Janna Arch  Exercises Standing Hip Extension with Resistance at Ankles and Counter Support - 1 x daily - 7 x weekly - 2 sets - 10 reps - 5 hold Standing Hip Abduction with Resistance at Ankles and Unilateral Counter Support - 1 x daily - 7 x weekly -  2 sets - 10 reps - 5 hold Standing Hip Flexion with Resistance Loop - 1 x daily - 7 x weekly - 2 sets - 10 reps - 5 hold Lunge with Counter Support - 1 x daily - 7 x weekly - 2 sets - 10 reps - 5 hold Mini Squat with Counter Support - 1 x daily - 7 x weekly - 2 sets - 10 reps - 5 hold  Continuation of treatment:  Monster walks RTB 4x length of // bars cues for hip/knee flexion with core activation ; upright posture   SLS tipping Tennis ball off cone for fine skill of L ankle df/pf and coordination 10x   16" step with airex pad on top step ups no UE support;  10x each LE     Pt educated throughout session about proper posture and technique with exercises. Improved exercise technique, movement at target joints, use of target muscles after min to mod verbal, visual, tactile cues.    PT Education - 08/22/19 0918    Education Details exercise technique, strengthening, HEP    Person(s) Educated Patient    Methods Explanation;Demonstration;Tactile cues;Verbal cues    Comprehension Verbalized understanding;Returned demonstration;Verbal cues required;Tactile cues required            PT Short Term Goals - 07/30/19 1750      PT SHORT TERM GOAL #1   Title Patient will be independent in home exercise program to improve strength/mobility for better functional independence with ADLs.    Baseline 07/30/19: HEP given    Time 4    Period Weeks    Status New    Target Date 08/27/19      PT SHORT TERM GOAL #2   Title Patient (> 33 years old) will complete five times sit to stand test in < 15 seconds indicating an increased LE strength and improved balance.    Baseline 6/23: 16.47 sec with SUE support    Time 4    Period Weeks    Status New    Target Date 08/27/19             PT Long Term Goals - 07/30/19 1751      PT LONG TERM GOAL #1   Title Patient will increase FOTO score to equal to or greater than  69%   to demonstrate statistically significant improvement in mobility and quality of life.    Baseline 6/23: 56%    Time 8    Period Weeks    Status New    Target Date 09/24/19      PT LONG TERM GOAL #2   Title Patient will increase 10 meter walk test to >1.23m/s as to improve gait speed for better community ambulation and to reduce fall risk.    Baseline 0.71 m/s    Time 8    Period Weeks    Status New    Target Date 09/24/19      PT LONG TERM GOAL #3   Title Patient will increase six minute walk test distance to >1000 for progression to community ambulator and improve gait ability    Baseline 6/23: 855 ft    Time 8    Period  Weeks    Status New    Target Date 09/24/19      PT LONG TERM GOAL #4   Title Patient will increase BLE gross strength to 4+/5 as to improve functional strength for independent gait, increased standing tolerance and increased ADL ability.  Baseline 6/23: see document    Time 8    Period Weeks    Status New    Target Date 09/24/19                 Plan - 08/22/19 1210    Clinical Impression Statement Patient given new HEP for progression of LE strengthening with resistance. Progression of interventions tolerated well with patient requiring occasional seated rest breaks with prolonged single limb stance.  Improved foot/ankle control of LLE noted with improving spatial awareness and activation of gentle muscle activation. He would benefit from additional skilled PT Intervention to improve strength, balance and mobility;    Personal Factors and Comorbidities Age;Finances;Comorbidity 2;Profession;Time since onset of injury/illness/exacerbation    Comorbidities arthritis, HTN    Examination-Activity Limitations Caring for Others;Bend;Carry;Dressing;Hygiene/Grooming;Lift;Locomotion Level;Reach Overhead;Squat;Stairs;Stand;Transfers    Examination-Participation Restrictions Church;Cleaning;Community Activity;Laundry;Volunteer;Shop;Meal Prep;Yard Work    [employer]chant [employer] officer Evolving/Moderate complexity    Rehab Potential Fair    PT Frequency 2x / week    PT Duration 8 weeks    PT Treatment/Interventions ADLs/Self Care Home Management;Aquatic Therapy;Biofeedback;Cryotherapy;Electrical Stimulation;Iontophoresis 4mg /ml Dexamethasone;Canalith Repostioning;Moist Heat;Traction;Ultrasound;DME Instruction;Gait training;Stair training;Functional mobility training;Therapeutic activities;Patient/family education;Cognitive remediation;Neuromuscular re-education;Balance training;Therapeutic exercise;Orthotic Fit/Training;Manual techniques;Splinting;Energy conservation;Dry needling;Passive  range of motion;Taping;Vasopneumatic Device;Vestibular    PT Next Visit Plan LLE strengthening, coordination, ankle strategies    PT Home Exercise Plan see above    Consulted and Agree with Plan of Care Patient           Patient will benefit from skilled therapeutic intervention in order to improve the following deficits and impairments:  Abnormal gait, Cardiopulmonary status limiting activity, Decreased activity tolerance, Decreased balance, Decreased endurance, Decreased coordination, Decreased knowledge of use of DME, Decreased mobility, Difficulty walking, Decreased strength, Impaired flexibility, Impaired perceived functional ability, Impaired sensation, Impaired UE functional use, Postural dysfunction, Improper body mechanics  Visit Diagnosis: Unsteadiness on feet  Muscle weakness (generalized)  Other abnormalities of gait and mobility     Problem List Patient Active Problem List   Diagnosis Date Noted  . Borderline diabetes mellitus 09/11/2018  . Hyperlipemia, mixed 09/11/2018  . Tobacco dependence 09/11/2018  . Abdominal aortic aneurysm (AAA) without rupture ([place]) 06/11/2018  . Chronic systolic CHF (congestive heart failure), NYHA class 2 ([place]) 10/30/2016  . Paroxysmal A-fib ([place]) 03/10/2015  . Benign essential hypertension 08/27/2014   Janna Arch, PT, DPT   08/22/2019, 12:11 PM  [place] [facility] SERVICES [address] [place], [place], [address] Phone: [phone]   Fax:  [phone]  Name: Samuel Boyd MRN: [id number] Date of Birth: 08-13-1945

## 2019-08-26 ENCOUNTER — Ambulatory Visit: Payer: Medicare Other | Admitting: Occupational Therapy

## 2019-08-26 ENCOUNTER — Encounter: Payer: Self-pay | Admitting: Occupational Therapy

## 2019-08-26 ENCOUNTER — Ambulatory Visit: Payer: Medicare Other | Admitting: Physical Therapy

## 2019-08-26 ENCOUNTER — Other Ambulatory Visit: Payer: Self-pay

## 2019-08-26 ENCOUNTER — Encounter: Payer: Self-pay | Admitting: Physical Therapy

## 2019-08-26 DIAGNOSIS — R2689 Other abnormalities of gait and mobility: Secondary | ICD-10-CM

## 2019-08-26 DIAGNOSIS — M6281 Muscle weakness (generalized): Secondary | ICD-10-CM

## 2019-08-26 DIAGNOSIS — R2681 Unsteadiness on feet: Secondary | ICD-10-CM

## 2019-08-26 DIAGNOSIS — R278 Other lack of coordination: Secondary | ICD-10-CM

## 2019-08-26 NOTE — Therapy (Signed)
[place] MAIN [facility] SERVICES [address] [place], [place], [address] Phone: [phone]   Fax:  [phone]  Physical Therapy Treatment  Patient Details  Name: Samuel Boyd MRN: [id number] Date of Birth: [birth year] Referring Provider (PT): Jennings Books MD   Encounter Date: 08/26/2019   PT End of Session - 08/26/19 0851    Visit Number 8    Number of Visits 16    Date for PT Re-Evaluation 09/24/19    Authorization Type 8/10 eval 07/30/19    Authorization Time Period FOTO PT    PT Start Time [id number]    PT Stop Time 0930    PT Time Calculation (min) 44 min    Equipment Utilized During Treatment Gait belt    Activity Tolerance Patient tolerated treatment well    Behavior During Therapy [facility] for tasks assessed/performed           Past Medical History:  Diagnosis Date  . Arthritis   . Dysrhythmia   . History of kidney stones   . Hypertension   . Pneumonia     Past Surgical History:  Procedure Laterality Date  . CYSTOSCOPY/URETEROSCOPY/HOLMIUM LASER/STENT PLACEMENT Left 09/16/2018   Procedure: CYSTOSCOPY/URETEROSCOPY/HOLMIUM LASER/STENT PLACEMENT, LEFT;  Surgeon: Hollice Espy, MD;  Location: [facility] ORS;  Service: Urology;  Laterality: Left;  . INGUINAL HERNIA REPAIR Left 09/16/2018   Procedure: HERNIA REPAIR INGUINAL ADULT;  Surgeon: Herbert Pun, MD;  Location: [facility] ORS;  Service: General;  Laterality: Left;    There were no vitals filed for this visit.   Subjective Assessment - 08/26/19 0851    Subjective Patient reports doing well; Denies any falls; Reports that he was really tired yesterday but is better now.    Pertinent History Patient is a pleasant 74 year old male who presents for CVA. Patient had a R CVA on memorial day weekend May 30th (Sunday before memorial day). Woke up and left leg went out from underneath from him. Left leg and arm affected. Went to the next morning, waited for 6 hours in emergency room.  Has had some stumbles but no falls.  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 Has been keeping as active as possible and is improving.    Limitations Lifting;Standing;Walking;House hold activities    How long can you sit comfortably? n/a    How long can you stand comfortably? not limited    How long can you walk comfortably? 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    Patient Stated Goals get better foot control.    Currently in Pain? No/denies    Multiple Pain Sites No               TREATMENT: Warm up on Crosstrainer, level 2 x4 min (Unbilled);  Instructed patient in LE ankle strengthening/balance activities to facilitate improved ankle control; NMR: Standing on BAPs board, L3, LLE only: DF/PF x10 reps; IV/EV x10 reps Clockwise circles x5 reps;  Forward lunges to BOSU x10 reps leading with LLE Standing on BOSU with LLE:  -RLE hip flexion march x10 reps with reducing HHA BLE heel raises unsupported x15 reps with increased weight shift to RLE;  EX: Seated green  tband ankle DF x20 reps on LLE only;   Standing: Green tband around BLE: -hip abduction x12 -hip extension x12 Patient required min-moderate verbal/tactile cues for correct exercise technique including to avoid trunk lean for better hip strengthening.  Standing outside parallel bars: Diagonal stepping forward/backward over 2x4 with green tband around ankles to challenge LE strength x2 laps each direction with CGA for safety; Patient does exhibit increased difficulty lifting LLE with decreased motor control;   Standing by  parallel bars Side stepping on 2x4 with heel/toe off to challenge ankle control x3 laps each direction;  Required CGA for safety; Patient does have difficulty increasing LLE ankle DF/PF activation due to weakness;   Leg press: BLE plate 100# E26 LLE only plate: 55#, 2x15 LLE only heel raises 55# 2x10 Patient required min-moderate verbal/tactile cues for correct exercise technique.  Response to treatment: patient toleratedwell. Patient does require min VCs for proper exercise technique/positioning. He continues to have difficulty increasing LLE ankle AROM often compensation with increased RLE ankle ROM. Denies any pain at end of session;                        PT Education - 08/26/19 0851    Education Details LE strengthening, HEP    Person(s) Educated Patient    Methods Explanation;Verbal cues    Comprehension Verbalized understanding;Returned demonstration;Verbal cues required;Need further instruction            PT Short Term Goals - 07/30/19 1750      PT SHORT TERM GOAL #1   Title Patient will be independent in home exercise program to improve strength/mobility for better functional independence with ADLs.    Baseline 07/30/19: HEP given    Time 4    Period Weeks    Status New    Target Date 08/27/19      PT SHORT TERM GOAL #2   Title Patient (> 3 years old) will complete five times sit to stand test in < 15 seconds indicating an increased LE strength and improved balance.    Baseline 6/23: 16.47 sec with SUE support    Time 4    Period Weeks    Status New    Target Date 08/27/19             PT Long Term Goals - 07/30/19 1751      PT LONG TERM GOAL #1   Title Patient will increase FOTO score to equal to or greater than  69%   to demonstrate statistically significant improvement in mobility and quality of life.    Baseline 6/23: 56%    Time 8    Period Weeks    Status New    Target Date 09/24/19      PT LONG TERM GOAL #2   Title Patient  will increase 10 meter walk test to >1.61m/s as to improve gait speed for better community ambulation and to reduce fall risk.    Baseline 0.71 m/s    Time 8    Period Weeks    Status New    Target Date 09/24/19      PT LONG TERM GOAL #3   Title Patient will increase six minute walk test distance to >1000 for progression to community ambulator and improve gait ability    Baseline 6/23: 855 ft    Time 8    Period Weeks    Status New    Target Date 09/24/19  PT LONG TERM GOAL #4   Title Patient will increase BLE gross strength to 4+/5 as to improve functional strength for independent gait, increased standing tolerance and increased ADL ability.    Baseline 6/23: see document    Time 8    Period Weeks    Status New    Target Date 09/24/19                 Plan - 08/26/19 0943    Clinical Impression Statement Patient is progressing well. He was instructed in advanced LE strengthening exercise. patient does require min VCs for proper exercise technique/positioning to isolate optimal muscle activation. He does have difficulty initiating LLE movement especially in ankle. Patient was instructed in advanced balance activities to challenge motor control in LLE ankle. Patient does require min A when standing on unstable surface for safety. He would benefit from additional skilled PT intervention to improve strength, balance and mobility;    Personal Factors and Comorbidities Age;Finances;Comorbidity 2;Profession;Time since onset of injury/illness/exacerbation    Comorbidities arthritis, HTN    Examination-Activity Limitations Caring for Others;Bend;Carry;Dressing;Hygiene/Grooming;Lift;Locomotion Level;Reach Overhead;Squat;Stairs;Stand;Transfers    Examination-Participation Restrictions Church;Cleaning;Community Activity;Laundry;Volunteer;Shop;Meal Prep;Yard Work    Merchant navy officer Evolving/Moderate complexity    Rehab Potential Fair    PT Frequency 2x / week    PT  Duration 8 weeks    PT Treatment/Interventions ADLs/Self Care Home Management;Aquatic Therapy;Biofeedback;Cryotherapy;Electrical Stimulation;Iontophoresis 4mg /ml Dexamethasone;Canalith Repostioning;Moist Heat;Traction;Ultrasound;DME Instruction;Gait training;Stair training;Functional mobility training;Therapeutic activities;Patient/family education;Cognitive remediation;Neuromuscular re-education;Balance training;Therapeutic exercise;Orthotic Fit/Training;Manual techniques;Splinting;Energy conservation;Dry needling;Passive range of motion;Taping;Vasopneumatic Device;Vestibular    PT Next Visit Plan LLE strengthening, coordination, ankle strategies    PT Home Exercise Plan see above    Consulted and Agree with Plan of Care Patient           Patient will benefit from skilled therapeutic intervention in order to improve the following deficits and impairments:  Abnormal gait, Cardiopulmonary status limiting activity, Decreased activity tolerance, Decreased balance, Decreased endurance, Decreased coordination, Decreased knowledge of use of DME, Decreased mobility, Difficulty walking, Decreased strength, Impaired flexibility, Impaired perceived functional ability, Impaired sensation, Impaired UE functional use, Postural dysfunction, Improper body mechanics  Visit Diagnosis: Unsteadiness on feet  Muscle weakness (generalized)  Other abnormalities of gait and mobility     Problem List Patient Active Problem List   Diagnosis Date Noted  . Borderline diabetes mellitus 09/11/2018  . Hyperlipemia, mixed 09/11/2018  . Tobacco dependence 09/11/2018  . Abdominal aortic aneurysm (AAA) without rupture ([place]) 06/11/2018  . Chronic systolic CHF (congestive heart failure), NYHA class 2 ([place]) 10/30/2016  . Paroxysmal A-fib ([place]) 03/10/2015  . Benign essential hypertension 08/27/2014    , PT, DPT 08/26/2019, 9:46 AM  [place] MAIN [facility] SERVICES [address] [place], [place], [address] Phone: [phone]   Fax:  [phone]  Name: SHIMSHON NARULA MRN: [id number] Date of Birth: 02/02/1946

## 2019-08-26 NOTE — Therapy (Signed)
[place] MAIN [facility] SERVICES [address] [place], [place], [address] Phone: [phone]   Fax:  [phone]  Occupational Therapy Evaluation  Patient Details  Name: Samuel Boyd MRN: [id number] Date of Birth: 1946-01-14 Referring Provider (OT): Jennings Books   Encounter Date: 08/26/2019   OT End of Session - 08/26/19 0936    Visit Number 1    Number of Visits 24    Date for OT Re-Evaluation 11/18/19    OT Start Time 0930    OT Stop Time 1015    OT Time Calculation (min) 45 min    Activity Tolerance Patient tolerated treatment well    Behavior During Therapy [facility] for tasks assessed/performed           Past Medical History:  Diagnosis Date  . Arthritis   . Dysrhythmia   . History of kidney stones   . Hypertension   . Pneumonia     Past Surgical History:  Procedure Laterality Date  . CYSTOSCOPY/URETEROSCOPY/HOLMIUM LASER/STENT PLACEMENT Left 09/16/2018   Procedure: CYSTOSCOPY/URETEROSCOPY/HOLMIUM LASER/STENT PLACEMENT, LEFT;  Surgeon: Hollice Espy, MD;  Location: [facility] ORS;  Service: Urology;  Laterality: Left;  . INGUINAL HERNIA REPAIR Left 09/16/2018   Procedure: HERNIA REPAIR INGUINAL ADULT;  Surgeon: Herbert Pun, MD;  Location: [facility] ORS;  Service: General;  Laterality: Left;    There were no vitals filed for this visit.   Subjective Assessment - 08/26/19 0933    Subjective  Patient reports he had a stroke in may and now has some left sided weakness and difficulty with coordination at times.    Pertinent History Patient is a pleasant 74 year old male who presents for CVA. Patient had a R CVA on Memorial Day weekend May 30th (Sunday before memorial day). Woke up and left leg went out from underneath from him. Left leg and arm affected. Went to the next morning, waited for 6 hours in emergency room. Has had some stumbles but no falls.  Right Anterior Cerebral Artery distribution embolic infract with previous  history of lacunar strokes in right cerebellum and left caudate nucleus and left Middle Cerebral Artery posterior division embolic cortical infract.  Patient has been receiving PT for the last 3-4 weeks.  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    Patient Stated Goals Patient would like to regain strength and use his left UE as he did before, be independent with all tasks.             [facility] OT Assessment - 08/26/19 0938      Assessment   Medical Diagnosis Right Anterior Cerebral Artery CVA    Referring Provider (OT) Manuella Ghazi, [place]    Onset Date/Surgical Date 07/06/19    Hand Dominance Right    Prior Therapy no      Precautions   Precautions Fall      Restrictions   Weight Bearing Restrictions No      Balance Screen   Has the patient fallen in the past 6 months Yes    How many times? 1 when having CVA    Has the patient had a decrease in activity level because of a fear of falling?  No    Is the patient reluctant to leave their home because of a fear of falling?  No      Home  Environment   Family/patient expects to be discharged to: Private residence    Living Arrangements Spouse/significant other    Available Help at Discharge  Family    Type of [place] One level    Bathroom Shower/Tub Tub/Shower unit;Door    [employer]    Lives With Spouse      Prior Function   Level of Independence Independent    Vocation Full time employment    Vocation Requirements lifting, walking, heavy moving     Leisure farming, spending time with grandchildren      ADL   ADL comments Independent with self care, performs yard work but reports being slower at completing outdoor tasks.  Able to drive short distances around his property.  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        IADL   Prior Level of Function Medication Managment independent     Medication Management Is responsible for taking medication in correct dosages at correct time      Mobility   Mobility Status Independent      Written Expression   Dominant Hand Right      Vision Assessment   Comment Denies any changes to vision      Cognition   Cognition Comments denies any changes in cognition      Sensation   Light Touch Appears Intact    Stereognosis Appears Intact    Hot/Cold Appears Intact    Additional Comments denies any numbness or tingling      Coordination   Gross Motor Movements are Fluid and Coordinated No    Fine Motor Movements are Fluid and Coordinated No    Finger Nose Finger Test left slight dysmetria    9 Hole Peg Test Right;Left    Right 9 Hole Peg Test 28 sec    Left 9 Hole Peg Test 38 sec    Coordination rapid alternating movements      AROM   Overall AROM Comments RUE WNL, LUE WFLs left shoulder flexion to 130 degrees      Strength   Overall Strength Comments RUE WNLs 5/5 strength overall, LUE 3+/5 overall       Hand Function   Right Hand Grip (lbs) 75    Right Hand Lateral Pinch 18 lbs    Right Hand 3 Point Pinch 15 lbs    Left Hand Grip (lbs) 51    Left Hand Lateral Pinch 20 lbs    Left 3 point pinch 15 lbs           Full finger flexion, extension bilaterally Opposition of thumb to digits intact bilaterally Left thumb flex, ext, radial and palmar ABD intact  Decreased coordination when attempting to pick up eggs moving on conveyor belt  Decreased quick movements with left arm with tasks Decreased speed, dexerity in left compared to right Difficulty at times with release of objects especially under pressure  Mild pain at the base of the [facility] joint, patient reports Arthritis and had issues in the past.   Coordination TX:  Pt seen for manipulation of coins to pick up from tabletop and place into resistive bank with left hand, cues to then use translatory skills of the hand to move items to palm and then using the  hand for storage with mild difficulty.   Resistive putty (green) to pull putty and seek out small objects in putty and removing with left hand with mild difficulty.  OT Education - 08/26/19 1115    Education Details plan of care, role of OT, goals    Person(s) Educated Patient    Methods Explanation    Comprehension Verbalized understanding               OT Long Term Goals - 08/26/19 1119      OT LONG TERM GOAL #1   Title Patient will be modified independent with home exercise program to improve strength and coordination back to near baseline.    Baseline no current program for UE    Time 12    Period Weeks    Status New    Target Date 11/18/19      OT LONG TERM GOAL #2   Title Patient will improve strength in LUE by 1 mm grade to complete yardwork tasks with modified independence.    Baseline increased difficulty at eval, decreased speed of tasks.    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    Baseline increased difficulty with managing eggs with left hand during work tasks.    Time 12    Period Weeks    Status New    Target Date 11/18/19      OT LONG TERM GOAL #4   Title Patient will demonstrate improvements with speed, dexterity and manipulation skills with use of coins with dropping 3 or less during task.    Baseline decreased speed and dexterity, dropping items occasionally.    Time 12    Period Weeks    Status New    Target Date 11/07/19      OT LONG TERM GOAL #5   Title Patient will demonstrate consistent release of grasp automatically when managing picking up and releasing eggs at work.    Baseline difficulty with automaticity of release of items when  performing work related tasks.    Time 6    Period Weeks    Status New    Target Date 10/07/19                 Plan - 08/26/19 1116    Clinical Impression Statement Patient is a 74 yo male who was diagnosed with a CVA May 30th, 2021 and has a history of past stroke.  Patient referred to OP OT for evaluation.  Patient presents with left sided hemiparesis, muscle weakness,  ROM, impaired coordination, decreased ability to perform ADL and IADL tasks with the same speed and dexterity prior to CVA.  Patient would benefit from skilled OT services to maximize safety and independence in necessary daily tasks at home, work and in the community.    OT Occupational Profile and History Detailed Assessment- Review of Records and additional review of physical, cognitive, psychosocial history related to current functional performance    Occupational performance deficits (Please refer to evaluation for details): IADL's;ADL's;Leisure;Work    Marketing executive / Function / Physical Skills ADL;Flexibility;ROM;UE functional use;FMC;Dexterity;GMC;Strength;Coordination;IADL    Psychosocial Skills Environmental  Adaptations;Routines and Behaviors;Habits    Rehab Potential Good    Clinical Decision Making Limited treatment options, no task modification necessary    Comorbidities Affecting Occupational Performance:  May have comorbidities impacting occupational performance    Modification or Assistance to Complete Evaluation  No modification of tasks or assist necessary to complete eval    OT Frequency 2x / week    OT Duration 12 weeks    OT Treatment/Interventions Self-care/ADL training;Therapeutic exercise;Moist Heat;Neuromuscular education;Patient/family education;Therapeutic activities;Cryotherapy;Contrast Bath;DME and/or AE instruction;Manual Therapy    Consulted and Agree with Plan of Care Patient           Patient will benefit from skilled therapeutic intervention in order to improve the following  deficits and impairments:   Body Structure / Function / Physical Skills: ADL, Flexibility, ROM, UE functional use, FMC, Dexterity, GMC, Strength, Coordination, IADL   Psychosocial Skills: Environmental  Adaptations, Routines and Behaviors, Habits   Visit Diagnosis: Muscle weakness (generalized)  Other lack of coordination    Problem List Patient Active Problem List   Diagnosis Date Noted  . Borderline diabetes mellitus 09/11/2018  . Hyperlipemia, mixed 09/11/2018  . Tobacco dependence 09/11/2018  . Abdominal aortic aneurysm (AAA) without rupture ([place]) 06/11/2018  . Chronic systolic CHF (congestive heart failure), NYHA class 2 ([place]) 10/30/2016  . Paroxysmal A-fib ([place]) 03/10/2015  . Benign essential hypertension 08/27/2014   Achilles Dunk, OTR/L, CLT  , 08/26/2019, 11:27 AM  [place] MAIN [facility] SERVICES [address] [place], [place], [address] Phone: [phone]   Fax:  [phone]  Name: Samuel Boyd MRN: [id number] Date of Birth: Aug 16, 1945

## 2019-08-29 ENCOUNTER — Ambulatory Visit: Payer: Medicare Other

## 2019-08-29 DIAGNOSIS — M6281 Muscle weakness (generalized): Secondary | ICD-10-CM

## 2019-08-29 DIAGNOSIS — R2689 Other abnormalities of gait and mobility: Secondary | ICD-10-CM

## 2019-08-29 DIAGNOSIS — R2681 Unsteadiness on feet: Secondary | ICD-10-CM

## 2019-08-29 DIAGNOSIS — R278 Other lack of coordination: Secondary | ICD-10-CM

## 2019-08-29 NOTE — Therapy (Signed)
[place] MAIN [facility] SERVICES [address] [place], [place], [address] Phone: [phone]   Fax:  [phone]  Physical Therapy Treatment  Patient Details  Name: Samuel Boyd MRN: [id number] Date of Birth: Oct 11, 1945 Referring Provider (PT): Jennings Books MD   Encounter Date: 08/29/2019   PT End of Session - 08/29/19 0852    Visit Number 9    Number of Visits 16    Date for PT Re-Evaluation 09/24/19    Authorization Type 9/10 eval 07/30/19    Authorization Time Period FOTO PT    PT Start Time 0846    PT Stop Time 0929    PT Time Calculation (min) 43 min    Equipment Utilized During Treatment Gait belt    Activity Tolerance Patient tolerated treatment well    Behavior During Therapy [facility] for tasks assessed/performed           Past Medical History:  Diagnosis Date  . Arthritis   . Dysrhythmia   . History of kidney stones   . Hypertension   . Pneumonia     Past Surgical History:  Procedure Laterality Date  . CYSTOSCOPY/URETEROSCOPY/HOLMIUM LASER/STENT PLACEMENT Left 09/16/2018   Procedure: CYSTOSCOPY/URETEROSCOPY/HOLMIUM LASER/STENT PLACEMENT, LEFT;  Surgeon: Hollice Espy, MD;  Location: [facility] ORS;  Service: Urology;  Laterality: Left;  . INGUINAL HERNIA REPAIR Left 09/16/2018   Procedure: HERNIA REPAIR INGUINAL ADULT;  Surgeon: Herbert Pun, MD;  Location: [facility] ORS;  Service: General;  Laterality: Left;    There were no vitals filed for this visit.   Subjective Assessment - 08/29/19 0849    Subjective Patient reports he has been walking the driveway working as fast as he can. No falls or LOB since last session.    Pertinent History Patient is a pleasant 74 year old male who presents for CVA. Patient had a R CVA on memorial day weekend May 30th (Sunday before memorial day). Woke up and left leg went out from underneath from him. Left leg and arm affected. Went to the next morning, waited for 6 hours in emergency  room. Has had some stumbles but no falls.  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 Has been keeping as active as possible and is improving.    Limitations Lifting;Standing;Walking;House hold activities    How long can you sit comfortably? n/a    How long can you stand comfortably? not limited    How long can you walk comfortably? 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    Patient Stated Goals get better foot control.    Currently in Pain? No/denies                TREATMENT: Warm up on Crosstrainer, level 6 x3 min for cardiovascular challenge:  Continuation of treatment in // bars: CGA with close guard for unstable surfaces  BOSU flat side up: static hold 30 seconds x 3 trials; decreasing support from finger tip to no support, cues for proper foot placement and reduction of tilt/wobble with core stability and ankle reactions  bosu round side up: modified with finger tip support crane lunge step up 10x each LE  airex tandem stance; one foot on  each color airex pad: weighted ball (6lb) chest press 10x each LE placement, overhead press 10x each LE placement  Single leg reach and pick up cone in front decrease to two finger support 5 trials. Challenging with patient reporting stretch in LLE   Opposite UE/LE raises occasional instability with LLE single limb stance 15x each side   Walking hamstring stretch 4x length of // bars; challenging for LLE   Speed walking intervals in hallway. 30 second fast 15 second slow x 5 minutes  Seated 6" step hamstring stretch 60 second hold LLE  Seated GTB  ER/IR with green small ball between knees, alternating LE's 15x each LE     Pt educated throughout session about proper posture and technique with exercises. Improved exercise technique, movement at target joints, use of target muscles after min to mod verbal, visual, tactile cues.                      PT Education - 08/29/19 0851    Education Details exercise mechanics, stability    Person(s) Educated Patient    Methods Explanation;Demonstration;Tactile cues;Verbal cues    Comprehension Verbalized understanding;Returned demonstration;Verbal cues required;Tactile cues required            PT Short Term Goals - 07/30/19 1750      PT SHORT TERM GOAL #1   Title Patient will be independent in home exercise program to improve strength/mobility for better functional independence with ADLs.    Baseline 07/30/19: HEP given    Time 4    Period Weeks    Status New    Target Date 08/27/19      PT SHORT TERM GOAL #2   Title Patient (> 63 years old) will complete five times sit to stand test in < 15 seconds indicating an increased LE strength and improved balance.    Baseline 6/23: 16.47 sec with SUE support    Time 4    Period Weeks    Status New    Target Date 08/27/19             PT Long Term Goals - 07/30/19 1751      PT LONG TERM GOAL #1   Title Patient will increase FOTO score to equal to or greater than  69%   to demonstrate statistically significant improvement in mobility and quality of life.    Baseline 6/23: 56%    Time 8    Period Weeks    Status New    Target Date 09/24/19      PT LONG TERM GOAL #2   Title Patient will increase 10 meter walk test to >1.59m/s as to improve gait speed for better community ambulation and to reduce fall risk.    Baseline 0.71 m/s    Time 8    Period Weeks    Status New    Target Date 09/24/19      PT LONG TERM GOAL #3   Title Patient will increase six minute walk test distance to >1000 for progression to community  ambulator and improve gait ability    Baseline 6/23: 855 ft    Time 8    Period Weeks    Status New    Target Date 09/24/19      PT LONG TERM GOAL #4   Title Patient will increase BLE gross strength to 4+/5 as to improve functional strength for independent gait, increased standing tolerance and increased ADL ability.    Baseline 6/23:  see document    Time 8    Period Weeks    Status New    Target Date 09/24/19                 Plan - 08/29/19 0936    Clinical Impression Statement Patient presents wit excellent motivation. Interval ambulation intervention performed with patient tolerating velocity changes well.Patient was instructed in advanced balance activities to challenge motor control in LLE ankle. Cross body coordination and single limb stability is challenging for patient. Maintaining COM on unstable surfaces is an area of continued progression.  He would benefit from additional skilled PT intervention to improve strength, balance and mobility;    Personal Factors and Comorbidities Age;Finances;Comorbidity 2;Profession;Time since onset of injury/illness/exacerbation    Comorbidities arthritis, HTN    Examination-Activity Limitations Caring for Others;Bend;Carry;Dressing;Hygiene/Grooming;Lift;Locomotion Level;Reach Overhead;Squat;Stairs;Stand;Transfers    Examination-Participation Restrictions Church;Cleaning;Community Activity;Laundry;Volunteer;Shop;Meal Prep;Yard Work    Merchant [employer] officer Evolving/Moderate complexity    Rehab Potential Fair    PT Frequency 2x / week    PT Duration 8 weeks    PT Treatment/Interventions ADLs/Self Care Home Management;Aquatic Therapy;Biofeedback;Cryotherapy;Electrical Stimulation;Iontophoresis 4mg /ml Dexamethasone;Canalith Repostioning;Moist Heat;Traction;Ultrasound;DME Instruction;Gait training;Stair training;Functional mobility training;Therapeutic activities;Patient/family education;Cognitive remediation;Neuromuscular  re-education;Balance training;Therapeutic exercise;Orthotic Fit/Training;Manual techniques;Splinting;Energy conservation;Dry needling;Passive range of motion;Taping;Vasopneumatic Device;Vestibular    PT Next Visit Plan LLE strengthening, coordination, ankle strategies    PT Home Exercise Plan see above    Consulted and Agree with Plan of Care Patient           Patient will benefit from skilled therapeutic intervention in order to improve the following deficits and impairments:  Abnormal gait, Cardiopulmonary status limiting activity, Decreased activity tolerance, Decreased balance, Decreased endurance, Decreased coordination, Decreased knowledge of use of DME, Decreased mobility, Difficulty walking, Decreased strength, Impaired flexibility, Impaired perceived functional ability, Impaired sensation, Impaired UE functional use, Postural dysfunction, Improper body mechanics  Visit Diagnosis: Muscle weakness (generalized)  Other lack of coordination  Unsteadiness on feet  Other abnormalities of gait and mobility     Problem List Patient Active Problem List   Diagnosis Date Noted  . Borderline diabetes mellitus 09/11/2018  . Hyperlipemia, mixed 09/11/2018  . Tobacco dependence 09/11/2018  . Abdominal aortic aneurysm (AAA) without rupture ([place]) 06/11/2018  . Chronic systolic CHF (congestive heart failure), NYHA class 2 ([place]) 10/30/2016  . Paroxysmal A-fib ([place]) 03/10/2015  . Benign essential hypertension 08/27/2014   Janna Arch, PT, DPT   08/29/2019, 11:58 AM  [place] MAIN [facility] SERVICES [address] [place], [place], [address] Phone: [phone]   Fax:  [phone]  Name: ELSON ULBRICH MRN: [id number] Date of Birth: 1945-10-30

## 2019-09-02 ENCOUNTER — Ambulatory Visit: Payer: Medicare Other

## 2019-09-02 ENCOUNTER — Other Ambulatory Visit: Payer: Self-pay

## 2019-09-02 DIAGNOSIS — M6281 Muscle weakness (generalized): Secondary | ICD-10-CM

## 2019-09-02 DIAGNOSIS — R2681 Unsteadiness on feet: Secondary | ICD-10-CM

## 2019-09-02 DIAGNOSIS — R278 Other lack of coordination: Secondary | ICD-10-CM

## 2019-09-02 DIAGNOSIS — R2689 Other abnormalities of gait and mobility: Secondary | ICD-10-CM

## 2019-09-02 NOTE — Therapy (Signed)
[place] MAIN [facility] SERVICES [address] [place], [place], [address] Phone: [phone]   Fax:  [phone]  Physical Therapy Treatment Physical Therapy Progress Note/DISCHARGE   Dates of reporting period  07/30/19   to   09/02/19  Patient Details  Name: Samuel Boyd MRN: [id number] Date of Birth: [birth year] Referring Provider (PT): Jennings Books MD   Encounter Date: 09/02/2019   PT End of Session - 09/02/19 0853    Visit Number 10    Number of Visits 16    Date for PT Re-Evaluation 09/24/19    Authorization Type 10/10 eval 07/30/19; next session 1/10 PN 7/27    Authorization Time Period FOTO PT    PT Start Time 0845    PT Stop Time 0927    PT Time Calculation (min) 42 min    Equipment Utilized During Treatment Gait belt    Activity Tolerance Patient tolerated treatment well    Behavior During Therapy WFL for tasks assessed/performed           Past Medical History:  Diagnosis Date   Arthritis    Dysrhythmia    History of kidney stones    Hypertension    Pneumonia     Past Surgical History:  Procedure Laterality Date   CYSTOSCOPY/URETEROSCOPY/HOLMIUM LASER/STENT PLACEMENT Left 09/16/2018   Procedure: CYSTOSCOPY/URETEROSCOPY/HOLMIUM LASER/STENT PLACEMENT, LEFT;  Surgeon: Hollice Espy, MD;  Location: [facility] ORS;  Service: Urology;  Laterality: Left;   INGUINAL HERNIA REPAIR Left 09/16/2018   Procedure: HERNIA REPAIR INGUINAL ADULT;  Surgeon: Herbert Pun, MD;  Location: [facility] ORS;  Service: General;  Laterality: Left;    There were no vitals filed for this visit.   Subjective Assessment - 09/02/19 0953    Subjective Patient reports compliance with HEP, eager for today to be his last PTsession.    Pertinent History Patient is a pleasant 74 year old male who presents for CVA. Patient had a R CVA on memorial day weekend May 30th (Sunday before memorial day). Woke up and left leg went out from underneath from  him. Left leg and arm affected. Went to the next morning, waited for 6 hours in emergency room. Has had some stumbles but no falls.  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 Has been keeping as active as possible and is improving.    Limitations Lifting;Standing;Walking;House hold activities    How long can you sit comfortably? n/a    How long can you stand comfortably? not limited    How long can you walk comfortably? 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    Patient Stated Goals get better foot control.    Currently in Pain? No/denies           Progress note Goals:  5x STS: 11.8 seconds  FOTO: 74. 5% 10 MWT: 6 seconds =1.67 m/s 6 min walk test: 1490  BLE strength  Left Right  Hip flexion 4 5/5  Hip Abduction 4- 5/5  Hip Adduction 4- 5/5  Knee Extension  4 5/5  Knee Flexion 3+ 5/5  DF 4 5/5  PF 4- 5/5   Eversion: 3+ Inversion:4-   DGI 23/24   [facility]  PT Assessment - 09/02/19 0001      Standardized Balance Assessment   Standardized Balance Assessment Dynamic Gait Index      Dynamic Gait Index   Level Surface Normal    Change in Gait Speed Normal    Gait with Horizontal Head Turns Normal    Gait with Vertical Head Turns Mild Impairment    Gait and Pivot Turn Normal    Step Over Obstacle Normal    Step Around Obstacles Normal    Steps Normal    Total Score 23          New tests: SLS LLE: 7 seconds heavy weight shift/tremble ; RLE 10 seconds   Patient's condition has the potential to improve in response to therapy. Maximum improvement is yet to be obtained. The anticipated  improvement is attainable and reasonable in a generally predictable time.  Patient reports 85% return to functional level; feels he is able to complete the rest of it himself      Access Code: [email or web address] URL: [email or web address] Date: 09/02/2019 Prepared by: Janna Arch  Program Notes Be sure to perform all exercises next to a countertop or sturdy piece of furniture in case you become unsteady.   Exercises Standing Heel Raise with Support - 1 x daily - 7 x weekly - 15 reps - 2 sets Standing Toe Raises at Chair - 1 x daily - 7 x weekly - 15 reps - 2 sets Mini Squat with Counter Support - 1 x daily - 7 x weekly - 15 reps - 2 sets Lunge with Counter Support - 1 x daily - 7 x weekly - 15 reps - 2 sets Standing Tandem Balance with Counter Support - 1 x daily - 7 x weekly - 2 reps - 2 sets - 30 hold Standing Single Leg Stance with Counter Support - 1 x daily - 7 x weekly - 2 reps - 2 sets - 30 hold Lateral Step Up - 1 x daily - 7 x weekly - 2 reps - 2 sets - 30 hold Braided Sidestepping - 1 x daily - 7 x weekly - 10 reps - 2 sets                    PT Education - 09/02/19 0954    Education Details discharge, goals    Person(s) Educated Patient    Methods Explanation;Demonstration;Tactile cues;Verbal cues;Handout    Comprehension Verbalized understanding;Returned demonstration;Verbal cues required;Tactile cues required            PT Short Term Goals - 09/02/19 0951      PT SHORT TERM GOAL #1   Title Patient will be independent in home exercise program to improve strength/mobility for better functional independence with ADLs.    Baseline 07/30/19: HEP given 7/27: HEP compliant    Time 4    Period Weeks    Status Achieved    Target Date 08/27/19      PT SHORT TERM GOAL #2   Title Patient (> 79 years old) will complete five times sit to stand test in < 15 seconds indicating an increased LE strength and improved balance.    Baseline 6/23: 16.47 sec  with SUE support 7/27: 11.8 seconds hands on knees    Time 4    Period Weeks    Status Achieved    Target Date 08/27/19             PT Long Term Goals - 09/02/19 6834  PT LONG TERM GOAL #1   Title Patient will increase FOTO score to equal to or greater than  69%   to demonstrate statistically significant improvement in mobility and quality of life.    Baseline 6/23: 56% 7/27: 74.5%    Time 8    Period Weeks    Status Achieved      PT LONG TERM GOAL #2   Title Patient will increase 10 meter walk test to >1.13ms as to improve gait speed for better community ambulation and to reduce fall risk.    Baseline 0.71 m/s 7/27: 1.67 m/s    Time 8    Period Weeks    Status Achieved      PT LONG TERM GOAL #3   Title Patient will increase six minute walk test distance to >1000 for progression to community ambulator and improve gait ability    Baseline 6/23: 855 ft 7/27: 1490    Time 8    Period Weeks    Status Achieved      PT LONG TERM GOAL #4   Title Patient will increase BLE gross strength to 4+/5 as to improve functional strength for independent gait, increased standing tolerance and increased ADL ability.    Baseline 6/23: see document 7/27: see note    Time 8    Period Weeks    Status Partially Met                 Plan - 09/02/19 0956    Clinical Impression Statement Patient has progressed with one goal and achieved the rest of the goals. He is confident and eager to discharge to a home program. Additional Hep given to progress stability at this time with patient demonstrating understanding. I will be happy to see this patient again in the future as needed.    Personal Factors and Comorbidities Age;Finances;Comorbidity 2;Profession;Time since onset of injury/illness/exacerbation    Comorbidities arthritis, HTN    Examination-Activity Limitations Caring for Others;Bend;Carry;Dressing;Hygiene/Grooming;Lift;Locomotion Level;Reach Overhead;Squat;Stairs;Stand;Transfers     Examination-Participation Restrictions Church;Cleaning;Community Activity;Laundry;Volunteer;Shop;Meal Prep;Yard Work    SMerchant navy officerEvolving/Moderate complexity    Rehab Potential Fair    PT Frequency 2x / week    PT Duration 8 weeks    PT Treatment/Interventions ADLs/Self Care Home Management;Aquatic Therapy;Biofeedback;Cryotherapy;Electrical Stimulation;Iontophoresis 448mml Dexamethasone;Canalith Repostioning;Moist Heat;Traction;Ultrasound;DME Instruction;Gait training;Stair training;Functional mobility training;Therapeutic activities;Patient/family education;Cognitive remediation;Neuromuscular re-education;Balance training;Therapeutic exercise;Orthotic Fit/Training;Manual techniques;Splinting;Energy conservation;Dry needling;Passive range of motion;Taping;Vasopneumatic Device;Vestibular    PT Next Visit Plan LLE strengthening, coordination, ankle strategies    PT Home Exercise Plan see above    Consulted and Agree with Plan of Care Patient           Patient will benefit from skilled therapeutic intervention in order to improve the following deficits and impairments:  Abnormal gait, Cardiopulmonary status limiting activity, Decreased activity tolerance, Decreased balance, Decreased endurance, Decreased coordination, Decreased knowledge of use of DME, Decreased mobility, Difficulty walking, Decreased strength, Impaired flexibility, Impaired perceived functional ability, Impaired sensation, Impaired UE functional use, Postural dysfunction, Improper body mechanics  Visit Diagnosis: Muscle weakness (generalized)  Other lack of coordination  Unsteadiness on feet  Other abnormalities of gait and mobility     Problem List Patient Active Problem List   Diagnosis Date Noted   Borderline diabetes mellitus 09/11/2018   Hyperlipemia, mixed 09/11/2018   Tobacco dependence 09/11/2018   Abdominal aortic aneurysm (AAA) without rupture ([place]05/06/2018   Chronic  systolic CHF (congestive heart failure), NYHA class 2 ([facility]09/24/2018   Par[place]-fib ([place]02/02/2015  Benign essential hypertension 08/27/2014   Janna Arch, PT, DPT   09/02/2019, 9:57 AM  [place] MAIN [facility] SERVICES [address] [place], [place], [address] Phone: [phone]   Fax:  [phone]  Name: Samuel Boyd MRN: [id number] Date of Birth: 1945/09/05

## 2019-09-04 ENCOUNTER — Other Ambulatory Visit: Payer: Self-pay

## 2019-09-04 ENCOUNTER — Encounter: Payer: Self-pay | Admitting: Occupational Therapy

## 2019-09-04 ENCOUNTER — Ambulatory Visit: Payer: Medicare Other | Admitting: Physical Therapy

## 2019-09-04 ENCOUNTER — Ambulatory Visit: Payer: Medicare Other | Admitting: Occupational Therapy

## 2019-09-04 DIAGNOSIS — R278 Other lack of coordination: Secondary | ICD-10-CM

## 2019-09-04 DIAGNOSIS — R2681 Unsteadiness on feet: Secondary | ICD-10-CM | POA: Diagnosis not present

## 2019-09-04 DIAGNOSIS — M6281 Muscle weakness (generalized): Secondary | ICD-10-CM

## 2019-09-04 NOTE — Therapy (Signed)
[place] MAIN [facility] SERVICES [address] [place], [place], [address] Phone: [phone]   Fax:  [phone]  Occupational Therapy Treatment/Discharge Summary   Patient Details  Name: Samuel Boyd MRN: [id number] Date of Birth: Mar 22, 1945 Referring Provider (OT): Jennings Books   Encounter Date: 09/04/2019   OT End of Session - 09/04/19 1019    Visit Number 2    Number of Visits 24    Date for OT Re-Evaluation 11/18/19    OT Start Time 1015    OT Stop Time 1100    OT Time Calculation (min) 45 min    Activity Tolerance Patient tolerated treatment well    Behavior During Therapy [facility] for tasks assessed/performed           Past Medical History:  Diagnosis Date  . Arthritis   . Dysrhythmia   . History of kidney stones   . Hypertension   . Pneumonia     Past Surgical History:  Procedure Laterality Date  . CYSTOSCOPY/URETEROSCOPY/HOLMIUM LASER/STENT PLACEMENT Left 09/16/2018   Procedure: CYSTOSCOPY/URETEROSCOPY/HOLMIUM LASER/STENT PLACEMENT, LEFT;  Surgeon: Hollice Espy, MD;  Location: [facility] ORS;  Service: Urology;  Laterality: Left;  . INGUINAL HERNIA REPAIR Left 09/16/2018   Procedure: HERNIA REPAIR INGUINAL ADULT;  Surgeon: Herbert Pun, MD;  Location: [facility] ORS;  Service: General;  Laterality: Left;    There were no vitals filed for this visit.   Subjective Assessment - 09/04/19 1018    Subjective  Patient reports stiffness in left hand but no pain.  Patient reports it is difficult to get to therapy and he reports his hand has made good improvements and feels he can focus on exercises at home.    Pertinent History Patient is a pleasant 74 year old male who presents for CVA. Patient had a R CVA on Memorial Day weekend May 30th (Sunday before memorial day). Woke up and left leg went out from underneath from him. Left leg and arm affected. Went to the next morning, waited for 6 hours in emergency room. Has had some  stumbles but no falls.  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  Patient has been receiving PT for the last 3-4 weeks.  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    Patient Stated Goals Patient would like to regain strength and use his left UE as he did before, be independent with all tasks.    Currently in Pain? No/denies          Patient requesting to have exercises at home to continue with therapy on his own with HEP.    Strengthening exercises with use of Red theraputty for gross grasping, lateral and 3 point pinch skills, written instructions provided.  Patient to perform one set of 10 reps for next 3-4 days and then increase to 2 sets of 10 reps if no problems. Red theraband exercises for shoulder ABD, diagonal patterns, elbow flexion/extension for 1 set of 10 reps.    Neuromuscular Reeducation: Manipulation of small pieces of [place] pegboard with small washers, collars and dowels with assembly with picking up one item at a time.  Patient working towards translatory skills of the hand.  Some mild difficulty sorting small pieces after using hand for storage.   Patient seen for manipulation of small glass bead to pick up and place into container, using hand for  storage/translatory skills with cues for proper form and technique.   Patient issued and instructed on home program for fine motor coordination skills and demonstrates understanding.     FOTO discharge score:  74   Response to tx:  Patient requesting to continue with exercises at home on his own and able to demonstrate understanding of exercises for UE strengthening and fine motor coordination skills.  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  Goals partially met and patient will continue with exercise program  at home.  Discharge from OT services at this time, please reconsult if needs arise.                    OT Education - 09/05/19 1511    Education Details HEP, theraband exs, theraputty ex, [place]    Person(s) Educated Patient    Methods Explanation;Demonstration    Comprehension Verbalized understanding;Returned demonstration               OT Long Term Goals - 09/05/19 1511      OT LONG TERM GOAL #1   Title Patient will be modified independent with home exercise program to improve strength and coordination back to near baseline.    Baseline no current program for UE    Time 12    Period Weeks    Status Achieved      OT LONG TERM GOAL #2   Title Patient will improve strength in LUE by 1 mm grade to complete yardwork tasks with modified independence.    Baseline increased difficulty at eval, decreased speed of tasks.    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    Baseline increased difficulty with managing eggs with left hand during work tasks.    Time 12    Period Weeks    Status On-going      OT LONG TERM GOAL #4   Title Patient will demonstrate improvements with speed, dexterity and manipulation skills with use of coins with dropping 3 or less during task.    Baseline decreased speed and dexterity, dropping items occasionally.    Time 12    Period Weeks    Status Achieved      OT LONG TERM GOAL #5   Title Patient will demonstrate consistent release of grasp automatically when managing picking up and releasing eggs at work.    Baseline difficulty with automaticity of release of items when performing work related tasks.    Time 6    Period Weeks    Status Achieved                 Plan - 09/04/19  1810    Clinical Impression Statement Patient requesting to continue with exercises at home on his own and able to demonstrate understanding of exercises for UE strengthening and fine motor coordination skills.  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  Goals partially met and patient will continue with exercise program at home.  Discharge from OT services at this time, please reconsult if needs  arise.    OT Occupational Profile and History Detailed Assessment- Review of Records and additional review of physical, cognitive, psychosocial history related to current functional performance    Occupational performance deficits (Please refer to evaluation for details): IADL's;ADL's;Leisure;Work    Marketing executive / Function / Physical Skills ADL;Flexibility;ROM;UE functional use;FMC;Dexterity;GMC;Strength;Coordination;IADL    Psychosocial Skills Environmental  Adaptations;Routines and Behaviors;Habits    Rehab Potential Good    Clinical Decision Making Limited treatment options, no task modification necessary    Comorbidities Affecting Occupational Performance: May have comorbidities impacting occupational performance    Modification or Assistance to Complete Evaluation  No modification of tasks or assist necessary to complete eval    OT Frequency 2x / week    OT Duration 12 weeks    OT Treatment/Interventions Self-care/ADL training;Therapeutic exercise;Moist Heat;Neuromuscular education;Patient/family education;Therapeutic activities;Cryotherapy;Contrast Bath;DME and/or AE instruction;Manual Therapy    Consulted and Agree with Plan of Care Patient           Patient will benefit from skilled therapeutic intervention in order to improve the following deficits and impairments:   Body Structure / Function / Physical Skills: ADL, Flexibility, ROM, UE functional use, FMC, Dexterity, GMC, Strength, Coordination, IADL   Psychosocial Skills:  Environmental  Adaptations, Routines and Behaviors, Habits   Visit Diagnosis: Muscle weakness (generalized)  Other lack of coordination    Problem List Patient Active Problem List   Diagnosis Date Noted  . Borderline diabetes mellitus 09/11/2018  . Hyperlipemia, mixed 09/11/2018  . Tobacco dependence 09/11/2018  . Abdominal aortic aneurysm (AAA) without rupture ([place]) 06/11/2018  . Chronic systolic CHF (congestive heart failure), NYHA class 2 ([place]) 10/30/2016  . Paroxysmal A-fib ([place]) 03/10/2015  . Benign essential hypertension 08/27/2014   Achilles Dunk, OTR/L, CLT  , 09/05/2019, 3:23 PM  [place] MAIN [facility] SERVICES [address] [place], [place], [address] Phone: [phone]   Fax:  [phone]  Name: JOACHIM CARTON MRN: [id number] Date of Birth: 1945/05/13

## 2019-09-09 ENCOUNTER — Ambulatory Visit: Payer: Medicare Other | Admitting: Physical Therapy

## 2019-09-09 ENCOUNTER — Ambulatory Visit: Payer: Medicare Other | Admitting: Occupational Therapy

## 2019-09-11 ENCOUNTER — Ambulatory Visit: Payer: Medicare Other | Admitting: Physical Therapy

## 2019-09-16 ENCOUNTER — Encounter: Payer: Medicare Other | Admitting: Occupational Therapy

## 2019-09-16 ENCOUNTER — Ambulatory Visit: Payer: Medicare Other | Admitting: Physical Therapy

## 2019-09-18 ENCOUNTER — Encounter: Payer: Medicare Other | Admitting: Occupational Therapy

## 2019-09-18 ENCOUNTER — Ambulatory Visit: Payer: Medicare Other | Admitting: Physical Therapy

## 2019-09-23 ENCOUNTER — Ambulatory Visit: Payer: Medicare Other | Admitting: Physical Therapy

## 2019-09-23 ENCOUNTER — Encounter: Payer: Medicare Other | Admitting: Occupational Therapy

## 2019-09-25 ENCOUNTER — Ambulatory Visit: Payer: Medicare Other | Admitting: Physical Therapy

## 2019-09-25 ENCOUNTER — Encounter: Payer: Medicare Other | Admitting: Occupational Therapy

## 2019-09-30 ENCOUNTER — Encounter: Payer: Medicare Other | Admitting: Occupational Therapy

## 2019-09-30 ENCOUNTER — Ambulatory Visit: Payer: Medicare Other | Admitting: Physical Therapy

## 2019-10-02 ENCOUNTER — Ambulatory Visit: Payer: Medicare Other | Admitting: Physical Therapy

## 2019-10-02 ENCOUNTER — Encounter: Payer: Medicare Other | Admitting: Occupational Therapy

## 2019-10-07 ENCOUNTER — Encounter: Payer: Medicare Other | Admitting: Occupational Therapy

## 2019-10-07 ENCOUNTER — Ambulatory Visit: Payer: Medicare Other | Admitting: Physical Therapy

## 2019-10-09 ENCOUNTER — Ambulatory Visit: Payer: Medicare Other | Admitting: Physical Therapy

## 2019-10-09 ENCOUNTER — Encounter: Payer: Medicare Other | Admitting: Occupational Therapy

## 2021-01-06 DIAGNOSIS — Z8616 Personal history of COVID-19: Secondary | ICD-10-CM

## 2021-01-06 HISTORY — DX: Personal history of COVID-19: Z86.16

## 2021-05-17 ENCOUNTER — Other Ambulatory Visit: Payer: Self-pay | Admitting: Family Medicine

## 2021-05-17 DIAGNOSIS — R053 Chronic cough: Secondary | ICD-10-CM

## 2021-05-18 ENCOUNTER — Other Ambulatory Visit: Payer: Self-pay

## 2021-05-18 ENCOUNTER — Ambulatory Visit
Admission: RE | Admit: 2021-05-18 | Discharge: 2021-05-18 | Disposition: A | Discharge status: Home / Self Care | Payer: Medicare Other | Source: Ambulatory Visit | Attending: Family Medicine | Admitting: Family Medicine

## 2021-05-18 DIAGNOSIS — R053 Chronic cough: Secondary | ICD-10-CM | POA: Insufficient documentation

## 2021-05-18 IMAGING — CT CT CHEST W/O CM
2 of 4 series · 14 of 36 positions shown, 17 images · non-contrast
Comparison: None.

CLINICAL DATA: Cough and shortness of breath



[Series 2: chest 2.00 · axial · 0.76mm/px · z∈[-1208,-928]mm · 11 of 166 slices shown, 14 images]
[im 13/166  mediastinal]
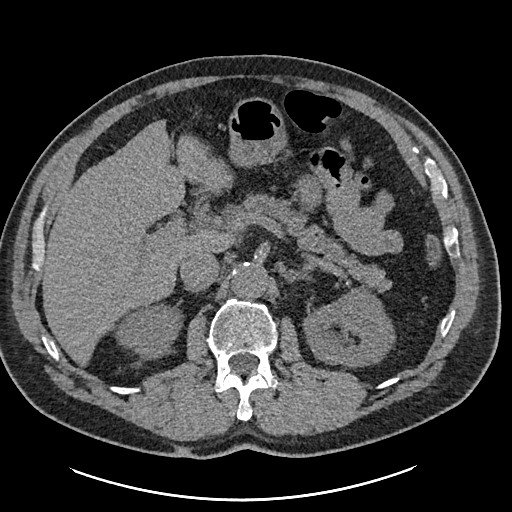
[im 13/166  lung]
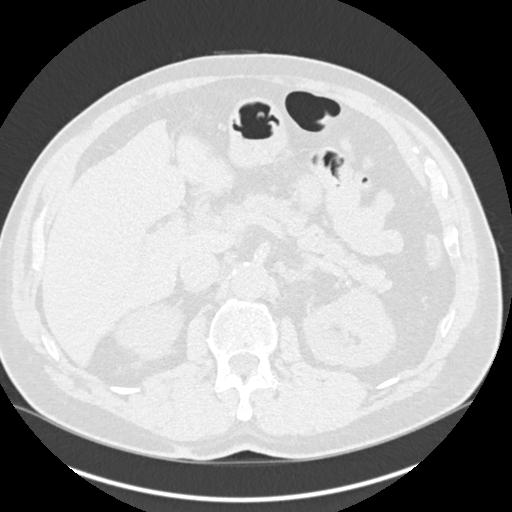
[im 26/166  lung]
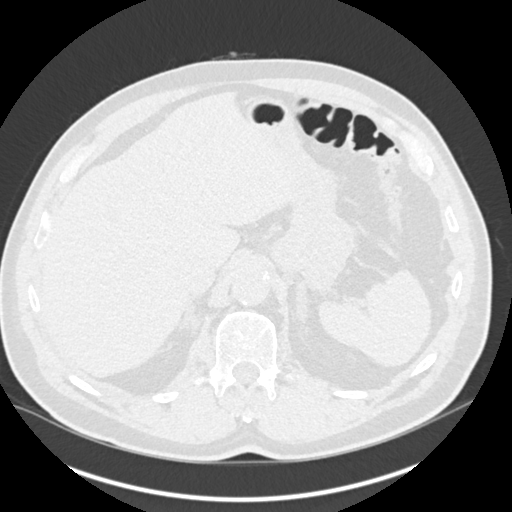
[im 39/166  lung]
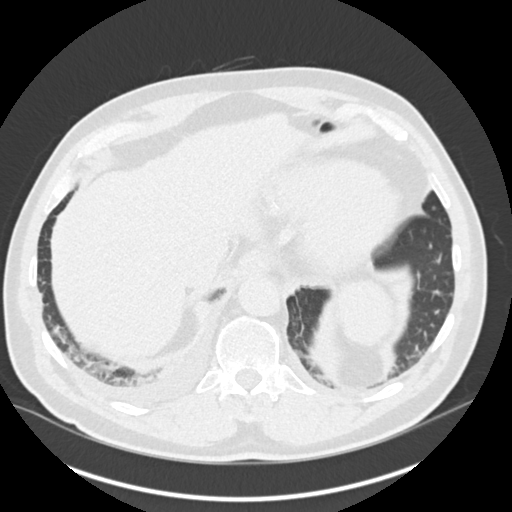
[im 51/166  lung]
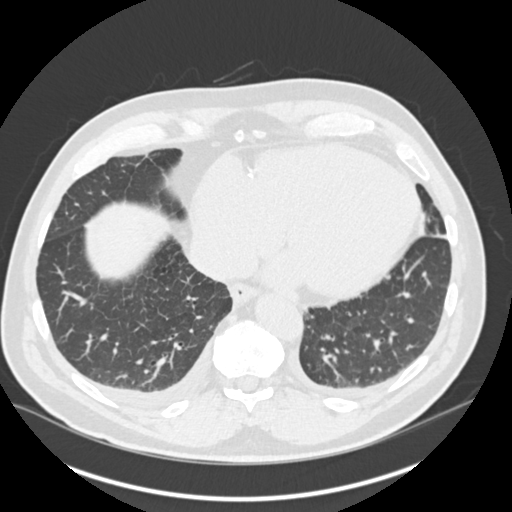
[im 64/166  mediastinal]
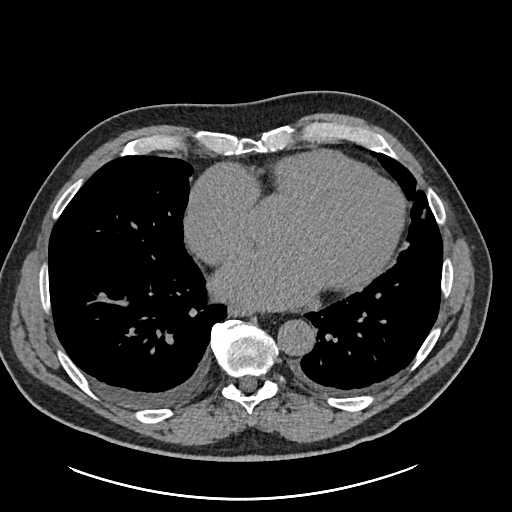
[im 64/166  lung]
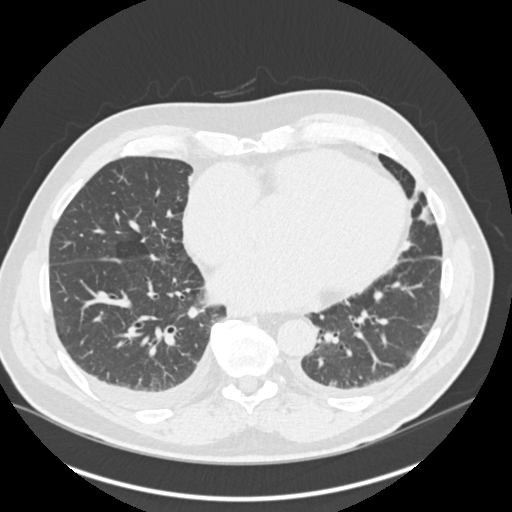
[im 89/166  lung]
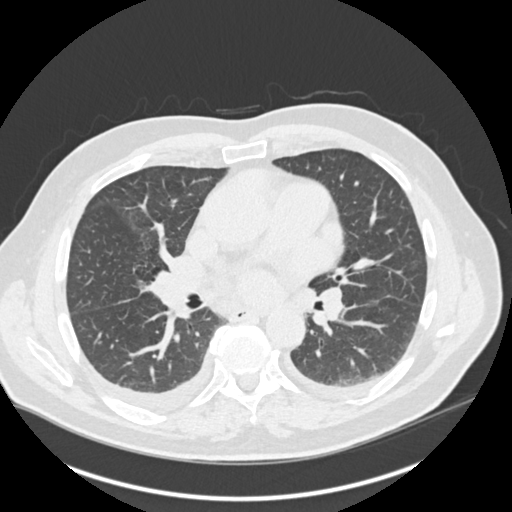
[im 102/166  lung]
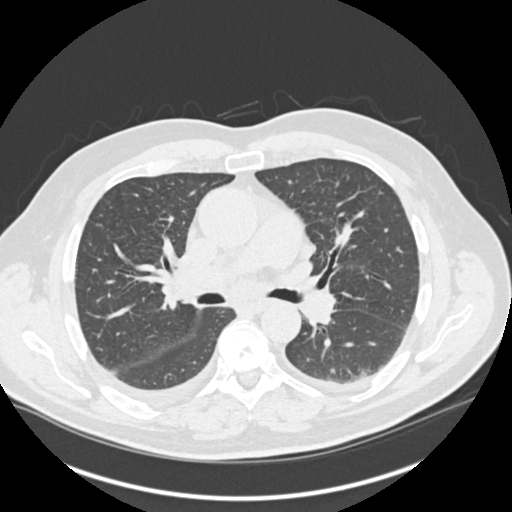
[im 115/166  lung]
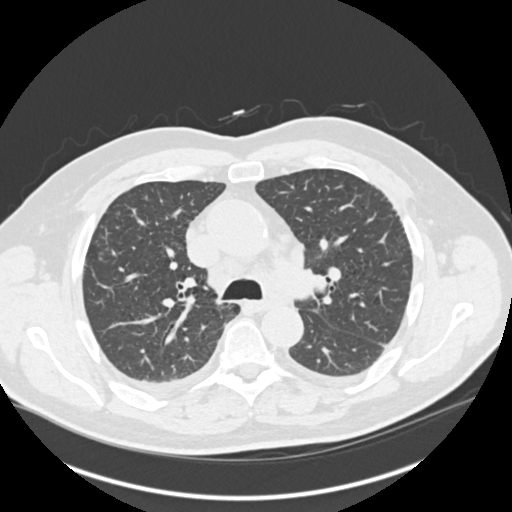
[im 127/166  mediastinal]
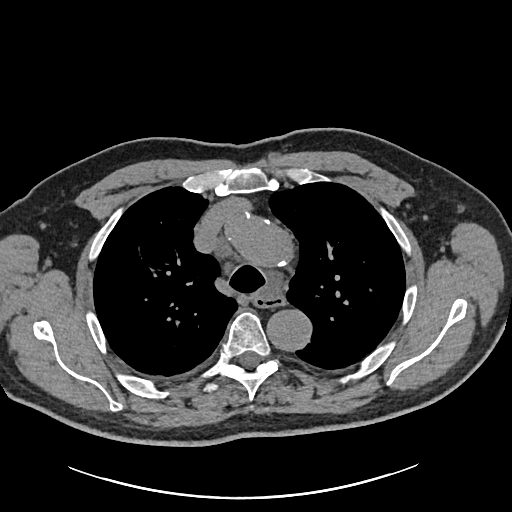
[im 127/166  lung]
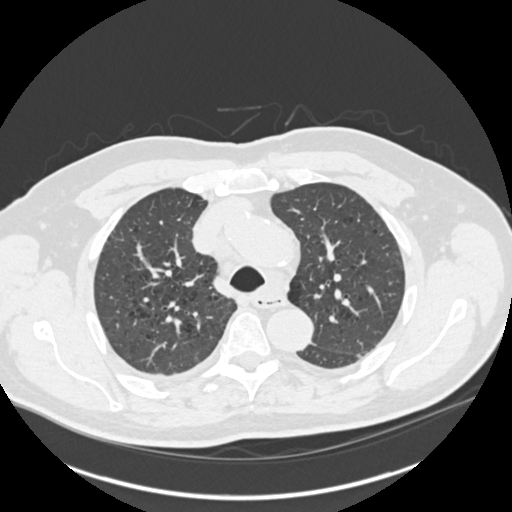
[im 140/166  lung]
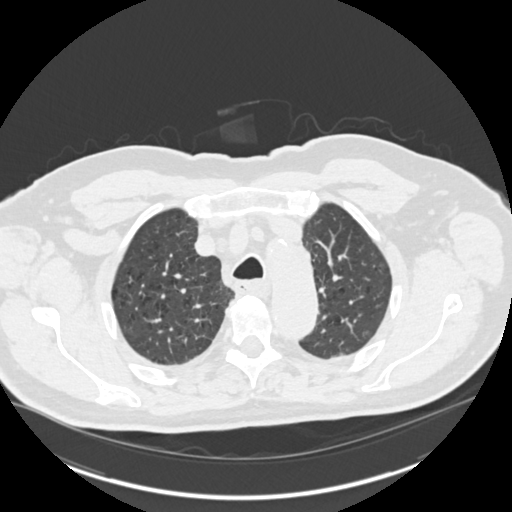
[im 153/166  lung]
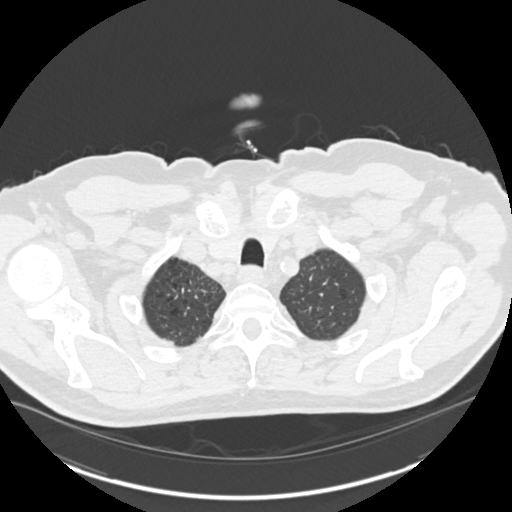

[Series 5: coronals chest 2.00 cor · coronal · 0.65mm/px · 3 of 180 slices shown]
[im 36/180  lung]
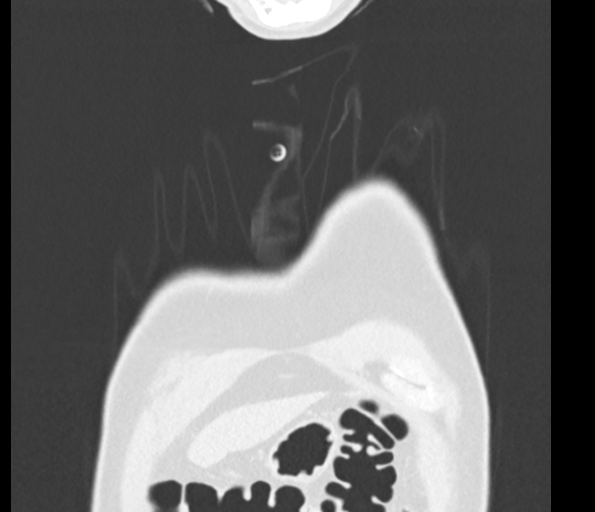
[im 72/180  lung]
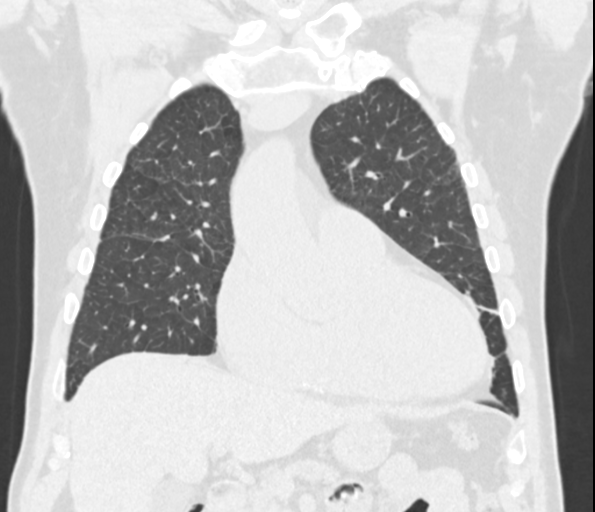
[im 108/180  lung]
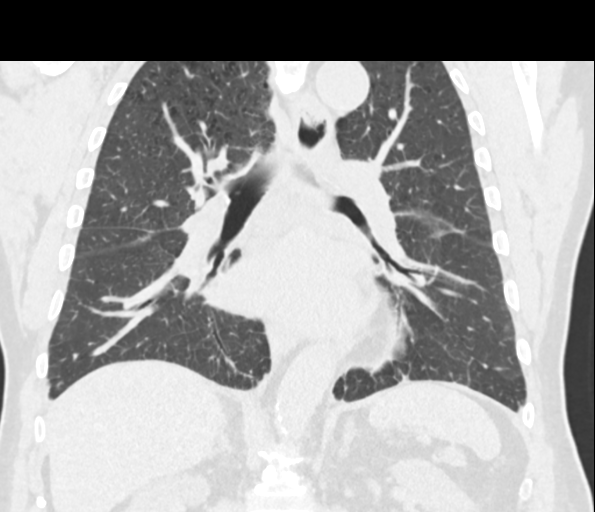

[14 of 36 positions shown; findings below may reference images not displayed]

FINDINGS: Cardiovascular: Cardiomegaly. No pericardial effusion. Three-vessel
coronary artery calcifications. Atherosclerotic disease of the
thoracic aorta. Dilated ascending thoracic aorta measuring up to
cm.

Mediastinum/Nodes: Esophagus and thyroid are unremarkable. Enlarged
mediastinal and hilar lymph nodes. Reference subcarinal lymph node
measuring 1.5 cm in short axis on series 2, image 68. Reference
right lower paratracheal lymph node measuring 1.4 cm in short axis
on series 2, image 37.

Lungs/Pleura: Central airways are patent. Mild centrilobular
emphysema. Small right-greater-than-left pleural effusions with
adjacent atelectasis. Mild diffuse ground-glass opacities and smooth
interlobular septal thickening of the lower lungs. No consolidation
or pneumothorax. Small pulmonary nodule of the right upper lobe
measuring 3 mm on series 3, image 73.

Upper Abdomen: No acute abnormality.

Musculoskeletal: No chest wall mass or suspicious bone lesions
identified.
IMPRESSION: 1. Mild diffuse ground-glass opacities, smooth interlobular septal
thickening of the lower lungs and small bilateral pleural effusions,
likely due to pulmonary edema.
2. Cardiomegaly.
3. Enlarged mediastinal and hilar lymph nodes, likely reactive given
presence of cardiac disease.
4. Three-vessel coronary artery calcifications.
5. Dilated ascending thoracic aorta measuring up to 4.5 cm.
Ascending thoracic aortic aneurysm. Recommend semi-annual imaging
followup by CTA or MRA and referral to cardiothoracic surgery if not
already obtained. This recommendation follows [HY]
ACCF/AHA/AATS/ACR/ASA/SCA/RONLOR/RONLOR/RONLOR/RONLOR Guidelines for the
Diagnosis and Management of Patients With Thoracic Aortic Disease.
Circulation. [HY]; 121: [id number]. Aortic aneurysm NOS ([HY]-[HY])
6. Small solid pulmonary nodule of the right upper lobe measuring 3
mm. No follow-up needed if patient is low-risk.This recommendation
follows the consensus statement: Guidelines for Management of
Incidental Pulmonary Nodules Detected on CT Images: From the
7. Aortic aneurysm NOS ([HY]-[HY]); Aortic Atherosclerosis
([HY]-[HY]) and Emphysema ([HY]-[HY]).

## 2022-02-16 ENCOUNTER — Other Ambulatory Visit (INDEPENDENT_AMBULATORY_CARE_PROVIDER_SITE_OTHER): Payer: Self-pay | Admitting: Nurse Practitioner

## 2022-02-16 DIAGNOSIS — I739 Peripheral vascular disease, unspecified: Secondary | ICD-10-CM

## 2022-02-26 NOTE — Progress Notes (Signed)
MRN : [id number]  Samuel Boyd is a 77 y.o. (11/16/1945) male who presents with chief complaint of check circulation.  History of Present Illness:   The patient presents to the office for evaluation of an abdominal aortic aneurysm. The aneurysm was found incidentally by CT scan. Patient denies abdominal pain or unusual back pain, no other abdominal complaints.  No history of an abrupt onset of a painful toe associated with blue discoloration.     No family history of AAA.    The patient is also evaluated for painful lower extremities and diminished pulses. Patient notes the pain is always associated with activity and is very consistent day today. Typically, the pain occurs at less than one block, progress is as activity continues to the point that the patient must stop walking. Resting including standing still for several minutes allows the patient to walk a similar distance before being forced to stop again. Uneven terrain and inclines shorten the distance. The pain has been progressive over the past several years. The patient denies any abrupt changes in claudication symptoms.    The patient denies rest pain or dangling of an extremity off the side of the bed during the night for relief. No open wounds or sores at this time. No prior interventions or surgeries.  No history of back problems or DJD of the lumbar sacral spine.   The patient denies amaurosis fugax or recent TIA symptoms. There are no recent neurological changes noted. The patient denies history of DVT, PE or superficial thrombophlebitis.  The patient's blood pressure has been stable and relatively well controlled. The patient denies recent episodes of angina or shortness of breath.   Duplex ultrasound dated 08/25/2021 shows an AAA that measures 4.7 cm  No outpatient medications have been marked as taking for the 02/27/22 encounter (Appointment) with Delana Meyer, Dolores Lory, MD.    Past Medical History:   Diagnosis Date   Arthritis    Dysrhythmia    History of kidney stones    Hypertension    Pneumonia     Past Surgical History:  Procedure Laterality Date   CYSTOSCOPY/URETEROSCOPY/HOLMIUM LASER/STENT PLACEMENT Left 09/16/2018   Procedure: CYSTOSCOPY/URETEROSCOPY/HOLMIUM LASER/STENT PLACEMENT, LEFT;  Surgeon: Hollice Espy, MD;  Location: [facility] ORS;  Service: Urology;  Laterality: Left;   INGUINAL HERNIA REPAIR Left 09/16/2018   Procedure: HERNIA REPAIR INGUINAL ADULT;  Surgeon: Herbert Pun, MD;  Location: [facility] ORS;  Service: General;  Laterality: Left;    Social History Social History   Tobacco Use   Smoking status: Some Days    Packs/day: 0.50    Types: Cigarettes   Smokeless tobacco: Never  Substance Use Topics   Alcohol use: Never   Drug use: Never    Family History No family history on file.  No Known Allergies   REVIEW OF SYSTEMS (Negative unless checked)  Constitutional: '[]'$ Weight loss  '[]'$ Fever  '[]'$ Chills Cardiac: '[]'$ Chest pain   '[]'$ Chest pressure   '[]'$ Palpitations   '[]'$ Shortness of breath when laying flat   '[]'$ Shortness of breath with exertion. Vascular:  '[x]'$ Pain in legs with walking   '[]'$ Pain in legs at rest  '[]'$ History of DVT   '[]'$ Phlebitis   '[]'$ Swelling in legs   '[]'$ Varicose veins   '[]'$ Non-healing ulcers Pulmonary:   '[]'$ Uses home oxygen   '[]'$ Productive cough   '[]'$ Hemoptysis   '[]'$ Wheeze  '[]'$ COPD   '[]'$ Asthma Neurologic:  '[]'$ Dizziness   '[]'$ Seizures   '[]'$ History of stroke   '[]'$ History of TIA  '[]'$ Aphasia   '[]'$ Vissual changes   '[]'$   Weakness or numbness in arm   '[]'$ Weakness or numbness in leg Musculoskeletal:   '[]'$ Joint swelling   '[]'$ Joint pain   '[]'$ Low back pain Hematologic:  '[]'$ Easy bruising  '[]'$ Easy bleeding   '[]'$ Hypercoagulable state   '[]'$ Anemic Gastrointestinal:  '[]'$ Diarrhea   '[]'$ Vomiting  '[]'$ Gastroesophageal reflux/heartburn   '[]'$ Difficulty swallowing. Genitourinary:  '[]'$ Chronic kidney disease   '[]'$ Difficult urination  '[]'$ Frequent urination   '[]'$ Blood in urine Skin:  '[]'$ Rashes   '[]'$ Ulcers   Psychological:  '[]'$ History of anxiety   '[]'$  History of major depression.  Physical Examination  There were no vitals filed for this visit. There is no height or weight on file to calculate BMI. Gen: WD/WN, NAD Head: [place]/AT, No temporalis wasting.  Ear/Nose/Throat: Hearing grossly intact, nares w/o erythema or drainage Eyes: PER, EOMI, sclera nonicteric.  Neck: Supple, no masses.  No bruit or JVD.  Pulmonary:  Good air movement, no audible wheezing, no use of accessory muscles.  Cardiac: RRR, normal S1, S2, no Murmurs. Vascular:  mild trophic changes, no open wounds Vessel Right Left  Radial Palpable Palpable  PT Not Palpable Not Palpable  DP Not Palpable Not Palpable  Gastrointestinal: soft, non-distended. No guarding/no peritoneal signs.  Musculoskeletal: M/S 5/5 throughout.  No visible deformity.  Neurologic: CN 2-12 intact. Pain and light touch intact in extremities.  Symmetrical.  Speech is fluent. Motor exam as listed above. Psychiatric: Judgment intact, Mood & affect appropriate for pt's clinical situation. Dermatologic: No rashes or ulcers noted.  No changes consistent with cellulitis.   CBC Lab Results  Component Value Date   WBC 6.4 07/08/2019   HGB 16.3 07/08/2019   HCT 47.5 07/08/2019   MCV 98.3 07/08/2019   PLT 151 07/08/2019    BMET    Component Value Date/Time   NA 139 07/08/2019 1058   K 4.1 07/08/2019 1058   CL 105 07/08/2019 1058   CO2 26 07/08/2019 1058   GLUCOSE 124 (H) 07/08/2019 1058   BUN 17 07/08/2019 1058   CREATININE 1.24 07/08/2019 1058   CALCIUM 8.8 (L) 07/08/2019 1058   GFRNONAA 58 (L) 07/08/2019 1058   GFRAA >60 07/08/2019 1058   CrCl cannot be calculated (Patient's most recent lab result is older than the maximum 21 days allowed.).  COAG Lab Results  Component Value Date   INR 1.2 07/08/2019    Radiology No results found.   Assessment/Plan 1. Infrarenal abdominal aortic aneurysm (AAA) without rupture (HCC) Recommend: No  surgery or intervention is indicated at this time.  The patient has an asymptomatic abdominal aortic aneurysm that is greater than 4 cm but less than 5 cm in maximal diameter.    I have reviewed the natural history of abdominal aortic aneurysm and the small risk of rupture for aneurysm less than 5 cm in size.  However, as these small aneurysms tend to enlarge over time, continued surveillance with ultrasound or CT scan is mandatory.   I have also discussed optimizing medical management with hypertension and lipid control and the importance of abstinence from tobacco.  The patient is also encouraged to exercise a minimum of 30 minutes 4 times a week.   Should the patient develop new onset abdominal or back pain or signs of peripheral embolization they are instructed to seek medical attention immediately and to alert the physician providing care that they have an aneurysm.   The patient voices their understanding.  I have scheduled the patient to return in 4 months with an aortic duplex. - VAS US AORTA/IVC/ILIACS; Future  2. Atherosclerosis of native artery of both lower extremities with intermittent claudication (HCC)  Recommend:  The patient has evidence of atherosclerosis of the lower extremities with claudication.  The patient does not voice lifestyle limiting changes at this point in time.  Noninvasive studies do not suggest clinically significant change.  No invasive studies, angiography or surgery at this time The patient should continue walking and begin a more formal exercise program.  The patient should continue antiplatelet therapy and aggressive treatment of the lipid abnormalities  No changes in the patient's medications at this time  Continued surveillance is indicated as atherosclerosis is likely to progress with time.    The patient will continue follow up with noninvasive studies as ordered.   3. Benign essential hypertension Continue antihypertensive medications as  already ordered, these medications have been reviewed and there are no changes at this time.  4. Paroxysmal A-fib (HCC) Continue antiarrhythmia medications as already ordered, these medications have been reviewed and there are no changes at this time.  Continue anticoagulation as ordered by Cardiology Service  5. Hyperlipemia, mixed Continue statin as ordered and reviewed, no changes at this time    Hortencia Pilar, MD  02/26/2022 3:58 PM

## 2022-02-27 ENCOUNTER — Ambulatory Visit (INDEPENDENT_AMBULATORY_CARE_PROVIDER_SITE_OTHER): Payer: Self-pay

## 2022-02-27 ENCOUNTER — Ambulatory Visit (INDEPENDENT_AMBULATORY_CARE_PROVIDER_SITE_OTHER): Payer: Medicare Other | Admitting: Vascular Surgery

## 2022-02-27 ENCOUNTER — Encounter (INDEPENDENT_AMBULATORY_CARE_PROVIDER_SITE_OTHER): Payer: Self-pay | Admitting: Vascular Surgery

## 2022-02-27 VITALS — BP 105/79 | HR 73 | Ht 69.0 in | Wt 191.0 lb

## 2022-02-27 DIAGNOSIS — E782 Mixed hyperlipidemia: Secondary | ICD-10-CM

## 2022-02-27 DIAGNOSIS — I10 Essential (primary) hypertension: Secondary | ICD-10-CM | POA: Diagnosis not present

## 2022-02-27 DIAGNOSIS — I7143 Infrarenal abdominal aortic aneurysm, without rupture: Secondary | ICD-10-CM

## 2022-02-27 DIAGNOSIS — I70213 Atherosclerosis of native arteries of extremities with intermittent claudication, bilateral legs: Secondary | ICD-10-CM

## 2022-02-27 DIAGNOSIS — I480 Paroxysmal atrial fibrillation: Secondary | ICD-10-CM | POA: Diagnosis not present

## 2022-02-27 DIAGNOSIS — I739 Peripheral vascular disease, unspecified: Secondary | ICD-10-CM

## 2022-03-05 ENCOUNTER — Encounter (INDEPENDENT_AMBULATORY_CARE_PROVIDER_SITE_OTHER): Payer: Self-pay | Admitting: Vascular Surgery

## 2022-03-05 DIAGNOSIS — I70219 Atherosclerosis of native arteries of extremities with intermittent claudication, unspecified extremity: Secondary | ICD-10-CM | POA: Insufficient documentation

## 2022-03-05 HISTORY — DX: Atherosclerosis of native arteries of extremities with intermittent claudication, unspecified extremity: I70.219

## 2022-05-12 ENCOUNTER — Encounter (INDEPENDENT_AMBULATORY_CARE_PROVIDER_SITE_OTHER): Payer: Self-pay | Admitting: Nurse Practitioner

## 2022-05-12 ENCOUNTER — Ambulatory Visit (INDEPENDENT_AMBULATORY_CARE_PROVIDER_SITE_OTHER): Payer: Medicare Other | Admitting: Nurse Practitioner

## 2022-05-12 ENCOUNTER — Ambulatory Visit (INDEPENDENT_AMBULATORY_CARE_PROVIDER_SITE_OTHER): Payer: Medicare Other

## 2022-05-12 VITALS — BP 132/88 | HR 93 | Resp 18 | Ht 68.0 in | Wt 192.0 lb

## 2022-05-12 DIAGNOSIS — E782 Mixed hyperlipidemia: Secondary | ICD-10-CM

## 2022-05-12 DIAGNOSIS — F17200 Nicotine dependence, unspecified, uncomplicated: Secondary | ICD-10-CM

## 2022-05-12 DIAGNOSIS — I7143 Infrarenal abdominal aortic aneurysm, without rupture: Secondary | ICD-10-CM

## 2022-05-12 DIAGNOSIS — I10 Essential (primary) hypertension: Secondary | ICD-10-CM

## 2022-06-05 ENCOUNTER — Encounter (INDEPENDENT_AMBULATORY_CARE_PROVIDER_SITE_OTHER): Payer: Self-pay | Admitting: Nurse Practitioner

## 2022-06-05 NOTE — Progress Notes (Signed)
Subjective:    Patient ID: Samuel Boyd, male    DOB: [birth year], 77 y.o.   MRN: [id number] Chief Complaint  Patient presents with   Follow-up    f/u with aorta-iliac duplex -    The patient returns to the office for surveillance of a known abdominal aortic aneurysm.  The patient was last seen in 2020 for his abdominal aortic aneurysm and it measured 3.4 cm at that time.  The patient also had a recent CT scan which shows a 4.5 cm ascending thoracic aortic aneurysm.  Patient denies abdominal pain or back pain, no other abdominal complaints. No changes suggesting embolic episodes.   There have been no interval changes in the patient's overall health care since his last visit.  Patient denies amaurosis fugax or TIA symptoms. There is no history of claudication or rest pain symptoms of the lower extremities. The patient denies angina or shortness of breath.   Duplex US of the aorta and iliac arteries shows an AAA measured 4.83 cm.    Review of Systems  Gastrointestinal:  Negative for abdominal pain.  All other systems reviewed and are negative.      Objective:   Physical Exam Vitals reviewed.  HENT:     Head: Normocephalic.  Cardiovascular:     Rate and Rhythm: Normal rate.     Pulses: Normal pulses.  Pulmonary:     Effort: Pulmonary effort is normal.  Skin:    General: Skin is warm and dry.  Neurological:     Mental Status: He is alert and oriented to person, place, and time.  Psychiatric:        Mood and Affect: Mood normal.        Behavior: Behavior normal.        Thought Content: Thought content normal.        Judgment: Judgment normal.     BP 132/88 (BP Location: Left Arm)   Pulse 93   Resp 18   Ht 5\' 8"  (1.727 m)   Wt 192 lb (87.1 kg)   BMI 29.19 kg/m   Past Medical History:  Diagnosis Date   Arthritis    Dysrhythmia    History of kidney stones    Hypertension    Pneumonia     Social History   Socioeconomic History   Marital status:  Married    Spouse name: Not on file   Number of children: Not on file   Years of education: Not on file   Highest education level: Not on file  Occupational History   Not on file  Tobacco Use   Smoking status: Some Days    Packs/day: .5    Types: Cigarettes   Smokeless tobacco: Never  Vaping Use   Vaping Use: Not on file  Substance and Sexual Activity   Alcohol use: Never   Drug use: Never   Sexual activity: Not on file  Other Topics Concern   Not on file  Social History Narrative   Not on file   Social Determinants of Health   Financial Resource Strain: Not on file  Food Insecurity: Not on file  Transportation Needs: Not on file  Physical Activity: Not on file  Stress: Not on file  Social Connections: Not on file  Intimate Partner Violence: Not on file    Past Surgical History:  Procedure Laterality Date   CYSTOSCOPY/URETEROSCOPY/HOLMIUM LASER/STENT PLACEMENT Left 09/16/2018   Procedure: CYSTOSCOPY/URETEROSCOPY/HOLMIUM LASER/STENT PLACEMENT, LEFT;  Surgeon: Vanna Scotland, MD;  Location: [facility]  ORS;  Service: Urology;  Laterality: Left;   INGUINAL HERNIA REPAIR Left 09/16/2018   Procedure: HERNIA REPAIR INGUINAL ADULT;  Surgeon: Carolan Shiver, MD;  Location: [facility] ORS;  Service: General;  Laterality: Left;    History reviewed. No pertinent family history.  No Known Allergies     Latest Ref Rng & Units 07/08/2019   10:58 AM  CBC  WBC 4.0 - 10.5 K/uL 6.4   Hemoglobin 13.0 - 17.0 g/dL 56.2   Hematocrit 13.0 - 52.0 % 47.5   Platelets 150 - 400 K/uL 151       CMP     Component Value Date/Time   NA 139 07/08/2019 1058   K 4.1 07/08/2019 1058   CL 105 07/08/2019 1058   CO2 26 07/08/2019 1058   GLUCOSE 124 (H) 07/08/2019 1058   BUN 17 07/08/2019 1058   CREATININE 1.24 07/08/2019 1058   CALCIUM 8.8 (L) 07/08/2019 1058   PROT 7.3 07/08/2019 1058   ALBUMIN 3.9 07/08/2019 1058   AST 20 07/08/2019 1058   ALT 13 07/08/2019 1058   ALKPHOS 51 07/08/2019  1058   BILITOT 0.7 07/08/2019 1058   GFRNONAA 58 (L) 07/08/2019 1058   GFRAA >60 07/08/2019 1058     No results found.     Assessment & Plan:   1. Infrarenal abdominal aortic aneurysm (AAA) without rupture (HCC) Recommend: No surgery or intervention is indicated at this time.  The patient has an asymptomatic abdominal aortic aneurysm that is greater than 4 cm but less than 5 cm in maximal diameter.     I have reviewed the natural history of abdominal aortic aneurysm and the small risk of rupture for aneurysm less than 5 cm in size.  However, as these small aneurysms tend to enlarge over time, continued surveillance with ultrasound or CT scan is mandatory.   I have also discussed optimizing medical management with hypertension and lipid control and the importance of abstinence from tobacco.  The patient is also encouraged to exercise a minimum of 30 minutes 4 times a week.   Should the patient develop new onset abdominal or back pain or signs of peripheral embolization they are instructed to seek medical attention immediately and to alert the physician providing care that they have an aneurysm.   The patient voices their understanding.  I have scheduled the patient to return in 6 months with an aortic duplex.  2. Benign essential hypertension Continue antihypertensive medications as already ordered, these medications have been reviewed and there are no changes at this time.  3. Tobacco dependence Smoking cessation was discussed, 3-10 minutes spent on this topic specifically  4. Hyperlipemia, mixed Continue statin as ordered and reviewed, no changes at this time   Current Outpatient Medications on File Prior to Visit  Medication Sig Dispense Refill   apixaban (ELIQUIS) 5 MG TABS tablet Take 5 mg by mouth 2 (two) times daily.     B Complex-C (SUPER B COMPLEX PO) Take by mouth.     Cholecalciferol (VITAMIN D-3 PO) Take by mouth daily at 6 (six) AM.     empagliflozin  (JARDIANCE) 10 MG TABS tablet Take 1 tablet by mouth daily.     furosemide (LASIX) 20 MG tablet TAKE 1 TABLET BY MOUTH EVERY DAY, TAKE 2ND DOSE IN 6 HOURS ONLY AS NEEDED     lisinopril (ZESTRIL) 5 MG tablet Take 5 mg by mouth every evening.     loratadine (CLARITIN) 10 MG tablet Take 10 mg by mouth  daily.     metoprolol tartrate (LOPRESSOR) 25 MG tablet Take 25 mg by mouth 2 (two) times daily.     Omega-3 Fatty Acids (FISH OIL) 1200 MG CAPS Take 1,200 mg by mouth every evening.     rosuvastatin (CRESTOR) 10 MG tablet Take 10 mg by mouth daily with supper.     FARXIGA 10 MG TABS tablet Take 10 mg by mouth daily. (Patient not taking: Reported on 05/12/2022)     No current facility-administered medications on file prior to visit.    There are no Patient Instructions on file for this visit. No follow-ups on file.   Georgiana Spinner, NP

## 2022-09-24 ENCOUNTER — Emergency Department: Payer: Medicare Other

## 2022-09-24 ENCOUNTER — Observation Stay: Payer: Medicare Other

## 2022-09-24 ENCOUNTER — Other Ambulatory Visit: Payer: Self-pay

## 2022-09-24 ENCOUNTER — Observation Stay
Admission: EM | Admit: 2022-09-24 | Discharge: 2022-09-25 | Disposition: A | Discharge status: Home / Self Care | Payer: Medicare Other | Attending: Internal Medicine | Admitting: Internal Medicine

## 2022-09-24 DIAGNOSIS — D751 Secondary polycythemia: Secondary | ICD-10-CM

## 2022-09-24 DIAGNOSIS — F17210 Nicotine dependence, cigarettes, uncomplicated: Secondary | ICD-10-CM | POA: Diagnosis not present

## 2022-09-24 DIAGNOSIS — I130 Hypertensive heart and chronic kidney disease with heart failure and stage 1 through stage 4 chronic kidney disease, or unspecified chronic kidney disease: Secondary | ICD-10-CM | POA: Insufficient documentation

## 2022-09-24 DIAGNOSIS — R7303 Prediabetes: Secondary | ICD-10-CM | POA: Insufficient documentation

## 2022-09-24 DIAGNOSIS — Z716 Tobacco abuse counseling: Secondary | ICD-10-CM

## 2022-09-24 DIAGNOSIS — R29818 Other symptoms and signs involving the nervous system: Secondary | ICD-10-CM | POA: Diagnosis not present

## 2022-09-24 DIAGNOSIS — I482 Chronic atrial fibrillation, unspecified: Secondary | ICD-10-CM | POA: Insufficient documentation

## 2022-09-24 DIAGNOSIS — I639 Cerebral infarction, unspecified: Secondary | ICD-10-CM | POA: Diagnosis not present

## 2022-09-24 DIAGNOSIS — N183 Chronic kidney disease, stage 3 unspecified: Secondary | ICD-10-CM | POA: Diagnosis not present

## 2022-09-24 DIAGNOSIS — F17200 Nicotine dependence, unspecified, uncomplicated: Secondary | ICD-10-CM | POA: Diagnosis present

## 2022-09-24 DIAGNOSIS — I5022 Chronic systolic (congestive) heart failure: Secondary | ICD-10-CM | POA: Diagnosis not present

## 2022-09-24 DIAGNOSIS — I10 Essential (primary) hypertension: Secondary | ICD-10-CM | POA: Diagnosis not present

## 2022-09-24 DIAGNOSIS — I480 Paroxysmal atrial fibrillation: Secondary | ICD-10-CM | POA: Diagnosis present

## 2022-09-24 DIAGNOSIS — Z8673 Personal history of transient ischemic attack (TIA), and cerebral infarction without residual deficits: Secondary | ICD-10-CM | POA: Diagnosis not present

## 2022-09-24 DIAGNOSIS — R531 Weakness: Principal | ICD-10-CM

## 2022-09-24 DIAGNOSIS — I5020 Unspecified systolic (congestive) heart failure: Secondary | ICD-10-CM | POA: Diagnosis not present

## 2022-09-24 DIAGNOSIS — R4781 Slurred speech: Secondary | ICD-10-CM | POA: Insufficient documentation

## 2022-09-24 DIAGNOSIS — I714 Abdominal aortic aneurysm, without rupture, unspecified: Secondary | ICD-10-CM | POA: Insufficient documentation

## 2022-09-24 DIAGNOSIS — Z7901 Long term (current) use of anticoagulants: Secondary | ICD-10-CM | POA: Diagnosis not present

## 2022-09-24 HISTORY — DX: Cerebral infarction, unspecified: I63.9

## 2022-09-24 HISTORY — DX: Secondary polycythemia: D75.1

## 2022-09-24 LAB — COMPREHENSIVE METABOLIC PANEL
ALT: 11 U/L (ref 0–44)
AST: 19 U/L (ref 15–41)
Albumin: 4 g/dL (ref 3.5–5.0)
Alkaline Phosphatase: 57 U/L (ref 38–126)
Anion gap: 10 (ref 5–15)
BUN: 22 mg/dL (ref 8–23)
CO2: 22 mmol/L (ref 22–32)
Calcium: 8.8 mg/dL — ABNORMAL LOW (ref 8.9–10.3)
Chloride: 107 mmol/L (ref 98–111)
Creatinine, Ser: 1.04 mg/dL (ref 0.61–1.24)
GFR, Estimated: >60 mL/min (ref >60)
Glucose, Bld: 117 mg/dL — ABNORMAL HIGH (ref 70–99)
Potassium: 4 mmol/L (ref 3.5–5.1)
Sodium: 139 mmol/L (ref 135–145)
Total Bilirubin: 1 mg/dL (ref 0.3–1.2)
Total Protein: 7.9 g/dL (ref 6.5–8.1)

## 2022-09-24 LAB — CBC
HCT: 55.4 % — ABNORMAL HIGH (ref 39.0–52.0)
Hemoglobin: 18.4 g/dL — ABNORMAL HIGH (ref 13.0–17.0)
MCH: 33.5 pg (ref 26.0–34.0)
MCHC: 33.2 g/dL (ref 30.0–36.0)
MCV: 100.7 fL — ABNORMAL HIGH (ref 80.0–100.0)
Platelets: 142 10*3/uL — ABNORMAL LOW (ref 150–400)
RBC: 5.5 MIL/uL (ref 4.22–5.81)
RDW: 13.2 % (ref 11.5–15.5)
WBC: 7 10*3/uL (ref 4.0–10.5)
nRBC: 0 % (ref 0.0–0.2)

## 2022-09-24 LAB — DIFFERENTIAL
Abs Immature Granulocytes: 0.02 10*3/uL (ref 0.00–0.07)
Basophils Absolute: 0 10*3/uL (ref 0.0–0.1)
Basophils Relative: 0 %
Eosinophils Absolute: 0.2 10*3/uL (ref 0.0–0.5)
Eosinophils Relative: 3 %
Immature Granulocytes: 0 %
Lymphocytes Relative: 31 %
Lymphs Abs: 2.2 10*3/uL (ref 0.7–4.0)
Monocytes Absolute: 0.7 10*3/uL (ref 0.1–1.0)
Monocytes Relative: 9 %
Neutro Abs: 3.9 10*3/uL (ref 1.7–7.7)
Neutrophils Relative %: 57 %

## 2022-09-24 LAB — I-STAT CREATININE, ED: Creatinine, Ser: 1.3 mg/dL — ABNORMAL HIGH (ref 0.61–1.24)

## 2022-09-24 LAB — CBG MONITORING, ED: Glucose-Capillary: 114 mg/dL — ABNORMAL HIGH (ref 70–99)

## 2022-09-24 LAB — PROTIME-INR
INR: 1.1 (ref 0.8–1.2)
Prothrombin Time: 14.8 seconds (ref 11.4–15.2)

## 2022-09-24 LAB — ETHANOL: Alcohol, Ethyl (B): <10 mg/dL (ref <10)

## 2022-09-24 LAB — APTT: aPTT: 30 seconds (ref 24–36)

## 2022-09-24 LAB — GLUCOSE, CAPILLARY: Glucose-Capillary: 174 mg/dL — ABNORMAL HIGH (ref 70–99)

## 2022-09-24 LAB — HEMOGLOBIN A1C
Hgb A1c MFr Bld: 6.2 % — ABNORMAL HIGH (ref 4.8–5.6)
Mean Plasma Glucose: 131.24 mg/dL

## 2022-09-24 MED ORDER — ASPIRIN 81 MG PO CHEW
324.0000 mg | CHEWABLE_TABLET | Freq: Every day | ORAL | Status: DC
  Administered 2022-09-25: 324 mg via ORAL
  Filled 2022-09-24: qty 4

## 2022-09-24 MED ORDER — SODIUM CHLORIDE 0.9% FLUSH: 3.0000 mL | Freq: Once | INTRAVENOUS | Status: DC

## 2022-09-24 MED ORDER — IOHEXOL 350 MG/ML SOLN
100.0000 mL | Freq: Once | INTRAVENOUS | Status: AC | PRN
  Administered 2022-09-24: 100 mL via INTRAVENOUS

## 2022-09-24 MED ORDER — ASPIRIN 81 MG PO CHEW: 324.0000 mg | CHEWABLE_TABLET | Freq: Every day | ORAL | Status: DC

## 2022-09-24 MED ORDER — LABETALOL HCL 5 MG/ML IV SOLN: 10.0000 mg | INTRAVENOUS | Status: DC | PRN

## 2022-09-24 MED ORDER — STROKE: EARLY STAGES OF RECOVERY BOOK: Freq: Once | Status: AC

## 2022-09-24 MED ORDER — NICOTINE 21 MG/24HR TD PT24: 21.0000 mg | MEDICATED_PATCH | Freq: Every day | TRANSDERMAL | Status: DC

## 2022-09-24 MED ORDER — ASPIRIN 81 MG PO CHEW
324.0000 mg | CHEWABLE_TABLET | Freq: Once | ORAL | Status: AC
  Administered 2022-09-24: 324 mg via ORAL
  Filled 2022-09-24: qty 4

## 2022-09-24 NOTE — Consult Note (Signed)
Code stroke activated at 1044. Pt in CT at this time.   Paged Dr Amada Jupiter at 1046. He was with pt in CT performing NIH exam at 1049.   LKW Friday afternoon. mRS 1.   Off camera at 1059.  Derrek Monaco, Telestroke RN

## 2022-09-24 NOTE — ED Provider Notes (Signed)
[facility] Provider Note    Event Date/Time   First MD Initiated Contact with Patient 09/24/22 1041     (approximate)   History   dysarthria and L side weakness   HPI  Samuel Boyd is a 77 y.o. male   with a history of abdominal aortic aneurysm, hypertension, hyperlipidemia.  Reviewed clinic note from April 5 by vascular surgery.  Has a known history of stage III chronic kidney disease, chronic atrial fibrillation on Eliquis and a history of a previous stroke with residual deficits from May 2021  Patient and his wife as well as his family are here and they report that he has a history of mild weakness on his left side.  However he noted around 4:30 PM yesterday that he suddenly felt more "paralyzed" in his left arm and his speech has been thick and new left-sided facial droop.  He has a history of weakness over the left side but much more prominent symptoms today especially as significant weakening of his left arm strength and changes in speech.  This occurred at 4:30 PM yesterday.  He did not wish to come to the ER at that time because he had things to complete including weed eating.  Patient denies any headache no fevers no chills.  Reports some numbness over the left side but thinks that is mostly chronic.  The weakness in the left side of the face as well as the significantly increased weakness of his left arm is new.  Last known well 4:30 PM yesterday  He has taken his medication including Eliquis this morning        Physical Exam   Triage Vital Signs: ED Triage Vitals  Encounter Vitals Group     BP 09/24/22 1025 (!) 145/100     Systolic BP Percentile --      Diastolic BP Percentile --      Pulse Rate 09/24/22 1025 98     Resp 09/24/22 1025 16     Temp 09/24/22 1025 97.9 F (36.6 C)     Temp Source 09/24/22 1025 Oral     SpO2 09/24/22 1025 97 %     Weight 09/24/22 1028 190 lb (86.2 kg)     Height 09/24/22 1028 5\' 9"  (1.753 m)     Head  Circumference --      Peak Flow --      Pain Score 09/24/22 1027 0     Pain Loc --      Pain Education --      Exclude from Growth Chart --     Most recent vital signs: Vitals:   09/24/22 1232 09/24/22 1334  BP: (!) 151/110 (!) 145/117  Pulse: 80 83  Resp: 19 16  Temp: 98 F (36.7 C) (!) 97.5 F (36.4 C)  SpO2: 98% 96%     General: Awake, no distress.  Resting pleasantly family at bedside.  Slight weakness of left face, speech is slight Lee thickened but still able to create discrete words and there is no obvious aphasia though slight dysarthria.  He has notable weakness in the left lower extremity positive for pronator drift and decreased sensation over the left arm more densely than the left leg.  Reports normal sensation for him, but reports perhaps some slight decrease in sensation from his previous stroke, hard to tell if there is new sensory change. NIHSS = 4 by my exam including slight sensory decrease, slight dysarthria, drift in the left arm  left leg. CV:  Good peripheral perfusion.  Normal tones Resp:  Normal effort.  Clear bilateral with normal work of breathing Abd:  No distention.  Soft nontender denies abdominal pain Other:     ED Results / Procedures / Treatments   Labs (all labs ordered are listed, but only abnormal results are displayed) Labs Reviewed  CBC - Abnormal; Notable for the following components:      Result Value   Hemoglobin 18.4 (*)    HCT 55.4 (*)    MCV 100.7 (*)    Platelets 142 (*)    All other components within normal limits  COMPREHENSIVE METABOLIC PANEL - Abnormal; Notable for the following components:   Glucose, Bld 117 (*)    Calcium 8.8 (*)    All other components within normal limits  CBG MONITORING, ED - Abnormal; Notable for the following components:   Glucose-Capillary 114 (*)    All other components within normal limits  I-STAT CREATININE, ED - Abnormal; Notable for the following components:   Creatinine, Ser 1.30 (*)     All other components within normal limits  PROTIME-INR  APTT  DIFFERENTIAL  ETHANOL  HEMOGLOBIN A1C  CBG MONITORING, ED     EKG  A inter by me 1120 heart rate 90 QRS 90 QTc 460 Atrial fibrillation no evidence of acute ischemia.   RADIOLOGY  CT of the head interpreted by me for acute gross intracranial hemorrhage.  I do not see intracranial hemorrhage.  However await read by radiology.  There are areas of concern on the right suggestive of possible old stroke or ischemia.  Again await final read by radiologist   CT ANGIO HEAD NECK W WO CM W PERF (CODE STROKE)  Result Date: 09/24/2022 CLINICAL DATA:  Neuro deficit, acute, stroke suspected. Left-sided weakness and balance issues. Slurred speech and facial asymmetry. EXAM: CT ANGIOGRAPHY HEAD AND NECK CT PERFUSION BRAIN TECHNIQUE: Multidetector CT imaging of the head and neck was performed using the standard protocol during bolus administration of intravenous contrast. Multiplanar CT image reconstructions and MIPs were obtained to evaluate the vascular anatomy. Carotid stenosis measurements (when applicable) are obtained utilizing NASCET criteria, using the distal internal carotid diameter as the denominator. Multiphase CT imaging of the brain was performed following IV bolus contrast injection. Subsequent parametric perfusion maps were calculated using RAPID software. RADIATION DOSE REDUCTION: This exam was performed according to the departmental dose-optimization program which includes automated exposure control, adjustment of the mA and/or kV according to patient size and/or use of iterative reconstruction technique. CONTRAST:  OMNIPAQUE IOHEXOL 350 MG/ML SOLN COMPARISON:  Head CT 09/24/2022. MRI brain 07/08/2019. MRA head/neck 07/25/2019. FINDINGS: CTA NECK FINDINGS Aortic arch: Three-vessel arch configuration. Atherosclerotic calcifications of the aortic arch and arch vessel origins. Arch vessel origins are patent. Right carotid system:  No evidence of dissection, stenosis (50% or greater) or occlusion. Mixed plaque along the right carotid bulb and proximal right cervical ICA with calcified plaque along the distal right cervical ICA. Left carotid system: No evidence of dissection, stenosis (50% or greater) or occlusion. Predominantly calcified plaque along the left carotid bulb and proximal left cervical ICA. 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 Skeleton: Mild cervical spondylosis without high-grade spinal canal stenosis. Other neck: Unremarkable. Upper chest: Unremarkable. Review of the MIP images confirms the above findings CTA HEAD FINDINGS Anterior circulation: Calcified plaque along the carotid siphons without hemodynamically  significant stenosis. Unchanged severe, multifocal stenosis of the right ACA A2/A3 segments. The left ACA and bilateral MCAs are patent proximally without stenosis or aneurysm. Distal branches are symmetric. 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 The SCAs, AICAs and PICAs are patent proximally. The PCAs are patent proximally without stenosis or aneurysm. Distal branches are symmetric. Venous sinuses: Early phase of contrast. Anatomic variants: Persistent fetal origin of the bilateral PCAs with hypoplastic P1 segments. Review of the MIP images confirms the above findings CT Brain Perfusion Findings: CBF (<30%) Volume: 0mL Perfusion (Tmax>6.0s) volume: 10mL Mismatch Volume: 10mL Ischemia location:Brainstem and left MCA territory. IMPRESSION: 1. No acute large vessel occlusion. 2. 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 3. Unchanged severe, multifocal stenosis of the right ACA A2/A3 segments. 4. 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 5. No  hemodynamically significant stenosis of the carotid arteries. 6. Approximately 10 mL of ischemia in the brainstem and left MCA territory by CT perfusion. Aortic Atherosclerosis (ICD10-I70.0). Electronically Signed   By: Orvan Falconer M.D.   On: 09/24/2022 11:38   CT HEAD CODE STROKE WO CONTRAST  Result Date: 09/24/2022 CLINICAL DATA:  Code stroke.  Left arm weakness EXAM: CT HEAD WITHOUT CONTRAST TECHNIQUE: Contiguous axial images were obtained from the base of the skull through the vertex without intravenous contrast. RADIATION DOSE REDUCTION: This exam was performed according to the departmental dose-optimization program which includes automated exposure control, adjustment of the mA and/or kV according to patient size and/or use of iterative reconstruction technique. COMPARISON:  07/08/2019 FINDINGS: Brain: Chronic small vessel ischemic gliosis with chronic lacunar infarcts in the bilateral thalamus. Small chronic right superior frontal, left parietal, and right cerebellar infarcts. No acute hemorrhage, hydrocephalus, or masslike finding Vascular: No hyperdense vessel or unexpected calcification. Skull: Normal. Negative for fracture or focal lesion. Sinuses/Orbits: No acute finding. Other: Presumed sent in epic chat ASPECTS [facility] Stroke Program Early CT Score) - Ganglionic level infarction (caudate, lentiform nuclei, internal capsule, insula, M1-M3 cortex): 7 - Supraganglionic infarction (M4-M6 cortex): 3 Total score (0-10 with 10 being normal): 10 IMPRESSION: 1. No acute finding. 2. Multifocal chronic infarcts affecting cortical and subcortical structures. Electronically Signed   By: Tiburcio Pea M.D.   On: 09/24/2022 10:54      PROCEDURES:  Critical Care performed: Yes, see critical care procedure note(s)  CRITICAL CARE Performed by: Sharyn Creamer   Total critical care time: 30 minutes  Critical care time was exclusive of separately billable procedures and treating other  patients.  Critical care was necessary to treat or prevent imminent or life-threatening deterioration.  Critical care was time spent personally by me on the following activities: development of treatment plan with patient and/or surrogate as well as nursing, discussions with consultants, evaluation of patient's response to treatment, examination of patient, obtaining history from patient or surrogate, ordering and performing treatments and interventions, ordering and review of laboratory studies, ordering and review of radiographic studies, pulse oximetry and re-evaluation of patient's condition.   Procedures   MEDICATIONS ORDERED IN ED: Medications  sodium chloride flush (NS) 0.9 % injection 3 mL (0 mLs Intravenous Hold 09/24/22 1058)   stroke: early stages of recovery book (has no administration in time range)  nicotine (NICODERM CQ - dosed in mg/24 hours) patch 21 mg (has no administration in time range)  labetalol (NORMODYNE) injection 10 mg (has no administration in time range)  iohexol (OMNIPAQUE) 350 MG/ML injection 100 mL (100 mLs Intravenous Contrast Given 09/24/22 1104)  aspirin chewable tablet 324 mg (324 mg Oral Given 09/24/22 1138)     IMPRESSION / MDM / ASSESSMENT AND PLAN / ED COURSE  I reviewed the triage vital signs and the nursing notes.                              Differential diagnosis includes, but is not limited to, stroke, recrudescence, metabolic insult, or other etiology.  He has no chest pain no pulmonary symptoms vital signs reassuring except for diastolic hypertension.  He is anticoagulated.  Frontal diagnosis is broad but ED care initially focused on evaluation for acute new stroke and potential large vessel occlusion  ----------------------------------------- 10:42 AM on 09/24/2022 ----------------------------------------- Patient is not a thrombolytic candidate he is actively anticoagulated and took his anticoagulant this morning.  However he is still  activators as part of an acute code stroke process given he has significant motor weakness that is new as of about 4:30 PM yesterday and new speech changes.  Although I suspect unlikely a large vessel occlusion, he is still within the neurointerventional treatment window and given he has associated motor and speech changes we will continue his acute code stroke process in the event LVO is present.  Awaiting CT to rule out intracranial hemorrhage as well as stat teleneurology consult which has been activated by triage nurse Barbee Cough.  Other considerations would be recrudescence type symptomatology especially given the presentation of symptoms and an area of previous stroke.  However he denies any acute symptomatology that would suggest underlying acute insult such as infection, change in baseline medical health etc.   Patient's presentation is most consistent with acute presentation with potential threat to life or bodily function.   The patient is on the cardiac monitor to evaluate for evidence of arrhythmia and/or significant heart rate changes.  Patient understanding agreeable with plan for admission.  Discussed case and patient admitted to the service of Dr. Alvester Morin      FINAL CLINICAL IMPRESSION(S) / ED DIAGNOSES   Final diagnoses:  Left-sided weakness     Rx / DC Orders   ED Discharge Orders     None        Note:  This document was prepared using Dragon voice recognition software and may include unintentional dictation errors.   Sharyn Creamer, MD 09/24/22 [phone]

## 2022-09-24 NOTE — Plan of Care (Signed)
Patient alert & oriented. Received this afternoon monitoring status post CVA occurrence. Family at bedside. Bed locked in lowest position call bell within reach. Nursing will continue to monitor.   Problem: Health Behavior/Discharge Planning: Goal: Ability to manage health-related needs will improve Outcome: Progressing   Problem: Clinical Measurements: Goal: Ability to maintain clinical measurements within normal limits will improve Outcome: Progressing Goal: Will remain free from infection Outcome: Progressing   Problem: Activity: Goal: Risk for activity intolerance will decrease Outcome: Progressing   Problem: Nutrition: Goal: Adequate nutrition will be maintained Outcome: Progressing   Problem: Safety: Goal: Ability to remain free from injury will improve Outcome: Progressing

## 2022-09-24 NOTE — Assessment & Plan Note (Signed)
Hemoglobin 18 today Appears near baseline Monitor

## 2022-09-24 NOTE — ED Notes (Signed)
Advised nurse that patient has ready bed 

## 2022-09-24 NOTE — Assessment & Plan Note (Signed)
2D echo June 2023 in the [facility] system with EF around 25% Appears fairly euvolemic Monitor volume status closely

## 2022-09-24 NOTE — Consult Note (Signed)
Neurology Consultation Reason for Consult: Left-sided weakness Referring Physician: Quale, M  CC: Sided weakness  History is obtained from: Patient  HPI: Samuel Boyd is a 77 y.o. male patient with a history of a previous left ACA territory stroke who presents with worsening of his left-sided weakness over the past 2 days.  His last known normal state of health on Friday afternoon when he started noticing some left-sided trouble while he was weed whacking.  He did not want to come to the emergency department but finally relented to his wife and daughter this morning and came in to get checked out.  They report that he started slurring his speech yesterday, and therefore has been worsening.   LKW: 4:30 PM on Friday tnk given?: no, outside of window  Past Medical History:  Diagnosis Date   Arthritis    Atrial fibrillation [facility])    Dysrhythmia    History of kidney stones    Hypertension    Pneumonia    Stroke [facility])    2021     History reviewed. No pertinent family history.   Social History:  reports that he has been smoking cigarettes. He has never used smokeless tobacco. He reports that he does not drink alcohol and does not use drugs.   Exam: Current vital signs: BP (!) 142/108   Pulse (!) 128   Temp 97.9 F (36.6 C) (Oral)   Resp 16   Ht 5\' 9"  (1.753 m)   Wt 86.2 kg   SpO2 95%   BMI 28.06 kg/m  Vital signs in last 24 hours: Temp:  [97.9 F (36.6 C)] 97.9 F (36.6 C) (08/18 1025) Pulse Rate:  [98-128] 128 (08/18 1058) Resp:  [16] 16 (08/18 1058) BP: (142-145)/(100-108) 142/108 (08/18 1058) SpO2:  [95 %-97 %] 95 % (08/18 1058) Weight:  [86.2 kg] 86.2 kg (08/18 1028)   Physical Exam  Appears well-developed and well-nourished.   Neuro: Mental Status: Patient is awake, alert, oriented to person, place, month, year, and situation. Patient is able to give a clear and coherent history. No signs of aphasia or neglect Cranial Nerves: II: Visual Fields are  full. Pupils are equal, round, and reactive to light.   III,IV, VI: EOMI without ptosis or diploplia.  V: Facial sensation is diminished on the left side VII: Facial movement with left-sided facial weakness Motor: He has 4-5 strength in left arm and 4/5 strength in the left leg with drift in both. Sensory: Sensation is diminished throughout the left side Cerebellar: FNF appears ataxic out of proportion to weakness in the left arm, in proportion to weakness in the left leg   I have reviewed labs in epic and the results pertinent to this consultation are: Creatinine 1.3  I have reviewed the images obtained: CT  Impression: 77 year old gentleman who presents with worsening left-sided deficits of previous stroke.  Possibilities include recrudescence of previous stroke symptoms, recurrence of stroke due to his right ACA stenosis, or new stroke another distribution.  He will need an MRI to assess for new stroke, and receive stroke workup if it is positive.  Recommendations: 1) CT A/P 2) MRI brain 3) admission for stroke workup if positive, I would hold Eliquis for the time being pending his MRI Stroke workup if positive:   Lipids, A1c, telemetry, echo   PT, OT, ST   Aspirin 325 mg daily  Ritta Slot, MD [facility] [phone]  If 7pm- 7am, please page neurology on call as listed in AMION.

## 2022-09-24 NOTE — H&P (Signed)
History and Physical    Patient: Samuel Boyd ZOX:[id number] DOB: 11/17/1945 DOA: 09/24/2022 DOS: the patient was seen and examined on 09/24/2022 PCP: Marisue Ivan, MD  Patient coming from: Home  Chief Complaint:  Chief Complaint  Patient presents with   dysarthria   L side weakness   HPI: Samuel Boyd is a 77 y.o. male with medical history significant of HFrEF, atrial fibrillation, tobacco abuse, hypertension, history of CVA presenting with CVA.  Patient reports left-sided weakness over the past 1 to 2 days.  Family also at the bedside reporting similar.  Symptoms have been present since patient was doing outdoor activities.  Is also had worsening dysarthria over the past day or so.  No chest pain or shortness of breath.  No nausea or vomiting.  Reported prior CVA back in 2021 involving the left ACA territory.  Still smoking up to 3/4 to 1 pack/day.  No reported alcohol use.  No chest pain or shortness of breath.  Symptoms have progressively worsened.  No abdominal pain or diarrhea. Presented to the ER afebrile, blood pressure 130s to 150s over 90s to 100s.  Satting well on room air.  White count 7, hemoglobin 18, platelets 142, creatinine 1.04.  CT head stable.  CT angio of the neck noted for unchanged occlusion of of right vertebral artery, unchanged severe stenosis of left vertebral artery at the V4 segment, unchanged severe multifocal stenosis of the right ACA A2/A3 segments.  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  Dr. Amada Jupiter with neurology consulted with recommendation for full CVA evaluation.  Anticoagulation being held in the interim. Review of Systems: As mentioned in the history of present illness. All other systems reviewed and are negative. Past Medical History:  Diagnosis Date   Arthritis    Atrial fibrillation [facility])    Dysrhythmia    History of kidney stones    Hypertension    Pneumonia    Stroke [facility])    2021   Past Surgical  History:  Procedure Laterality Date   CYSTOSCOPY/URETEROSCOPY/HOLMIUM LASER/STENT PLACEMENT Left 09/16/2018   Procedure: CYSTOSCOPY/URETEROSCOPY/HOLMIUM LASER/STENT PLACEMENT, LEFT;  Surgeon: Vanna Scotland, MD;  Location: [facility] ORS;  Service: Urology;  Laterality: Left;   INGUINAL HERNIA REPAIR Left 09/16/2018   Procedure: HERNIA REPAIR INGUINAL ADULT;  Surgeon: Carolan Shiver, MD;  Location: [facility] ORS;  Service: General;  Laterality: Left;   Social History:  reports that he has been smoking cigarettes. He has never used smokeless tobacco. He reports that he does not drink alcohol and does not use drugs.  No Known Allergies  History reviewed. No pertinent family history.  Prior to Admission medications   Medication Sig Start Date End Date Taking? Authorizing Provider  apixaban (ELIQUIS) 5 MG TABS tablet Take 5 mg by mouth 2 (two) times daily.   Yes [provider]  B Complex-C (SUPER B COMPLEX PO) Take by mouth.   Yes [provider]  Cholecalciferol (VITAMIN D-3 PO) Take by mouth daily at 6 (six) AM.   Yes [provider]  empagliflozin (JARDIANCE) 10 MG TABS tablet Take 1 tablet by mouth daily. 04/19/22 04/19/23 Yes [provider]  furosemide (LASIX) 20 MG tablet TAKE 1 TABLET BY MOUTH EVERY DAY, TAKE 2ND DOSE IN 6 HOURS ONLY AS NEEDED 04/18/22  Yes [provider]  lisinopril (ZESTRIL) 5 MG tablet Take 5 mg by mouth every evening.   Yes [provider]  loratadine (CLARITIN) 10 MG tablet Take 10 mg by mouth daily.  Yes [provider]  metoprolol tartrate (LOPRESSOR) 50 MG tablet Take 50 mg by mouth 2 (two) times daily.   Yes [provider]  Omega-3 Fatty Acids (FISH OIL) 1200 MG CAPS Take 1,200 mg by mouth every evening.   Yes [provider]  rosuvastatin (CRESTOR) 10 MG tablet Take 10 mg by mouth daily with supper.   Yes [provider]  FARXIGA 10 MG TABS tablet Take 10 mg by mouth  daily. Patient not taking: Reported on 05/12/2022    [provider]  metoprolol tartrate (LOPRESSOR) 25 MG tablet Take 25 mg by mouth 2 (two) times daily. Patient not taking: Reported on 09/24/2022    [provider]    Physical Exam: Vitals:   09/24/22 1118 09/24/22 1135 09/24/22 1232 09/24/22 1334  BP: (!) 151/104 (!) 135/99 (!) 151/110 (!) 145/117  Pulse: 87 89 80 83  Resp: 16 20 19 16   Temp:   98 F (36.7 C) (!) 97.5 F (36.4 C)  TempSrc:   Oral Oral  SpO2: 98% 100% 98% 96%  Weight:      Height:       Physical Exam Constitutional:      Appearance: He is normal weight.  HENT:     Head: Normocephalic and atraumatic.     Nose: Nose normal.     Mouth/Throat:     Mouth: Mucous membranes are moist.  Cardiovascular:     Rate and Rhythm: Normal rate and regular rhythm.  Pulmonary:     Effort: Pulmonary effort is normal.  Abdominal:     General: Bowel sounds are normal.  Musculoskeletal:     Comments: Positive left-sided upper and lower extremity weakness  Skin:    General: Skin is warm.  Neurological:     Comments: Positive left-sided deficits     Data Reviewed:  There are no new results to review at this time. CT ANGIO HEAD NECK W WO CM W PERF (CODE STROKE) CLINICAL DATA:  Neuro deficit, acute, stroke suspected. Left-sided weakness and balance issues. Slurred speech and facial asymmetry.  EXAM: CT ANGIOGRAPHY HEAD AND NECK  CT PERFUSION BRAIN  TECHNIQUE: Multidetector CT imaging of the head and neck was performed using the standard protocol during bolus administration of intravenous contrast. Multiplanar CT image reconstructions and MIPs were obtained to evaluate the vascular anatomy. Carotid stenosis measurements (when applicable) are obtained utilizing NASCET criteria, using the distal internal carotid diameter as the denominator.  Multiphase CT imaging of the brain was performed following IV bolus contrast injection. Subsequent  parametric perfusion maps were calculated using RAPID software.  RADIATION DOSE REDUCTION: This exam was performed according to the departmental dose-optimization program which includes automated exposure control, adjustment of the mA and/or kV according to patient size and/or use of iterative reconstruction technique.  CONTRAST:  OMNIPAQUE IOHEXOL 350 MG/ML SOLN  COMPARISON:  Head CT 09/24/2022. MRI brain 07/08/2019. MRA head/neck 07/25/2019.  FINDINGS: CTA NECK FINDINGS  Aortic arch: Three-vessel arch configuration. Atherosclerotic calcifications of the aortic arch and arch vessel origins. Arch vessel origins are patent.  Right carotid system: No evidence of dissection, stenosis (50% or greater) or occlusion. Mixed plaque along the right carotid bulb and proximal right cervical ICA with calcified plaque along the distal right cervical ICA.  Left carotid system: No evidence of dissection, stenosis (50% or greater) or occlusion. Predominantly calcified plaque along the left carotid bulb and proximal left cervical ICA.  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 Unchanged severe stenosis of the left vertebral artery V4 segment.  Skeleton: Mild cervical spondylosis without high-grade spinal canal stenosis.  Other neck: Unremarkable.  Upper chest: Unremarkable.  Review of the MIP images confirms the above findings  CTA HEAD FINDINGS  Anterior circulation: Calcified plaque along the carotid siphons without hemodynamically significant stenosis. Unchanged severe, multifocal stenosis of the right ACA A2/A3 segments. The left ACA and bilateral MCAs are patent proximally without stenosis or aneurysm. Distal branches are symmetric.  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 The SCAs, AICAs and PICAs are patent proximally. The PCAs are patent proximally without stenosis or  aneurysm. Distal branches are symmetric.  Venous sinuses: Early phase of contrast.  Anatomic variants: Persistent fetal origin of the bilateral PCAs with hypoplastic P1 segments.  Review of the MIP images confirms the above findings  CT Brain Perfusion Findings:  CBF (<30%) Volume: 0mL  Perfusion (Tmax>6.0s) volume: 10mL  Mismatch Volume: 10mL  Ischemia location:Brainstem and left MCA territory.  IMPRESSION: 1. No acute large vessel occlusion. 2. 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 3. Unchanged severe, multifocal stenosis of the right ACA A2/A3 segments. 4. 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 5. No hemodynamically significant stenosis of the carotid arteries. 6. Approximately 10 mL of ischemia in the brainstem and left MCA territory by CT perfusion.  Aortic Atherosclerosis (ICD10-I70.0).  Electronically Signed   By: Orvan Falconer M.D.   On: 09/24/2022 11:38 CT HEAD CODE STROKE WO CONTRAST CLINICAL DATA:  Code stroke.  Left arm weakness  EXAM: CT HEAD WITHOUT CONTRAST  TECHNIQUE: Contiguous axial images were obtained from the base of the skull through the vertex without intravenous contrast.  RADIATION DOSE REDUCTION: This exam was performed according to the departmental dose-optimization program which includes automated exposure control, adjustment of the mA and/or kV according to patient size and/or use of iterative reconstruction technique.  COMPARISON:  07/08/2019  FINDINGS: Brain: Chronic small vessel ischemic gliosis with chronic lacunar infarcts in the bilateral thalamus. Small chronic right superior frontal, left parietal, and right cerebellar infarcts. No acute hemorrhage, hydrocephalus, or masslike finding  Vascular: No hyperdense vessel or unexpected calcification.  Skull: Normal. Negative for fracture or focal  lesion.  Sinuses/Orbits: No acute finding.  Other: Presumed sent in epic chat  ASPECTS [facility] Stroke Program Early CT Score)  - Ganglionic level infarction (caudate, lentiform nuclei, internal capsule, insula, M1-M3 cortex): 7  - Supraganglionic infarction (M4-M6 cortex): 3  Total score (0-10 with 10 being normal): 10  IMPRESSION: 1. No acute finding. 2. Multifocal chronic infarcts affecting cortical and subcortical structures.  Electronically Signed   By: Tiburcio Pea M.D.   On: 09/24/2022 10:54  Lab Results  Component Value Date   WBC 7.0 09/24/2022   HGB 18.4 (H) 09/24/2022   HCT 55.4 (H) 09/24/2022   MCV 100.7 (H) 09/24/2022   PLT 142 (L) 09/24/2022   Last metabolic panel Lab Results  Component Value Date   GLUCOSE 117 (H) 09/24/2022   NA 139 09/24/2022   K 4.0 09/24/2022   CL 107 09/24/2022   CO2 22 09/24/2022   BUN 22 09/24/2022   CREATININE 1.30 (H) 09/24/2022   GFRNONAA >60 09/24/2022   CALCIUM 8.8 (L) 09/24/2022   PROT 7.9 09/24/2022   ALBUMIN 4.0 09/24/2022   BILITOT 1.0 09/24/2022   ALKPHOS 57 09/24/2022   AST 19  09/24/2022   ALT 11 09/24/2022   ANIONGAP 10 09/24/2022    Assessment and Plan: * CVA (cerebral vascular accident) (HCC) Worsening left-sided weakness with dysarthria over the past 1 to 2 days with noted prior history of CVA in the past CT head stable CTA of the head neck with noted chronic occlusion of the right vertebral artery near its origin. 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 Neurology formally consulted Recommend holding anticoagulation pending full evaluation Full dose aspirin Risk stratification labs MRI, 2D echo Otherwise follow-up neurology recommendations and imaging findings  Erythrocytosis Hemoglobin 18 today Appears near baseline Monitor  Tobacco  dependence 1 pack/day smoker Discussed cessation at length Patient not ready to quit  Paroxysmal A-fib (HCC) Rate controlled at present Hold Eliquis for now pending formal CVA evaluation Follow  Chronic systolic CHF (congestive heart failure), NYHA class 2 (HCC) 2D echo June 2023 in the [facility] system with EF around 25% Appears fairly euvolemic Monitor volume status closely   Borderline diabetes mellitus Monitor blood sugar Exercise clinically indicated  Benign essential hypertension Allow for permissive hypertension in the setting of CVA evaluation As needed IV labetalol for blood pressure greater than 220 or diastolic pressure greater than 110  Abdominal aortic aneurysm (AAA) without rupture (HCC) Baseline AAA with last measurement around 4.7 cm as of cardiology evaluation in the [facility] system in June 2023 No reported abdominal pain at present Monitor for now  Reassess as appropriate   Greater than 50% was spent in counseling and coordination of care with patient Total encounter time 80 minutes or more    Advance Care Planning:   Code Status: Full Code   Consults: Neurology   Family Communication: Family at the bedside   Severity of Illness: The appropriate patient status for this patient is OBSERVATION. Observation status is judged to be reasonable and necessary in order to provide the required intensity of service to ensure the patient's safety. The patient's presenting symptoms, physical exam findings, and initial radiographic and laboratory data in the context of their medical condition is felt to place them at decreased risk for further clinical deterioration. Furthermore, it is anticipated that the patient will be medically stable for discharge from the hospital within 2 midnights of admission.   Author: Floydene Flock, MD 09/24/2022 3:09 PM  For on call review [email or web address].

## 2022-09-24 NOTE — Assessment & Plan Note (Signed)
Allow for permissive hypertension in the setting of CVA evaluation As needed IV labetalol for blood pressure greater than 220 or diastolic pressure greater than 110

## 2022-09-24 NOTE — Assessment & Plan Note (Signed)
1 pack/day smoker Discussed cessation at length Patient not ready to quit

## 2022-09-24 NOTE — ED Triage Notes (Signed)
Pt to ED with family POV, was weed eating 2 days ago and began having L sided weakness and felt off balance which got worse last night. Pt began having slurred speech and facial asymmetry around 930pm last night, both worse since this morning.   Hx L sided CVA 2 years ago. Hx a fib, takes eliquis. Last dose this morning.   L arm drift is more than baseline but pt has residual L sided weakness since last stroke 2 years ago. Ataxic to LUE. Dr Fanny Bien evaluating pt in triage 2 to eval if code stroke.  BP 145/100, CBG 114.

## 2022-09-24 NOTE — ED Notes (Signed)
Pt is now code stroke per Quale.  LKW 1630 yesterday (speech).

## 2022-09-24 NOTE — ED Notes (Signed)
Son states patient began having difficulty walking (balance issues) and slurred speech on Friday.  Hx CVA in the past.

## 2022-09-24 NOTE — Assessment & Plan Note (Addendum)
Rate controlled at present Hold Eliquis for now pending formal CVA evaluation Follow

## 2022-09-24 NOTE — Assessment & Plan Note (Signed)
Monitor blood sugar Exercise clinically indicated

## 2022-09-24 NOTE — ED Notes (Signed)
ED TO INPATIENT HANDOFF REPORT  ED Nurse Name and Phone #:  Florentina Addison [phone]  S Name/Age/Gender Samuel Boyd 77 y.o. male Room/Bed: ED18HA/ED18HA  Code Status   Code Status: Not on file  Home/SNF/Other Home Patient oriented to: self, place, time, and situation Is this baseline? Yes   Triage Complete: Triage complete  Chief Complaint CVA (cerebral vascular accident) [facility]) [I63.9]  Triage Note Pt to ED with family POV, was weed eating 2 days ago and began having L sided weakness and felt off balance which got worse last night. Pt began having slurred speech and facial asymmetry around 930pm last night, both worse since this morning.   Hx L sided CVA 2 years ago. Hx a fib, takes eliquis. Last dose this morning.   L arm drift is more than baseline but pt has residual L sided weakness since last stroke 2 years ago. Ataxic to LUE. Dr Fanny Bien evaluating pt in triage 2 to eval if code stroke.  BP 145/100, CBG 114.   Allergies No Known Allergies  Level of Care/Admitting Diagnosis ED Disposition     ED Disposition  Admit   Condition  --   Comment  Hospital Area: [facility] [[id number]]  Level of Care: Telemetry Medical [104]  Covid Evaluation: Confirmed COVID Negative  Diagnosis: CVA (cerebral vascular accident) [facility]) [[id number]]  Admitting Physician: Steele Berg  Attending Physician: Floydene Flock [[id number]]          B Medical/Surgery History Past Medical History:  Diagnosis Date   Arthritis    Atrial fibrillation (HCC)    Dysrhythmia    History of kidney stones    Hypertension    Pneumonia    Stroke (HCC)    2021   Past Surgical History:  Procedure Laterality Date   CYSTOSCOPY/URETEROSCOPY/HOLMIUM LASER/STENT PLACEMENT Left 09/16/2018   Procedure: CYSTOSCOPY/URETEROSCOPY/HOLMIUM LASER/STENT PLACEMENT, LEFT;  Surgeon: Vanna Scotland, MD;  Location: [facility] ORS;  Service: Urology;  Laterality: Left;   INGUINAL HERNIA REPAIR Left  09/16/2018   Procedure: HERNIA REPAIR INGUINAL ADULT;  Surgeon: Carolan Shiver, MD;  Location: [facility] ORS;  Service: General;  Laterality: Left;     A IV Location/Drains/Wounds Patient Lines/Drains/Airways Status     Active Line/Drains/Airways     Name Placement date Placement time Site Days   Peripheral IV 09/24/22 18 G 1.16" Right Antecubital 09/24/22  1052  Antecubital  less than 1   Ureteral Drain/Stent Left ureter 6 Fr. 09/16/18  0803  Left ureter  1469   Incision (Closed) 09/16/18 Abdomen Left 09/16/18  0935  -- 1469            Intake/Output Last 24 hours No intake or output data in the 24 hours ending 09/24/22 1149  Labs/Imaging Results for orders placed or performed during the hospital encounter of 09/24/22 (from the past 48 hour(s))  CBG monitoring, ED     Status: Abnormal   Collection Time: 09/24/22 10:36 AM  Result Value Ref Range   Glucose-Capillary 114 (H) 70 - 99 mg/dL    Comment: Glucose reference range applies only to samples taken after fasting for at least 8 hours.  Protime-INR     Status: None   Collection Time: 09/24/22 10:41 AM  Result Value Ref Range   Prothrombin Time 14.8 11.4 - 15.2 seconds   INR 1.1 0.8 - 1.2    Comment: (NOTE) INR goal varies based on device and disease states. Performed at [facility], [address] [place], [place]  [address]   APTT     Status: None   Collection Time: 09/24/22 10:41 AM  Result Value Ref Range   aPTT 30 24 - 36 seconds    Comment: Performed at [facility], [address] [place], [place] [address]  CBC     Status: Abnormal   Collection Time: 09/24/22 10:41 AM  Result Value Ref Range   WBC 7.0 4.0 - 10.5 K/uL   RBC 5.50 4.22 - 5.81 MIL/uL   Hemoglobin 18.4 (H) 13.0 - 17.0 g/dL   HCT 09.8 (H) 11.9 - 14.7 %   MCV 100.7 (H) 80.0 - 100.0 fL   MCH 33.5 26.0 - 34.0 pg   MCHC 33.2 30.0 - 36.0 g/dL   RDW 82.9 56.2 - 13.0 %   Platelets 142 (L) 150 - 400 K/uL   nRBC 0.0 0.0 - 0.2 %     Comment: Performed at [facility], [address] [place], [place] [address]  Differential     Status: None   Collection Time: 09/24/22 10:41 AM  Result Value Ref Range   Neutrophils Relative % 57 %   Neutro Abs 3.9 1.7 - 7.7 K/uL   Lymphocytes Relative 31 %   Lymphs Abs 2.2 0.7 - 4.0 K/uL   Monocytes Relative 9 %   Monocytes Absolute 0.7 0.1 - 1.0 K/uL   Eosinophils Relative 3 %   Eosinophils Absolute 0.2 0.0 - 0.5 K/uL   Basophils Relative 0 %   Basophils Absolute 0.0 0.0 - 0.1 K/uL   Immature Granulocytes 0 %   Abs Immature Granulocytes 0.02 0.00 - 0.07 K/uL    Comment: Performed at [facility], [address] [place], [place] [address]  Comprehensive metabolic panel     Status: Abnormal   Collection Time: 09/24/22 10:41 AM  Result Value Ref Range   Sodium 139 135 - 145 mmol/L   Potassium 4.0 3.5 - 5.1 mmol/L   Chloride 107 98 - 111 mmol/L   CO2 22 22 - 32 mmol/L   Glucose, Bld 117 (H) 70 - 99 mg/dL    Comment: Glucose reference range applies only to samples taken after fasting for at least 8 hours.   BUN 22 8 - 23 mg/dL   Creatinine, Ser 9.52 0.61 - 1.24 mg/dL   Calcium 8.8 (L) 8.9 - 10.3 mg/dL   Total Protein 7.9 6.5 - 8.1 g/dL   Albumin 4.0 3.5 - 5.0 g/dL   AST 19 15 - 41 U/L   ALT 11 0 - 44 U/L   Alkaline Phosphatase 57 38 - 126 U/L   Total Bilirubin 1.0 0.3 - 1.2 mg/dL   GFR, Estimated >84 >13 mL/min    Comment: (NOTE) Calculated using the CKD-EPI Creatinine Equation (2021)    Anion gap 10 5 - 15    Comment: Performed at [facility], [address] [place], [place] [address]  I-stat Creatinine, ED     Status: Abnormal   Collection Time: 09/24/22 10:58 AM  Result Value Ref Range   Creatinine, Ser 1.30 (H) 0.61 - 1.24 mg/dL   CT ANGIO HEAD NECK W WO CM W PERF (CODE STROKE)  Result Date: 09/24/2022 CLINICAL DATA:  Neuro deficit, acute, stroke suspected. Left-sided weakness and balance issues. Slurred speech and facial  asymmetry. EXAM: CT ANGIOGRAPHY HEAD AND NECK CT PERFUSION BRAIN TECHNIQUE: Multidetector CT imaging of the head and neck was performed using the standard protocol during bolus administration of intravenous contrast. Multiplanar CT image  reconstructions and MIPs were obtained to evaluate the vascular anatomy. Carotid stenosis measurements (when applicable) are obtained utilizing NASCET criteria, using the distal internal carotid diameter as the denominator. Multiphase CT imaging of the brain was performed following IV bolus contrast injection. Subsequent parametric perfusion maps were calculated using RAPID software. RADIATION DOSE REDUCTION: This exam was performed according to the departmental dose-optimization program which includes automated exposure control, adjustment of the mA and/or kV according to patient size and/or use of iterative reconstruction technique. CONTRAST:  OMNIPAQUE IOHEXOL 350 MG/ML SOLN COMPARISON:  Head CT 09/24/2022. MRI brain 07/08/2019. MRA head/neck 07/25/2019. FINDINGS: CTA NECK FINDINGS Aortic arch: Three-vessel arch configuration. Atherosclerotic calcifications of the aortic arch and arch vessel origins. Arch vessel origins are patent. Right carotid system: No evidence of dissection, stenosis (50% or greater) or occlusion. Mixed plaque along the right carotid bulb and proximal right cervical ICA with calcified plaque along the distal right cervical ICA. Left carotid system: No evidence of dissection, stenosis (50% or greater) or occlusion. Predominantly calcified plaque along the left carotid bulb and proximal left cervical ICA. 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 Skeleton: Mild cervical spondylosis without high-grade spinal canal stenosis. Other neck: Unremarkable. Upper chest: Unremarkable. Review of the MIP images confirms the above findings CTA HEAD  FINDINGS Anterior circulation: Calcified plaque along the carotid siphons without hemodynamically significant stenosis. Unchanged severe, multifocal stenosis of the right ACA A2/A3 segments. The left ACA and bilateral MCAs are patent proximally without stenosis or aneurysm. Distal branches are symmetric. 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 The SCAs, AICAs and PICAs are patent proximally. The PCAs are patent proximally without stenosis or aneurysm. Distal branches are symmetric. Venous sinuses: Early phase of contrast. Anatomic variants: Persistent fetal origin of the bilateral PCAs with hypoplastic P1 segments. Review of the MIP images confirms the above findings CT Brain Perfusion Findings: CBF (<30%) Volume: 0mL Perfusion (Tmax>6.0s) volume: 10mL Mismatch Volume: 10mL Ischemia location:Brainstem and left MCA territory. IMPRESSION: 1. No acute large vessel occlusion. 2. 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 3. Unchanged severe, multifocal stenosis of the right ACA A2/A3 segments. 4. 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 5. No hemodynamically significant stenosis of the carotid arteries. 6. Approximately 10 mL of ischemia in the brainstem and left MCA territory by CT perfusion. Aortic Atherosclerosis (ICD10-I70.0). Electronically Signed   By: Orvan Falconer M.D.   On: 09/24/2022 11:38   CT HEAD CODE STROKE WO CONTRAST  Result Date: 09/24/2022 CLINICAL DATA:  Code stroke.  Left arm weakness EXAM: CT HEAD WITHOUT CONTRAST TECHNIQUE: Contiguous axial images were obtained from the base of the skull through the vertex without intravenous contrast. RADIATION DOSE REDUCTION: This exam was performed according to the departmental dose-optimization program which includes automated exposure control, adjustment of the mA and/or kV according to patient size  and/or use of iterative reconstruction technique. COMPARISON:  07/08/2019 FINDINGS: Brain: Chronic small vessel ischemic gliosis with chronic lacunar infarcts in the bilateral thalamus. Small chronic right superior frontal, left parietal, and right cerebellar infarcts. No acute hemorrhage, hydrocephalus, or masslike finding Vascular: No hyperdense vessel or unexpected calcification. Skull: Normal. Negative for fracture or focal lesion. Sinuses/Orbits: No acute finding. Other: Presumed sent in epic chat ASPECTS [facility] Stroke Program Early CT Score) - Ganglionic level infarction (  caudate, lentiform nuclei, internal capsule, insula, M1-M3 cortex): 7 - Supraganglionic infarction (M4-M6 cortex): 3 Total score (0-10 with 10 being normal): 10 IMPRESSION: 1. No acute finding. 2. Multifocal chronic infarcts affecting cortical and subcortical structures. Electronically Signed   By: Tiburcio Pea M.D.   On: 09/24/2022 10:54    Pending Labs Unresulted Labs (From admission, onward)     Start     Ordered   09/24/22 1100  Ethanol  Once,   R        09/24/22 1100            Vitals/Pain Today's Vitals   09/24/22 1028 09/24/22 1058 09/24/22 1118 09/24/22 1135  BP:  (!) 142/108 (!) 151/104 (!) 135/99  Pulse:  (!) 128 87 89  Resp:  16 16 20   Temp:      TempSrc:      SpO2:  95% 98% 100%  Weight: 190 lb (86.2 kg)     Height: 5\' 9"  (1.753 m)     PainSc:        Isolation Precautions No active isolations  Medications Medications  sodium chloride flush (NS) 0.9 % injection 3 mL (0 mLs Intravenous Hold 09/24/22 1058)  iohexol (OMNIPAQUE) 350 MG/ML injection 100 mL (100 mLs Intravenous Contrast Given 09/24/22 1104)  aspirin chewable tablet 324 mg (324 mg Oral Given 09/24/22 1138)    Mobility Walks with assistance today- usually independent at baseline     Focused Assessments Neuro Assessment Handoff:  Swallow screen pass? Yes  Cardiac Rhythm: Atrial fibrillation NIH Stroke Scale  Dizziness  Present: No Headache Present: No Interval: Initial Level of Consciousness (1a.)   : Alert, keenly responsive LOC Questions (1b. )   : Answers both questions correctly LOC Commands (1c. )   : Performs both tasks correctly Best Gaze (2. )  : Normal Visual (3. )  : No visual loss Facial Palsy (4. )    : Minor paralysis Motor Arm, Left (5a. )   : Drift Motor Arm, Right (5b. ) : No drift Motor Leg, Left (6a. )  : Drift Motor Leg, Right (6b. ) : No drift Limb Ataxia (7. ): Present in one limb Sensory (8. )  : Mild-to-moderate sensory loss, patient feels pinprick is less sharp or is dull on the affected side, or there is a loss of superficial pain with pinprick, but patient is aware of being touched Best Language (9. )  : No aphasia Dysarthria (10. ): Mild-to-moderate dysarthria, patient slurs at least some words and, at worst, can be understood with some difficulty Extinction/Inattention (11.)   : No Abnormality Complete NIHSS TOTAL: 6 Last date known well: 09/22/22 Last time known well: 1600 Neuro Assessment: Exceptions to WDL Neuro Checks:   Initial (LKW 1630 yesterday (speech, face). L weakness since 2 days with prior L weakness from last stroke 2 years ago) (09/24/22 1036)  Has TPA been given? No If patient is a Neuro Trauma and patient is going to OR before floor call report to 4N Charge nurse: [phone] or [phone]   R Recommendations: See Admitting Provider Note  Report given to:   Additional Notes:

## 2022-09-24 NOTE — Assessment & Plan Note (Signed)
Worsening left-sided weakness with dysarthria over the past 1 to 2 days with noted prior history of CVA in the past CT head stable CTA of the head neck with noted chronic occlusion of the right vertebral artery near its origin. 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

## 2022-09-24 NOTE — Assessment & Plan Note (Addendum)
Baseline AAA with last measurement around 4.7 cm as of cardiology evaluation in the [facility] system in June 2023 No reported abdominal pain at present Monitor for now  Reassess as appropriate

## 2022-09-25 ENCOUNTER — Observation Stay: Admit: 2022-09-25 | Payer: Medicare Other

## 2022-09-25 ENCOUNTER — Observation Stay (HOSPITAL_BASED_OUTPATIENT_CLINIC_OR_DEPARTMENT_OTHER)
Admit: 2022-09-25 | Discharge: 2022-09-25 | Disposition: A | Discharge status: Home / Self Care | Payer: Medicare Other | Attending: Family Medicine | Admitting: Family Medicine

## 2022-09-25 DIAGNOSIS — I5022 Chronic systolic (congestive) heart failure: Secondary | ICD-10-CM | POA: Diagnosis not present

## 2022-09-25 DIAGNOSIS — I6381 Other cerebral infarction due to occlusion or stenosis of small artery: Secondary | ICD-10-CM

## 2022-09-25 DIAGNOSIS — I480 Paroxysmal atrial fibrillation: Secondary | ICD-10-CM | POA: Diagnosis not present

## 2022-09-25 DIAGNOSIS — I639 Cerebral infarction, unspecified: Secondary | ICD-10-CM | POA: Diagnosis not present

## 2022-09-25 DIAGNOSIS — R29818 Other symptoms and signs involving the nervous system: Secondary | ICD-10-CM | POA: Diagnosis not present

## 2022-09-25 DIAGNOSIS — I6389 Other cerebral infarction: Secondary | ICD-10-CM | POA: Diagnosis not present

## 2022-09-25 LAB — BASIC METABOLIC PANEL
Anion gap: 8 (ref 5–15)
BUN: 24 mg/dL — ABNORMAL HIGH (ref 8–23)
CO2: 23 mmol/L (ref 22–32)
Calcium: 8.6 mg/dL — ABNORMAL LOW (ref 8.9–10.3)
Chloride: 108 mmol/L (ref 98–111)
Creatinine, Ser: 1.02 mg/dL (ref 0.61–1.24)
GFR, Estimated: >60 mL/min (ref >60)
Glucose, Bld: 104 mg/dL — ABNORMAL HIGH (ref 70–99)
Potassium: 3.5 mmol/L (ref 3.5–5.1)
Sodium: 139 mmol/L (ref 135–145)

## 2022-09-25 LAB — ECHOCARDIOGRAM COMPLETE
AV Mean grad: 2 mmHg
AV Peak grad: 3.4 mmHg
Ao pk vel: 0.92 m/s
Area-P 1/2: 5.23 cm2
Calc EF: 23.8 %
Height: 69 in
S' Lateral: 5.1 cm
Single Plane A2C EF: 12.6 %
Single Plane A4C EF: 28.9 %
Weight: 3040 [oz_av]

## 2022-09-25 LAB — LIPID PANEL
Cholesterol: 131 mg/dL (ref 0–200)
HDL: 38 mg/dL — ABNORMAL LOW (ref >40)
LDL Cholesterol: 60 mg/dL (ref 0–99)
Total CHOL/HDL Ratio: 3.4 ratio
Triglycerides: 165 mg/dL — ABNORMAL HIGH (ref <150)
VLDL: 33 mg/dL (ref 0–40)

## 2022-09-25 MED ORDER — ASPIRIN 81 MG PO TBEC: 81.0000 mg | DELAYED_RELEASE_TABLET | Freq: Every day | ORAL | 0 refills | Status: AC

## 2022-09-25 MED ORDER — NICOTINE 21 MG/24HR TD PT24: 21.0000 mg | MEDICATED_PATCH | Freq: Every day | TRANSDERMAL | 0 refills | Status: DC

## 2022-09-25 MED ORDER — ENOXAPARIN SODIUM 40 MG/0.4ML IJ SOSY
40.0000 mg | PREFILLED_SYRINGE | INTRAMUSCULAR | Status: DC
  Administered 2022-09-25: 40 mg via SUBCUTANEOUS
  Filled 2022-09-25: qty 0.4

## 2022-09-25 NOTE — Progress Notes (Signed)
PT Cancellation Note  Patient Details Name: JAKOLBY HOSEK MRN: [id number] DOB: 1945-10-05   Cancelled Treatment:     Pt was screened for skilled PT today. Pt and family reports being back to baseline with no need of PT at this time. Pt signed off from PT due to no skilled PT needed.   Aleesia Henney 09/25/2022, 9:32 AM

## 2022-09-25 NOTE — TOC CM/SW Note (Signed)
Transition of Care [facility]) - Inpatient Brief Assessment   Patient Details  Name: JASMOND BELZER MRN: [id number] Date of Birth: 09-Dec-1945  [facility] CM/SW Contact:    Allena Katz, LCSW Phone Number: 09/25/2022, 9:43 AM   Clinical Narrative:    Transition of Care Asessment: Insurance and Status: Insurance coverage has been reviewed Patient has primary care physician: Yes Home environment has been reviewed: [address] [place] [address] Prior level of function:: PT/OT evaluated. no PT/OT needs at this time. Prior/Current Home Services: No current home services Social Determinants of Health Reivew: SDOH reviewed no interventions necessary Readmission risk has been reviewed: Yes Transition of care needs: no transition of care needs at this time

## 2022-09-25 NOTE — Plan of Care (Signed)

## 2022-09-25 NOTE — Plan of Care (Signed)
Problem: Education: Goal: Knowledge of General Education information will improve Description: Including pain rating scale, medication(s)/side effects and non-pharmacologic comfort measures 09/25/2022 1610 by Kadian Barcellos, Quitman Livings, RN Outcome: Adequate for Discharge 09/25/2022 0810 by Beryle Lathe, RN Outcome: Progressing   Problem: Health Behavior/Discharge Planning: Goal: Ability to manage health-related needs will improve 09/25/2022 1610 by Mayukha Symmonds, Quitman Livings, RN Outcome: Adequate for Discharge 09/25/2022 0810 by Beryle Lathe, RN Outcome: Progressing   Problem: Clinical Measurements: Goal: Ability to maintain clinical measurements within normal limits will improve 09/25/2022 1610 by Julee Stoll, Quitman Livings, RN Outcome: Adequate for Discharge 09/25/2022 0810 by Beryle Lathe, RN Outcome: Progressing Goal: Will remain free from infection 09/25/2022 1610 by Renny Remer, Quitman Livings, RN Outcome: Adequate for Discharge 09/25/2022 0810 by Beryle Lathe, RN Outcome: Progressing Goal: Diagnostic test results will improve 09/25/2022 1610 by Edie Vallandingham, Quitman Livings, RN Outcome: Adequate for Discharge 09/25/2022 0810 by Beryle Lathe, RN Outcome: Progressing Goal: Respiratory complications will improve 09/25/2022 1610 by Phineas Mcenroe, Quitman Livings, RN Outcome: Adequate for Discharge 09/25/2022 0810 by Beryle Lathe, RN Outcome: Progressing Goal: Cardiovascular complication will be avoided 09/25/2022 1610 by Emrie Gayle, Quitman Livings, RN Outcome: Adequate for Discharge 09/25/2022 0810 by Beryle Lathe, RN Outcome: Progressing   Problem: Activity: Goal: Risk for activity intolerance will decrease 09/25/2022 1610 by Ainhoa Rallo, Quitman Livings, RN Outcome: Adequate for Discharge 09/25/2022 0810 by Beryle Lathe, RN Outcome: Progressing   Problem: Nutrition: Goal: Adequate nutrition will be maintained 09/25/2022 1610 by Danikah Budzik, Quitman Livings, RN Outcome: Adequate for Discharge 09/25/2022 0810 by  Beryle Lathe, RN Outcome: Progressing   Problem: Coping: Goal: Level of anxiety will decrease 09/25/2022 1610 by Celsey Asselin, Quitman Livings, RN Outcome: Adequate for Discharge 09/25/2022 0810 by Beryle Lathe, RN Outcome: Progressing   Problem: Elimination: Goal: Will not experience complications related to bowel motility 09/25/2022 1610 by Eldine Rencher, Quitman Livings, RN Outcome: Adequate for Discharge 09/25/2022 0810 by Beryle Lathe, RN Outcome: Progressing Goal: Will not experience complications related to urinary retention 09/25/2022 1610 by Aamya Orellana, Quitman Livings, RN Outcome: Adequate for Discharge 09/25/2022 0810 by Beryle Lathe, RN Outcome: Progressing   Problem: Pain Managment: Goal: General experience of comfort will improve 09/25/2022 1610 by Johnothan Bascomb, Quitman Livings, RN Outcome: Adequate for Discharge 09/25/2022 0810 by Beryle Lathe, RN Outcome: Progressing   Problem: Safety: Goal: Ability to remain free from injury will improve 09/25/2022 1610 by Xavier Fournier, Quitman Livings, RN Outcome: Adequate for Discharge 09/25/2022 0810 by Beryle Lathe, RN Outcome: Progressing   Problem: Skin Integrity: Goal: Risk for impaired skin integrity will decrease 09/25/2022 1610 by Travarus Trudo, Quitman Livings, RN Outcome: Adequate for Discharge 09/25/2022 0810 by Beryle Lathe, RN Outcome: Progressing   Problem: Education: Goal: Knowledge of disease or condition will improve 09/25/2022 1610 by Lorane Cousar, Quitman Livings, RN Outcome: Adequate for Discharge 09/25/2022 0810 by Beryle Lathe, RN Outcome: Progressing Goal: Knowledge of secondary prevention will improve (MUST DOCUMENT ALL) 09/25/2022 1610 by Beryle Lathe, RN Outcome: Adequate for Discharge 09/25/2022 0810 by Beryle Lathe, RN Outcome: Progressing Goal: Knowledge of patient specific risk factors will improve Loraine Leriche N/A or DELETE if not current risk factor) 09/25/2022 1610 by Beryle Lathe, RN Outcome: Adequate for  Discharge 09/25/2022 0810 by Beryle Lathe, RN Outcome: Progressing   Problem: Ischemic Stroke/TIA Tissue Perfusion: Goal: Complications of ischemic stroke/TIA will be minimized 09/25/2022 1610 by Shyra Emile, Quitman Livings, RN Outcome: Adequate for Discharge 09/25/2022 0810 by Beryle Lathe, RN  Outcome: Progressing   Problem: Coping: Goal: Will verbalize positive feelings about self 09/25/2022 1610 by Alyona Romack, Quitman Livings, RN Outcome: Adequate for Discharge 09/25/2022 0810 by Beryle Lathe, RN Outcome: Progressing Goal: Will identify appropriate support needs 09/25/2022 1610 by Sherwood Castilla, Quitman Livings, RN Outcome: Adequate for Discharge 09/25/2022 0810 by Beryle Lathe, RN Outcome: Progressing   Problem: Health Behavior/Discharge Planning: Goal: Ability to manage health-related needs will improve 09/25/2022 1610 by Gustavus Haskin, Quitman Livings, RN Outcome: Adequate for Discharge 09/25/2022 0810 by Beryle Lathe, RN Outcome: Progressing Goal: Goals will be collaboratively established with patient/family 09/25/2022 1610 by Beryle Lathe, RN Outcome: Adequate for Discharge 09/25/2022 0810 by Beryle Lathe, RN Outcome: Progressing   Problem: Self-Care: Goal: Ability to participate in self-care as condition permits will improve 09/25/2022 1610 by Evamarie Raetz, Quitman Livings, RN Outcome: Adequate for Discharge 09/25/2022 0810 by Beryle Lathe, RN Outcome: Progressing Goal: Verbalization of feelings and concerns over difficulty with self-care will improve 09/25/2022 1610 by Dimitry Holsworth, Quitman Livings, RN Outcome: Adequate for Discharge 09/25/2022 0810 by Beryle Lathe, RN Outcome: Progressing Goal: Ability to communicate needs accurately will improve 09/25/2022 1610 by Talmadge Ganas, Quitman Livings, RN Outcome: Adequate for Discharge 09/25/2022 0810 by Beryle Lathe, RN Outcome: Progressing   Problem: Nutrition: Goal: Risk of aspiration will decrease 09/25/2022 1610 by Cassidy Tashiro, Quitman Livings,  RN Outcome: Adequate for Discharge 09/25/2022 0810 by Beryle Lathe, RN Outcome: Progressing Goal: Dietary intake will improve 09/25/2022 1610 by Turquoise Esch, Quitman Livings, RN Outcome: Adequate for Discharge 09/25/2022 0810 by Beryle Lathe, RN Outcome: Progressing

## 2022-09-25 NOTE — Progress Notes (Signed)
*  PRELIMINARY RESULTS* Echocardiogram 2D Echocardiogram has been performed.  Cristela Blue 09/25/2022, 8:01 AM

## 2022-09-25 NOTE — Progress Notes (Signed)
Neurology Progress Note  Subjective: - Symptoms improving, no acute complaints, eager for discharge - Notes last dose of Eliquis was Sunday morning  Exam: Current vital signs: BP (!) 134/105 (BP Location: Right Arm)   Pulse (!) 50   Temp 97.8 F (36.6 C)   Resp 15   Ht 5\' 9"  (1.753 m)   Wt 86.2 kg   SpO2 95%   BMI 28.06 kg/m  Vital signs in last 24 hours: Temp:  [97.4 F (36.3 C)-98.1 F (36.7 C)] 97.8 F (36.6 C) (08/19 1143) Pulse Rate:  [50-90] 50 (08/19 1143) Resp:  [15-18] 15 (08/19 1143) BP: (126-163)/(87-111) 134/105 (08/19 1143) SpO2:  [95 %-99 %] 95 % (08/19 1143)   Gen: In bed, comfortable  Resp: non-labored breathing, no grossly audible wheezing Cardiac: Perfusing extremities well  Abd: soft, nt  Neuro: MS: Awake, alert, follows commands, no aphasia or neglect CN: Right upper quadrantanopia, EOMI with slightly saccadic pursuits (no ptosis or diplopia), face with a slight left facial droop, reduced sensation on the left side of the face, hard of hearing at baseline, uvula elevates symmetrically, shoulder shrug symmetric, tongue midline Motor: 4/5 in the left upper extremity in an upper motor neuron pattern.  5/5 in the right upper extremity.  Slight drift in the left leg Sensory: Reduced sensation on the left face arm and leg DTR: 2+ and symmetric brachioradialis, 3+ and symmetric patellar  NIH 6 1 point for right upper quadrantanopia (not previously noted by the patient) 1 point for left facial droop 1 point for left arm drift  1 point for left leg drift 1 point for sensory loss in the left face arm and leg   Pertinent Labs:  LDL 60, triglycerides 165, A1c 6.2%,  MRI brain personally reviewed, agree with radiology 1. Acute infarct in the posterior limb of the right internal capsule. No acute hemorrhage or significant mass effect. 2. Old cortical infarcts along the posterior aspect of the right superior frontal gyrus and the left inferior parietal  lobe. Old lacunar infarcts in the bilateral thalami and cerebellar hemispheres.   1. Left ventricular ejection fraction, by estimation, is 25 to 30%. The  left ventricle has severely decreased function. The left ventricle  demonstrates global hypokinesis. The left ventricular internal cavity size  was mildly dilated. Left ventricular diastolic parameters are indeterminate.   2. Right ventricular systolic function is normal. The right ventricular  size is normal. Tricuspid regurgitation signal is inadequate for assessing  PA pressure.   3. Left atrial size was mildly dilated.   4. Right atrial size was mildly dilated.   5. The mitral valve is normal in structure. Mild mitral valve  regurgitation. No evidence of mitral stenosis.   6. The aortic valve is normal in structure. Aortic valve regurgitation is  not visualized. Aortic valve sclerosis/calcification is present, without  any evidence of aortic stenosis.   7. Aortic dilatation noted. There is mild dilatation of the aortic root,  measuring 44 mm.   8. The inferior vena cava is normal in size with greater than 50%  respiratory variability, suggesting right atrial pressure of 3 mmHg.   9. Agitated saline contrast bubble study was negative, with no evidence  of any interatrial shunt.   CTA personally reviewed, agree with radiology 1. No acute large vessel occlusion. 2. 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 3. Unchanged severe, multifocal stenosis of the right ACA  A2/A3 segments. 4. 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 5. No hemodynamically significant stenosis of the carotid arteries. 6. Approximately 10 mL of ischemia in the brainstem and left MCA territory by CT perfusion.  Impression: Long discussion with patient and family at bedside (wife, daughter-in-law, granddaughter).  From a stroke risk perspective  he has both atrial fibrillation as well as severe intracranial atherosclerotic disease.  While there are no clinical trial data to support addition of aspirin to anticoagulation in this setting many neurologists do add a baby aspirin.  We discussed that this does increase his risk of symptomatic hemorrhage, however he and family confirmed he has never had issues with bleeding before and given the severity of his intracranial atherosclerotic disease he is interested in adding baby aspirin to his Eliquis.  Given time since symptom onset as well as size of the stroke, I do think it is safe to resume Eliquis at this time  Recommendations: -Start aspirin 81 mg tomorrow -Resume Eliquis this evening -Continue home anticholesterol therapy, LDL meeting goal of less than 70 -Further optimization of triglycerides per PCP -A1c meeting goal of less than 7% -Goal blood pressure normotension, out of the permissive hypertension window -Thank you for involving [place] in the care of this patient, an inpatient neurology will sign off at this time but please do reach out with any questions or concerns  Brooke Dare MD-PhD [facility] [phone]

## 2022-09-25 NOTE — Progress Notes (Signed)
OT Cancellation Note  Patient Details Name: Samuel Boyd MRN: [id number] DOB: 12-Jan-1946   Cancelled Treatment:    Reason Eval/Treat Not Completed: OT screened, no needs identified, will sign off. Chart reviewed. Per PT, pt back to baseline. No skilled OT needs at this time. Will sign off.   Arman Filter., MPH, MS, OTR/L ascom [phone] 09/25/22, 9:37 AM

## 2022-09-25 NOTE — Evaluation (Signed)
Speech Language Pathology Evaluation Patient Details Name: Samuel Boyd MRN: [id number] DOB: 07-26-1945 Today's Date: 09/25/2022 Time: 4098-1191 SLP Time Calculation (min) (ACUTE ONLY): 15 min  Problem List:  Patient Active Problem List   Diagnosis Date Noted   CVA (cerebral vascular accident) (HCC) 09/24/2022   Erythrocytosis 09/24/2022   Atherosclerosis of native arteries of extremity with intermittent claudication (HCC) 03/05/2022   Borderline diabetes mellitus 09/11/2018   Hyperlipemia, mixed 09/11/2018   Tobacco dependence 09/11/2018   Abdominal aortic aneurysm (AAA) without rupture (HCC) 06/11/2018   Chronic systolic CHF (congestive heart failure), NYHA class 2 (HCC) 10/30/2016   Paroxysmal A-fib (HCC) 03/10/2015   Benign essential hypertension 08/27/2014   Past Medical History:  Past Medical History:  Diagnosis Date   Arthritis    Atrial fibrillation (HCC)    Dysrhythmia    History of kidney stones    Hypertension    Pneumonia    Stroke ([facility])    2021   Past Surgical History:  Past Surgical History:  Procedure Laterality Date   CYSTOSCOPY/URETEROSCOPY/HOLMIUM LASER/STENT PLACEMENT Left 09/16/2018   Procedure: CYSTOSCOPY/URETEROSCOPY/HOLMIUM LASER/STENT PLACEMENT, LEFT;  Surgeon: Vanna Scotland, MD;  Location: [facility] ORS;  Service: Urology;  Laterality: Left;   INGUINAL HERNIA REPAIR Left 09/16/2018   Procedure: HERNIA REPAIR INGUINAL ADULT;  Surgeon: Carolan Shiver, MD;  Location: [facility] ORS;  Service: General;  Laterality: Left;   HPI:  Samuel Boyd is a 77 y.o. male with medical history significant of HFrEF, atrial fibrillation, tobacco abuse, hypertension, history of CVA presenting with CVA.  Patient reports left-sided weakness over the past 1 to 2 days.  Family also at the bedside reporting similar.  Symptoms have been present since patient was doing outdoor activities.  Is also had worsening dysarthria over the past day or so.  No chest pain or  shortness of breath.  No nausea or vomiting.  Reported prior CVA back in 2021 involving the left ACA territory.  MRI revealed  Acute infarct in the posterior limb of the right internal  capsule. No acute hemorrhage or significant mass effect. Old  cortical infarcts along the posterior aspect of the right superior  frontal gyrus and the left inferior parietal lobe. Old lacunar  infarcts in the bilateral thalami and cerebellar hemispheres.   Assessment / Plan / Recommendation Clinical Impression  Pt presents with left side facial and lingual weakness that results in mild flaccid dysarthria. Pt's speech is c/b imprecise articulation and gliding of some sounds resulting in reduced speech intelligibility at the sentence and conversation level of ~80% in a noisy environment. Pt's speech improved with slower rate, over articulation and increased vocal intensity. He is likely to benefit from Outpatient ST services.    SLP Assessment  SLP Recommendation/Assessment: All further Speech Lanaguage Pathology  needs can be addressed in the next venue of care SLP Visit Diagnosis: Dysarthria and anarthria (R47.1)    Recommendations for follow up therapy are one component of a multi-disciplinary discharge planning process, led by the attending physician.  Recommendations may be updated based on patient status, additional functional criteria and insurance authorization.    Follow Up Recommendations  Outpatient SLP    Assistance Recommended at Discharge  Intermittent Supervision/Assistance  Functional Status Assessment Patient has had a recent decline in their functional status and/or demonstrates limited ability to make significant improvements in function in a reasonable and predictable amount of time  Frequency and Duration  N/A         SLP Evaluation Cognition  Overall Cognitive Status: Within Functional Limits for tasks assessed Orientation Level: Oriented X4       Comprehension    N/A   Expression    N/A  Oral / Motor  Oral Motor/Sensory Function Overall Oral Motor/Sensory Function: Mild impairment Facial ROM: Reduced left Facial Symmetry: Abnormal symmetry left Facial Strength: Reduced left Lingual ROM: Reduced left Lingual Symmetry: Abnormal symmetry left Lingual Strength: Reduced Lingual Sensation: Within Functional Limits Velum: Within Functional Limits Mandible: Within Functional Limits Motor Speech Overall Motor Speech: Impaired Respiration: Within functional limits Phonation: Normal Resonance: Within functional limits Articulation: Impaired Level of Impairment: Phrase Intelligibility: Intelligibility reduced Word: 75-100% accurate Phrase: 50-74% accurate Sentence: 50-74% accurate Conversation: 50-74% accurate Motor Planning: Witnin functional limits Motor Speech Errors: Not applicable Effective Techniques: Slow rate;Increased vocal intensity;Over-articulate            Lashon Hillier B. Dreama Saa, M.S., CCC-SLP, Tree surgeon Certified Brain Injury Specialist [facility]  [facility] Office [phone] Ascom [phone] Fax [phone]

## 2022-09-25 NOTE — Discharge Summary (Signed)
Physician Discharge Summary   Patient: Samuel Boyd MRN: [id number] DOB: [birth year]  Admit date:     09/24/2022  Discharge date: 09/25/22  Discharge Physician: Marrion Coy   PCP: Marisue Ivan, MD   Recommendations at discharge:   Follow-up with PCP in 1 week. Follow-up with cardiology in 1 to 2 weeks. Follow-up with neurology in 1 month.  Discharge Diagnoses: Principal Problem:   CVA (cerebral vascular accident) [facility]) Active Problems:   Abdominal aortic aneurysm (AAA) without rupture (HCC)   Benign essential hypertension   Borderline diabetes mellitus   Chronic systolic CHF (congestive heart failure), NYHA class 2 (HCC)   Paroxysmal A-fib (HCC)   Tobacco dependence   Erythrocytosis  Resolved Problems:   * No resolved hospital problems. *  Hospital Course:  Samuel Boyd is a 78 y.o. male with medical history significant of HFrEF, atrial fibrillation, tobacco abuse, hypertension, history of CVA presenting with CVA.  Patient reports left-sided weakness over the past 1 to 2 days MRI of the brain showed a right internal capsule stroke.  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 Patient is monitored overnight, seen by PT/OT, left weakness much improved today.  Patient does not want home care or physical therapy.  At this point, patient is medically stable to be discharged. Discussed with neurology, stroke may be caused by obstruction of both vertebral arteries in additional to atrial fibrillation.  As result, patient will be treated with Eliquis and aspirin.   Assessment and Plan: * CVA (cerebral vascular accident) (HCC) As above.  Erythrocytosis secondary to tobacco abuse. Tobacco dependence Advised to quit,  nicotine patch prescribed.  Paroxysmal A-fib (HCC)  Eliquis.  Chronic systolic CHF (congestive heart failure), NYHA class 2 (HCC) .  Echocardiogram ejection fraction 25 to 30%, patient be followed by cardiology as outpatient.   Borderline diabetes mellitus Follow-up with PCP.  Benign essential hypertension Resume home treatment.  Abdominal aortic aneurysm (AAA) without rupture (HCC) Follow-up with PCP.        Consultants: Neurology. Procedures performed: None  Disposition: Home Diet recommendation:  Discharge Diet Orders (From admission, onward)     Start     Ordered   09/25/22 0000  Diet - low sodium heart healthy        09/25/22 1603           Cardiac diet DISCHARGE MEDICATION: Allergies as of 09/25/2022   No Known Allergies      Medication List     STOP taking these medications    Farxiga 10 MG Tabs tablet Generic drug: dapagliflozin propanediol   SUPER B COMPLEX PO       TAKE these medications    aspirin EC 81 MG tablet Take 1 tablet (81 mg total) by mouth daily. Swallow whole.   Eliquis 5 MG Tabs tablet Generic drug: apixaban Take 5 mg by mouth 2 (two) times daily.   empagliflozin 10 MG Tabs tablet Commonly known as: JARDIANCE Take 1 tablet by mouth daily.   Fish Oil 1200 MG Caps Take 1,200 mg by mouth every evening.   furosemide 20 MG tablet Commonly known as: LASIX TAKE 1 TABLET BY MOUTH EVERY DAY, TAKE 2ND DOSE IN 6 HOURS ONLY AS NEEDED   lisinopril 5 MG tablet Commonly known as:  ZESTRIL Take 5 mg by mouth every evening.   loratadine 10 MG tablet Commonly known as: CLARITIN Take 10 mg by mouth daily.   metoprolol tartrate 50 MG tablet Commonly known as: LOPRESSOR Take 50 mg by mouth 2 (two) times daily. What changed: Another medication with the same name was removed. Continue taking this medication, and follow the directions you see here.   nicotine 21 mg/24hr patch Commonly known as: NICODERM CQ - dosed in mg/24  hours Place 1 patch (21 mg total) onto the skin daily. Start taking on: September 26, 2022   rosuvastatin 40 MG tablet Commonly known as: CRESTOR Take 40 mg by mouth daily with supper.   VITAMIN D-3 PO Take by mouth daily at 6 (six) AM.        Follow-up Information     Marisue Ivan, MD Follow up in 1 week(s).   Specialty: Family Medicine Contact information: [address] [facility] [place] [address] [phone]         [facility] Follow up in 1 month(s).   Contact information: [address] [place] [address] [phone]        Lamar Blinks, MD Follow up in 2 week(s).   Specialty: Cardiology Contact information: [address] [facility] [place] [address] [phone]                Discharge Exam: Ceasar Mons Weights   09/24/22 1028  Weight: 86.2 kg   General exam: Appears calm and comfortable  Respiratory system: Clear to auscultation. Respiratory effort normal. Cardiovascular system: S1 & S2 heard, RRR. No JVD, murmurs, rubs, gallops or clicks. No pedal edema. Gastrointestinal system: Abdomen is nondistended, soft and nontender. No organomegaly or masses felt. Normal bowel sounds heard. Central nervous system: Alert and oriented. No focal neurological deficits. Extremities: Symmetric 5 x 5 power. Skin: No rashes, lesions or ulcers Psychiatry: Judgement and insight appear normal. Mood & affect appropriate.    Condition at discharge: fair  The results of significant diagnostics from this hospitalization (including imaging, microbiology, ancillary and laboratory) are listed below for reference.   Imaging Studies: ECHOCARDIOGRAM COMPLETE  Result Date: 09/25/2022    ECHOCARDIOGRAM REPORT   Patient Name:   Samuel Boyd Date of Exam: 09/25/2022 Medical Rec #:  [id number]          Height:       69.0 in Accession #:    [id number]          Weight:       190.0 lb Date of Birth:  [birth year]         BSA:          2.021 m Patient Age:    76 years           BP:           138/93 mmHg Patient Gender: M                  HR:           81 bpm. Exam Location:  [facility] Procedure: 2D Echo, Cardiac Doppler, Color Doppler and Saline Contrast Bubble            Study Indications:     TIA G45.9  History:         Patient has no prior history of Echocardiogram examinations.  Stroke, Arrythmias:Atrial Fibrillation; Risk                  Factors:Hypertension.  Sonographer:     Cristela Blue Referring Phys:  [id number] Francoise Schaumann [place] Diagnosing Phys: Lorine Bears MD IMPRESSIONS  1. Left ventricular ejection fraction, by estimation, is 25 to 30%. The left ventricle has severely decreased function. The left ventricle demonstrates global hypokinesis. The left ventricular internal cavity size was mildly dilated. Left ventricular diastolic parameters are indeterminate.  2. Right ventricular systolic function is normal. The right ventricular size is normal. Tricuspid regurgitation signal is inadequate for assessing PA pressure.  3. Left atrial size was mildly dilated.  4. Right atrial size was mildly dilated.  5. The mitral valve is normal in structure. Mild mitral valve regurgitation. No evidence of mitral stenosis.  6. The aortic valve is normal in structure. Aortic valve regurgitation is not visualized. Aortic valve sclerosis/calcification is present, without any evidence of aortic stenosis.  7. Aortic dilatation noted. There is mild dilatation of the aortic root, measuring 44 mm.  8. The inferior vena cava is normal in size with greater than 50% respiratory variability, suggesting right atrial pressure of 3 mmHg.  9. Agitated saline contrast bubble study was negative, with no evidence of any interatrial shunt. FINDINGS  Left Ventricle: Left ventricular ejection fraction, by estimation, is 25 to 30%. The left ventricle has severely decreased function. The left  ventricle demonstrates global hypokinesis. The left ventricular internal cavity size was mildly dilated. There is no left ventricular hypertrophy. Left ventricular diastolic parameters are indeterminate. Right Ventricle: The right ventricular size is normal. No increase in right ventricular wall thickness. Right ventricular systolic function is normal. Tricuspid regurgitation signal is inadequate for assessing PA pressure. Left Atrium: Left atrial size was mildly dilated. Right Atrium: Right atrial size was mildly dilated. Pericardium: There is no evidence of pericardial effusion. Mitral Valve: The mitral valve is normal in structure. Mild mitral valve regurgitation. No evidence of mitral valve stenosis. Tricuspid Valve: The tricuspid valve is normal in structure. Tricuspid valve regurgitation is not demonstrated. No evidence of tricuspid stenosis. Aortic Valve: The aortic valve is normal in structure. Aortic valve regurgitation is not visualized. Aortic valve sclerosis/calcification is present, without any evidence of aortic stenosis. Aortic valve mean gradient measures 2.0 mmHg. Aortic valve peak  gradient measures 3.4 mmHg. Pulmonic Valve: The pulmonic valve was normal in structure. Pulmonic valve regurgitation is trivial. No evidence of pulmonic stenosis. Aorta: Aortic dilatation noted. There is mild dilatation of the aortic root, measuring 44 mm. Venous: The inferior vena cava is normal in size with greater than 50% respiratory variability, suggesting right atrial pressure of 3 mmHg. IAS/Shunts: No atrial level shunt detected by color flow Doppler. Agitated saline contrast was given intravenously to evaluate for intracardiac shunting. Agitated saline contrast bubble study was negative, with no evidence of any interatrial shunt.  LEFT VENTRICLE PLAX 2D LVIDd:         5.50 cm LVIDs:         5.10 cm LV PW:         1.30 cm LV IVS:        1.10 cm  LV Volumes (MOD) LV vol d, MOD A2C: 108.0 ml LV vol d, MOD A4C: 152.0  ml LV vol s, MOD A2C: 94.4 ml LV vol s, MOD A4C: 108.0 ml LV SV MOD A2C:     13.6 ml LV SV MOD A4C:     152.0 ml LV SV  MOD BP:      31.7 ml RIGHT VENTRICLE RV Basal diam:  3.20 cm RV Mid diam:    3.10 cm RV S prime:     7.51 cm/s TAPSE (M-mode): 1.5 cm LEFT ATRIUM           Index        RIGHT ATRIUM           Index LA diam:      4.30 cm 2.13 cm/m   RA Area:     21.90 cm LA Vol (A2C): 50.2 ml 24.83 ml/m  RA Volume:   57.90 ml  28.64 ml/m LA Vol (A4C): 64.5 ml 31.91 ml/m  AORTIC VALVE AV Vmax:           91.60 cm/s AV Vmean:          60.700 cm/s AV VTI:            0.146 m AV Peak Grad:      3.4 mmHg AV Mean Grad:      2.0 mmHg LVOT Vmax:         50.30 cm/s LVOT Vmean:        31.000 cm/s LVOT VTI:          0.082 m LVOT/AV VTI ratio: 0.56  AORTA Ao Root diam: 4.05 cm MITRAL VALVE               TRICUSPID VALVE MV Area (PHT): 5.23 cm    TR Peak grad:   16.3 mmHg MV Decel Time: 145 msec    TR Vmax:        202.00 cm/s MV E velocity: 81.80 cm/s                            SHUNTS                            Systemic VTI: 0.08 m Lorine Bears MD Electronically signed by Lorine Bears MD Signature Date/Time: 09/25/2022/3:27:50 PM    Final    MR BRAIN WO CONTRAST  Result Date: 09/24/2022 CLINICAL DATA:  Neuro deficit, acute, stroke suspected. Left-sided weakness. EXAM: MRI HEAD WITHOUT CONTRAST TECHNIQUE: Multiplanar, multiecho pulse sequences of the brain and surrounding structures were obtained without intravenous contrast. COMPARISON:  CTA head/neck 09/24/2022. FINDINGS: Brain: Acute infarct in the posterior limb of the right internal capsule. No acute hemorrhage or significant mass effect. Old cortical infarcts along the posterior aspect of the right superior frontal gyrus and the left inferior parietal lobe. Old lacunar infarcts in the bilateral thalami and cerebellar hemispheres. No hydrocephalus or extra-axial collection. Vascular: Normal flow voids. Skull and upper cervical spine: Normal marrow signal.  Sinuses/Orbits: No acute findings. Other: None. IMPRESSION: 1. Acute infarct in the posterior limb of the right internal capsule. No acute hemorrhage or significant mass effect. 2. Old cortical infarcts along the posterior aspect of the right superior frontal gyrus and the left inferior parietal lobe. Old lacunar infarcts in the bilateral thalami and cerebellar hemispheres. Electronically Signed   By: Orvan Falconer M.D.   On: 09/24/2022 17:59   CT ANGIO HEAD NECK W WO CM W PERF (CODE STROKE)  Result Date: 09/24/2022 CLINICAL DATA:  Neuro deficit, acute, stroke suspected. Left-sided weakness and balance issues. Slurred speech and facial asymmetry. EXAM: CT ANGIOGRAPHY HEAD AND NECK CT PERFUSION BRAIN TECHNIQUE: Multidetector CT imaging of the head and neck was performed  using the standard protocol during bolus administration of intravenous contrast. Multiplanar CT image reconstructions and MIPs were obtained to evaluate the vascular anatomy. Carotid stenosis measurements (when applicable) are obtained utilizing NASCET criteria, using the distal internal carotid diameter as the denominator. Multiphase CT imaging of the brain was performed following IV bolus contrast injection. Subsequent parametric perfusion maps were calculated using RAPID software. RADIATION DOSE REDUCTION: This exam was performed according to the departmental dose-optimization program which includes automated exposure control, adjustment of the mA and/or kV according to patient size and/or use of iterative reconstruction technique. CONTRAST:  OMNIPAQUE IOHEXOL 350 MG/ML SOLN COMPARISON:  Head CT 09/24/2022. MRI brain 07/08/2019. MRA head/neck 07/25/2019. FINDINGS: CTA NECK FINDINGS Aortic arch: Three-vessel arch configuration. Atherosclerotic calcifications of the aortic arch and arch vessel origins. Arch vessel origins are patent. Right carotid system: No evidence of dissection, stenosis (50% or greater) or occlusion. Mixed plaque  along the right carotid bulb and proximal right cervical ICA with calcified plaque along the distal right cervical ICA. Left carotid system: No evidence of dissection, stenosis (50% or greater) or occlusion. Predominantly calcified plaque along the left carotid bulb and proximal left cervical ICA. 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 Skeleton: Mild cervical spondylosis without high-grade spinal canal stenosis. Other neck: Unremarkable. Upper chest: Unremarkable. Review of the MIP images confirms the above findings CTA HEAD FINDINGS Anterior circulation: Calcified plaque along the carotid siphons without hemodynamically significant stenosis. Unchanged severe, multifocal stenosis of the right ACA A2/A3 segments. The left ACA and bilateral MCAs are patent proximally without stenosis or aneurysm. Distal branches are symmetric. 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 The SCAs, AICAs and PICAs are patent proximally. The PCAs are patent proximally without stenosis or aneurysm. Distal branches are symmetric. Venous sinuses: Early phase of contrast. Anatomic variants: Persistent fetal origin of the bilateral PCAs with hypoplastic P1 segments. Review of the MIP images confirms the above findings CT Brain Perfusion Findings: CBF (<30%) Volume: 0mL Perfusion (Tmax>6.0s) volume: 10mL Mismatch Volume: 10mL Ischemia location:Brainstem and left MCA territory. IMPRESSION: 1. No acute large vessel occlusion. 2. 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 3. Unchanged severe, multifocal stenosis of the right ACA A2/A3 segments. 4. 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 5. No hemodynamically significant stenosis of the carotid arteries. 6. Approximately 10  mL of ischemia in the brainstem and left MCA territory by CT perfusion. Aortic Atherosclerosis (ICD10-I70.0). Electronically Signed   By: Orvan Falconer M.D.   On: 09/24/2022 11:38   CT HEAD CODE STROKE WO CONTRAST  Result Date: 09/24/2022 CLINICAL DATA:  Code stroke.  Left arm weakness EXAM: CT HEAD WITHOUT CONTRAST TECHNIQUE: Contiguous axial images were obtained from the base of the skull through the vertex without intravenous contrast. RADIATION DOSE REDUCTION: This exam was performed according to the departmental dose-optimization program which includes automated exposure control, adjustment of the mA and/or kV according to patient size and/or use of iterative reconstruction technique. COMPARISON:  07/08/2019 FINDINGS: Brain: Chronic small vessel ischemic gliosis with chronic lacunar infarcts in the bilateral thalamus. Small chronic right superior frontal, left parietal, and right cerebellar infarcts. No acute hemorrhage, hydrocephalus, or masslike finding Vascular: No hyperdense vessel or unexpected calcification. Skull: Normal. Negative for fracture or focal lesion. Sinuses/Orbits: No acute finding. Other: Presumed sent in  epic chat ASPECTS [facility] Stroke Program Early CT Score) - Ganglionic level infarction (caudate, lentiform nuclei, internal capsule, insula, M1-M3 cortex): 7 - Supraganglionic infarction (M4-M6 cortex): 3 Total score (0-10 with 10 being normal): 10 IMPRESSION: 1. No acute finding. 2. Multifocal chronic infarcts affecting cortical and subcortical structures. Electronically Signed   By: Tiburcio Pea M.D.   On: 09/24/2022 10:54    Microbiology: Results for orders placed or performed during the hospital encounter of 09/12/18  SARS CORONAVIRUS 2 Nasal Swab Aptima Multi Swab     Status: None   Collection Time: 09/12/18  1:57 PM   Specimen: Aptima Multi Swab; Nasal Swab  Result Value Ref Range Status   SARS Coronavirus 2 NEGATIVE NEGATIVE Final    Comment: (NOTE) SARS-CoV-2  target nucleic acids are NOT DETECTED. The SARS-CoV-2 RNA is generally detectable in upper and lower respiratory specimens during the acute phase of infection. Negative results do not preclude SARS-CoV-2 infection, do not rule out co-infections with other pathogens, and should not be used as the sole basis for treatment or other patient management decisions. Negative results must be combined with clinical observations, patient history, and epidemiological information. The expected result is Negative. Fact Sheet for Patients: HairSlick.no Fact Sheet for Healthcare Providers: quierodirigir.com This test is not yet approved or cleared by the [place] FDA and  has been authorized for detection and/or diagnosis of SARS-CoV-2 by FDA under an Emergency Use Authorization (EUA). This EUA will remain  in effect (meaning this test can be used) for the duration of the COVID-19 declaration under Section 56 4(b)(1) of the Act, 21 U.S.C. section 360bbb-3(b)(1), unless the authorization is terminated or revoked sooner. Performed at [facility], [address] [place], [place] [address]     Labs: CBC: Recent Labs  Lab 09/24/22 1041  WBC 7.0  NEUTROABS 3.9  HGB 18.4*  HCT 55.4*  MCV 100.7*  PLT 142*   Basic Metabolic Panel: Recent Labs  Lab 09/24/22 1041 09/24/22 1058 09/25/22 0309  NA 139  --  139  K 4.0  --  3.5  CL 107  --  108  CO2 22  --  23  GLUCOSE 117*  --  104*  BUN 22  --  24*  CREATININE 1.04 1.30* 1.02  CALCIUM 8.8*  --  8.6*   Liver Function Tests: Recent Labs  Lab 09/24/22 1041  AST 19  ALT 11  ALKPHOS 57  BILITOT 1.0  PROT 7.9  ALBUMIN 4.0   CBG: Recent Labs  Lab 09/24/22 1036 09/24/22 1628  GLUCAP 114* 174*    Discharge time spent: greater than 30 minutes.  Signed: Marrion Coy, MD [facility] 09/25/2022

## 2022-10-04 ENCOUNTER — Telehealth (INDEPENDENT_AMBULATORY_CARE_PROVIDER_SITE_OTHER): Payer: Self-pay | Admitting: Nurse Practitioner

## 2022-10-04 NOTE — Telephone Encounter (Signed)
Bring him with no studies to evaluate CTA head/neck with Dr. Gilda Crease

## 2022-10-04 NOTE — Telephone Encounter (Signed)
New message    Wife left a voicemail   Husband had CVA last Saturday was advised by Dr. Burnadette Pop to be seen sooner than 11/17/22.  Last office visit 05/12/22.

## 2022-11-17 ENCOUNTER — Ambulatory Visit (INDEPENDENT_AMBULATORY_CARE_PROVIDER_SITE_OTHER): Payer: Medicare Other | Admitting: Nurse Practitioner

## 2022-11-17 ENCOUNTER — Other Ambulatory Visit (INDEPENDENT_AMBULATORY_CARE_PROVIDER_SITE_OTHER): Payer: Medicare Other

## 2022-12-18 ENCOUNTER — Other Ambulatory Visit (INDEPENDENT_AMBULATORY_CARE_PROVIDER_SITE_OTHER): Payer: Self-pay | Admitting: Nurse Practitioner

## 2022-12-18 DIAGNOSIS — I7143 Infrarenal abdominal aortic aneurysm, without rupture: Secondary | ICD-10-CM

## 2022-12-22 ENCOUNTER — Ambulatory Visit (INDEPENDENT_AMBULATORY_CARE_PROVIDER_SITE_OTHER): Payer: Medicare Other

## 2022-12-22 ENCOUNTER — Encounter (INDEPENDENT_AMBULATORY_CARE_PROVIDER_SITE_OTHER): Payer: Self-pay | Admitting: Nurse Practitioner

## 2022-12-22 ENCOUNTER — Ambulatory Visit (INDEPENDENT_AMBULATORY_CARE_PROVIDER_SITE_OTHER): Payer: Medicare Other | Admitting: Nurse Practitioner

## 2022-12-22 VITALS — BP 123/84 | HR 84 | Resp 18 | Ht 68.0 in | Wt 193.0 lb

## 2022-12-22 DIAGNOSIS — I7143 Infrarenal abdominal aortic aneurysm, without rupture: Secondary | ICD-10-CM

## 2022-12-22 DIAGNOSIS — I10 Essential (primary) hypertension: Secondary | ICD-10-CM | POA: Diagnosis not present

## 2022-12-22 DIAGNOSIS — E782 Mixed hyperlipidemia: Secondary | ICD-10-CM | POA: Diagnosis not present

## 2022-12-22 NOTE — Progress Notes (Deleted)
Subjective:    Patient ID: Samuel Boyd, male    DOB: November 23, 1945, 77 y.o.   MRN: [id number] Chief Complaint  Patient presents with   Follow-up    f/u in 6 months with AAA -    HPI  Review of Systems     Objective:   Physical Exam  BP 123/84 (BP Location: Right Arm)   Pulse 84   Resp 18   Ht 5\' 8"  (1.727 m)   Wt 193 lb (87.5 kg)   BMI 29.35 kg/m   Past Medical History:  Diagnosis Date   Arthritis    Atrial fibrillation (HCC)    Dysrhythmia    History of kidney stones    Hypertension    Pneumonia    Stroke [facility])    2021    Social History   Socioeconomic History   Marital status: Married    Spouse name: Not on file   Number of children: Not on file   Years of education: Not on file   Highest education level: Not on file  Occupational History   Not on file  Tobacco Use   Smoking status: Some Days    Current packs/day: 0.50    Types: Cigarettes   Smokeless tobacco: Never  Vaping Use   Vaping status: Not on file  Substance and Sexual Activity   Alcohol use: Never   Drug use: Never   Sexual activity: Not on file  Other Topics Concern   Not on file  Social History Narrative   Not on file   Social Determinants of Health   Financial Resource Strain: Low Risk  (09/29/2022)   Received from [facility]   Overall Financial Resource Strain (CARDIA)    Difficulty of Paying Living Expenses: Not hard at all  Food Insecurity: No Food Insecurity (09/29/2022)   Received from [facility]   Hunger Vital Sign    Worried About Running Out of Food in the Last Year: Never true    Ran Out of Food in the Last Year: Never true  Transportation Needs: No Transportation Needs (09/29/2022)   Received from [facility] - Transportation    In the past 12 months, has lack of transportation kept you from medical appointments or from getting medications?: No    Lack of Transportation (Non-Medical): No   Physical Activity: Unknown (07/18/2019)   Received from [facility], [facility]   Exercise Vital Sign    Days of Exercise per Week: 0 days    Minutes of Exercise per Session: Not on file  Stress: No Stress Concern Present (07/18/2019)   Received from [facility], [facility]   [employer] of Occupational Health - Occupational Stress Questionnaire    Feeling of Stress : Not at all  Social Connections: Unknown (07/18/2019)   Received from [facility], [facility]   Social Connection and Isolation Panel [NHANES]    Frequency of Communication with Friends and Family: Not on file    Frequency of Social Gatherings with Friends and Family: Once a week    Attends Religious Services: Not on file    Active Member of Clubs or Organizations: No    Attends Banker Meetings: Never    Marital Status: Married  Catering manager Violence: Not At Risk (09/24/2022)   Humiliation, Afraid, Rape, and Kick questionnaire    Fear of Current or Ex-Partner:  No    Emotionally Abused: No    Physically Abused: No    Sexually Abused: No    Past Surgical History:  Procedure Laterality Date   CYSTOSCOPY/URETEROSCOPY/HOLMIUM LASER/STENT PLACEMENT Left 09/16/2018   Procedure: CYSTOSCOPY/URETEROSCOPY/HOLMIUM LASER/STENT PLACEMENT, LEFT;  Surgeon: Vanna Scotland, MD;  Location: [facility] ORS;  Service: Urology;  Laterality: Left;   INGUINAL HERNIA REPAIR Left 09/16/2018   Procedure: HERNIA REPAIR INGUINAL ADULT;  Surgeon: Carolan Shiver, MD;  Location: [facility] ORS;  Service: General;  Laterality: Left;    History reviewed. No pertinent family history.  No Known Allergies     Latest Ref Rng & Units 09/24/2022   10:41 AM 07/08/2019   10:58 AM  CBC  WBC 4.0 - 10.5 K/uL 7.0  6.4   Hemoglobin 13.0 - 17.0 g/dL 54.0  98.1   Hematocrit 39.0 - 52.0 % 55.4  47.5   Platelets 150 - 400 K/uL 142  151        CMP     Component Value Date/Time   NA 139 09/25/2022 0309   K 3.5 09/25/2022 0309   CL 108 09/25/2022 0309   CO2 23 09/25/2022 0309   GLUCOSE 104 (H) 09/25/2022 0309   BUN 24 (H) 09/25/2022 0309   CREATININE 1.02 09/25/2022 0309   CALCIUM 8.6 (L) 09/25/2022 0309   PROT 7.9 09/24/2022 1041   ALBUMIN 4.0 09/24/2022 1041   AST 19 09/24/2022 1041   ALT 11 09/24/2022 1041   ALKPHOS 57 09/24/2022 1041   BILITOT 1.0 09/24/2022 1041   GFRNONAA >60 09/25/2022 0309     No results found.     Assessment & Plan:   1. Infrarenal abdominal aortic aneurysm (AAA) without rupture (HCC) ***  2. Benign essential hypertension ***  3. Hyperlipemia, mixed ***   Current Outpatient Medications on File Prior to Visit  Medication Sig Dispense Refill   apixaban (ELIQUIS) 5 MG TABS tablet Take 5 mg by mouth 2 (two) times daily.     aspirin EC 81 MG tablet Take 1 tablet (81 mg total) by mouth daily. Swallow whole. 150 tablet 0   Cholecalciferol (VITAMIN D-3 PO) Take by mouth daily at 6 (six) AM.     empagliflozin (JARDIANCE) 10 MG TABS tablet Take 1 tablet by mouth daily.     furosemide (LASIX) 20 MG tablet TAKE 1 TABLET BY MOUTH EVERY DAY, TAKE 2ND DOSE IN 6 HOURS ONLY AS NEEDED     lisinopril (ZESTRIL) 5 MG tablet Take 5 mg by mouth every evening.     loratadine (CLARITIN) 10 MG tablet Take 10 mg by mouth daily.     metoprolol tartrate (LOPRESSOR) 50 MG tablet Take 50 mg by mouth 2 (two) times daily.     nicotine (NICODERM CQ - DOSED IN MG/24 HOURS) 21 mg/24hr patch Place 1 patch (21 mg total) onto the skin daily. 28 patch 0   Omega-3 Fatty Acids (FISH OIL) 1200 MG CAPS Take 1,200 mg by mouth every evening.     rosuvastatin (CRESTOR) 40 MG tablet Take 40 mg by mouth daily with supper.     No current facility-administered medications on file prior to visit.    There are no Patient Instructions on file for this visit. No follow-ups on file.   Georgiana Spinner, NP

## 2023-01-01 NOTE — Progress Notes (Signed)
Subjective:    Patient ID: Samuel Boyd, male    DOB: 09/18/1945, 77 y.o.   MRN: [id number] Chief Complaint  Patient presents with   Follow-up    f/u in 6 months with AAA -    The patient returns to the office for surveillance of a known abdominal aortic aneurysm.  The patient was last seen in 2020 for his abdominal aortic aneurysm and it measured 3.4 cm at that time.  The patient also had a recent CT scan which shows a 4.5 cm ascending thoracic aortic aneurysm.  Patient denies abdominal pain or back pain, no other abdominal complaints. No changes suggesting embolic episodes.   There have been no interval changes in the patient's overall health care since his last visit.  Patient denies amaurosis fugax or TIA symptoms. There is no history of claudication or rest pain symptoms of the lower extremities. The patient denies angina or shortness of breath.   Duplex US of the aorta and iliac arteries shows an AAA measured 4.70 cm with previous measure of 4.83 cm    Review of Systems  Gastrointestinal:  Negative for abdominal pain.  All other systems reviewed and are negative.      Objective:   Physical Exam Vitals reviewed.  HENT:     Head: Normocephalic.  Cardiovascular:     Rate and Rhythm: Normal rate.     Pulses: Normal pulses.  Pulmonary:     Effort: Pulmonary effort is normal.  Skin:    General: Skin is warm and dry.  Neurological:     Mental Status: He is alert and oriented to person, place, and time.  Psychiatric:        Mood and Affect: Mood normal.        Behavior: Behavior normal.        Thought Content: Thought content normal.        Judgment: Judgment normal.     BP 123/84 (BP Location: Right Arm)   Pulse 84   Resp 18   Ht 5\' 8"  (1.727 m)   Wt 193 lb (87.5 kg)   BMI 29.35 kg/m   Past Medical History:  Diagnosis Date   Arthritis    Atrial fibrillation (HCC)    Dysrhythmia    History of kidney stones    Hypertension    Pneumonia     Stroke [facility])    2021    Social History   Socioeconomic History   Marital status: Married    Spouse name: Not on file   Number of children: Not on file   Years of education: Not on file   Highest education level: Not on file  Occupational History   Not on file  Tobacco Use   Smoking status: Some Days    Current packs/day: 0.50    Types: Cigarettes   Smokeless tobacco: Never  Vaping Use   Vaping status: Not on file  Substance and Sexual Activity   Alcohol use: Never   Drug use: Never   Sexual activity: Not on file  Other Topics Concern   Not on file  Social History Narrative   Not on file   Social Determinants of Health   Financial Resource Strain: Low Risk  (09/29/2022)   Received from [facility]   Overall Financial Resource Strain (CARDIA)    Difficulty of Paying Living Expenses: Not hard at all  Food Insecurity: No Food Insecurity (09/29/2022)   Received from [facility]  Hunger Vital Sign    Worried About Running Out of Food in the Last Year: Never true    Ran Out of Food in the Last Year: Never true  Transportation Needs: No Transportation Needs (09/29/2022)   Received from [facility] - Transportation    In the past 12 months, has lack of transportation kept you from medical appointments or from getting medications?: No    Lack of Transportation (Non-Medical): No  Physical Activity: Unknown (07/18/2019)   Received from [facility], [facility]   Exercise Vital Sign    Days of Exercise per Week: 0 days    Minutes of Exercise per Session: Not on file  Stress: No Stress Concern Present (07/18/2019)   Received from [facility], [facility]   [employer] of Occupational Health - Occupational Stress Questionnaire    Feeling of Stress : Not at all  Social Connections: Unknown (07/18/2019)   Received from [facility], [facility]   Social Connection and Isolation Panel [NHANES]    Frequency of Communication with Friends and Family: Not on file    Frequency of Social Gatherings with Friends and Family: Once a week    Attends Religious Services: Not on file    Active Member of Clubs or Organizations: No    Attends Banker Meetings: Never    Marital Status: Married  Catering manager Violence: Not At Risk (09/24/2022)   Humiliation, Afraid, Rape, and Kick questionnaire    Fear of Current or Ex-Partner: No    Emotionally Abused: No    Physically Abused: No    Sexually Abused: No    Past Surgical History:  Procedure Laterality Date   CYSTOSCOPY/URETEROSCOPY/HOLMIUM LASER/STENT PLACEMENT Left 09/16/2018   Procedure: CYSTOSCOPY/URETEROSCOPY/HOLMIUM LASER/STENT PLACEMENT, LEFT;  Surgeon: Vanna Scotland, MD;  Location: [facility] ORS;  Service: Urology;  Laterality: Left;   INGUINAL HERNIA REPAIR Left 09/16/2018   Procedure: HERNIA REPAIR INGUINAL ADULT;  Surgeon: Carolan Shiver, MD;  Location: [facility] ORS;  Service: General;  Laterality: Left;    History reviewed. No pertinent family history.  No Known Allergies     Latest Ref Rng & Units 09/24/2022   10:41 AM 07/08/2019   10:58 AM  CBC  WBC 4.0 - 10.5 K/uL 7.0  6.4   Hemoglobin 13.0 - 17.0 g/dL 19.1  47.8   Hematocrit 39.0 - 52.0 % 55.4  47.5   Platelets 150 - 400 K/uL 142  151       CMP     Component Value Date/Time   NA 139 09/25/2022 0309   K 3.5 09/25/2022 0309   CL 108 09/25/2022 0309   CO2 23 09/25/2022 0309   GLUCOSE 104 (H) 09/25/2022 0309   BUN 24 (H) 09/25/2022 0309   CREATININE 1.02 09/25/2022 0309   CALCIUM 8.6 (L) 09/25/2022 0309   PROT 7.9 09/24/2022 1041   ALBUMIN 4.0 09/24/2022 1041   AST 19 09/24/2022 1041   ALT 11 09/24/2022 1041   ALKPHOS 57 09/24/2022 1041   BILITOT 1.0 09/24/2022 1041   GFRNONAA >60 09/25/2022 0309   GFRAA >60 07/08/2019 1058     No results found.      Assessment & Plan:   1. Infrarenal abdominal aortic aneurysm (AAA) without rupture (HCC) Recommend: No surgery or intervention is indicated at this time.  The patient has an asymptomatic abdominal aortic aneurysm that is greater than 4 cm but  less than 5 cm in maximal diameter.     I have reviewed the natural history of abdominal aortic aneurysm and the small risk of rupture for aneurysm less than 5 cm in size.  However, as these small aneurysms tend to enlarge over time, continued surveillance with ultrasound or CT scan is mandatory.   I have also discussed optimizing medical management with hypertension and lipid control and the importance of abstinence from tobacco.  The patient is also encouraged to exercise a minimum of 30 minutes 4 times a week.   Should the patient develop new onset abdominal or back pain or signs of peripheral embolization they are instructed to seek medical attention immediately and to alert the physician providing care that they have an aneurysm.   The patient voices their understanding.  I have scheduled the patient to return in 6 months with an aortic duplex.  2. Benign essential hypertension Continue antihypertensive medications as already ordered, these medications have been reviewed and there are no changes at this time.  3. Tobacco dependence Smoking cessation was discussed, 3-10 minutes spent on this topic specifically  4. Hyperlipemia, mixed Continue statin as ordered and reviewed, no changes at this time   Current Outpatient Medications on File Prior to Visit  Medication Sig Dispense Refill   apixaban (ELIQUIS) 5 MG TABS tablet Take 5 mg by mouth 2 (two) times daily.     aspirin EC 81 MG tablet Take 1 tablet (81 mg total) by mouth daily. Swallow whole. 150 tablet 0   Cholecalciferol (VITAMIN D-3 PO) Take by mouth daily at 6 (six) AM.     empagliflozin (JARDIANCE) 10 MG TABS tablet Take 1 tablet by mouth daily.     furosemide (LASIX) 20 MG  tablet TAKE 1 TABLET BY MOUTH EVERY DAY, TAKE 2ND DOSE IN 6 HOURS ONLY AS NEEDED     lisinopril (ZESTRIL) 5 MG tablet Take 5 mg by mouth every evening.     loratadine (CLARITIN) 10 MG tablet Take 10 mg by mouth daily.     metoprolol tartrate (LOPRESSOR) 50 MG tablet Take 50 mg by mouth 2 (two) times daily.     nicotine (NICODERM CQ - DOSED IN MG/24 HOURS) 21 mg/24hr patch Place 1 patch (21 mg total) onto the skin daily. 28 patch 0   Omega-3 Fatty Acids (FISH OIL) 1200 MG CAPS Take 1,200 mg by mouth every evening.     rosuvastatin (CRESTOR) 40 MG tablet Take 40 mg by mouth daily with supper.     No current facility-administered medications on file prior to visit.    There are no Patient Instructions on file for this visit. No follow-ups on file.   Georgiana Spinner, NP

## 2023-06-21 ENCOUNTER — Other Ambulatory Visit (INDEPENDENT_AMBULATORY_CARE_PROVIDER_SITE_OTHER): Payer: Medicare Other

## 2023-06-21 ENCOUNTER — Ambulatory Visit (INDEPENDENT_AMBULATORY_CARE_PROVIDER_SITE_OTHER): Payer: Medicare Other | Admitting: Nurse Practitioner

## 2023-06-21 ENCOUNTER — Other Ambulatory Visit (INDEPENDENT_AMBULATORY_CARE_PROVIDER_SITE_OTHER): Payer: Self-pay | Admitting: Nurse Practitioner

## 2023-06-21 DIAGNOSIS — I7143 Infrarenal abdominal aortic aneurysm, without rupture: Secondary | ICD-10-CM

## 2023-06-22 ENCOUNTER — Encounter (INDEPENDENT_AMBULATORY_CARE_PROVIDER_SITE_OTHER): Payer: Self-pay | Admitting: Nurse Practitioner

## 2023-06-22 ENCOUNTER — Ambulatory Visit (INDEPENDENT_AMBULATORY_CARE_PROVIDER_SITE_OTHER): Payer: Medicare Other

## 2023-06-22 ENCOUNTER — Ambulatory Visit (INDEPENDENT_AMBULATORY_CARE_PROVIDER_SITE_OTHER): Payer: Medicare Other | Admitting: Nurse Practitioner

## 2023-06-22 ENCOUNTER — Telehealth (INDEPENDENT_AMBULATORY_CARE_PROVIDER_SITE_OTHER): Payer: Self-pay | Admitting: Vascular Surgery

## 2023-06-22 VITALS — BP 132/93 | HR 90 | Resp 16 | Wt 191.2 lb

## 2023-06-22 DIAGNOSIS — I7143 Infrarenal abdominal aortic aneurysm, without rupture: Secondary | ICD-10-CM

## 2023-06-22 DIAGNOSIS — F17200 Nicotine dependence, unspecified, uncomplicated: Secondary | ICD-10-CM

## 2023-06-22 DIAGNOSIS — I7133 Infrarenal abdominal aortic aneurysm, ruptured: Secondary | ICD-10-CM

## 2023-06-22 DIAGNOSIS — I10 Essential (primary) hypertension: Secondary | ICD-10-CM | POA: Diagnosis not present

## 2023-06-22 DIAGNOSIS — E782 Mixed hyperlipidemia: Secondary | ICD-10-CM

## 2023-06-22 NOTE — Telephone Encounter (Signed)
 Spoke with patient's spouse and informed of no prior auth required for the CT ordered by [facility]. I provided the number to CT scheduling and advised to call and get scheduled. Once scheduled, I advised to call the office back and schedule a CT results appt with Dr. Prescilla Brod. Pt's spouse acknowledged and will call back to office to scheduled.

## 2023-06-22 NOTE — Progress Notes (Signed)
 Subjective:    Patient ID: Samuel Boyd, male    DOB: February 22, 1945, 78 y.o.   MRN: [id number] Chief Complaint  Patient presents with   Follow-up    6 month AAA    The patient returns to the office for surveillance of a known abdominal aortic aneurysm.  The patient was last seen in 2020 for his abdominal aortic aneurysm and it measured 3.4 cm at that time.   The patient also had a recent CT scan which shows a 4.5 cm ascending thoracic aortic aneurysm on 05/18/2021   Patient denies abdominal pain or back pain, no other abdominal complaints. No changes suggesting embolic episodes.    There have been no interval changes in the patient's overall health care since his last visit.   Patient denies amaurosis fugax or TIA symptoms. There is no history of claudication or rest pain symptoms of the lower extremities. The patient denies angina or shortness of breath.    Previous duplex US  of the aorta and iliac arteries on 12/22/2022 shows an AAA measured 4.70 cm with previous measure of 4.83 cm.  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     Review of Systems  Gastrointestinal:  Negative for abdominal distention and abdominal pain.  All other systems reviewed and are negative.      Objective:    Physical Exam Vitals reviewed.  HENT:     Head: Normocephalic.  Cardiovascular:     Rate and Rhythm: Normal rate.  Pulmonary:     Effort: Pulmonary effort is normal.  Skin:    General: Skin is warm and dry.  Neurological:     Mental Status: He is alert and oriented to person, place, and time.  Psychiatric:        Mood and Affect: Mood normal.        Behavior: Behavior normal.        Thought Content: Thought content normal.        Judgment: Judgment normal.     BP (!) 132/93   Pulse 90   Resp 16   Wt 191 lb 3.2 oz (86.7 kg)   BMI 29.07 kg/m   Past  Medical History:  Diagnosis Date   Arthritis    Atrial fibrillation (HCC)    Dysrhythmia    History of kidney stones    Hypertension    Pneumonia    Stroke [facility])    2021    Social History   Socioeconomic History   Marital status: Married    Spouse name: Not on file   Number of children: Not on file   Years of education: Not on file   Highest education level: Not on file  Occupational History   Not on file  Tobacco Use   Smoking status: Some Days    Current packs/day: 0.50    Types: Cigarettes   Smokeless tobacco: Never  Vaping Use   Vaping status: Not on file  Substance and Sexual Activity   Alcohol use: Never   Drug use: Never   Sexual activity: Not on file  Other Topics Concern   Not on file  Social History Narrative   Not on file   Social Drivers of Health   Financial Resource Strain: Low Risk  (09/29/2022)   Received from [facility]   Overall Financial Resource Strain (CARDIA)  Difficulty of Paying Living Expenses: Not hard at all  Food Insecurity: No Food Insecurity (09/29/2022)   Received from [facility]   Hunger Vital Sign    Worried About Running Out of Food in the Last Year: Never true    Ran Out of Food in the Last Year: Never true  Transportation Needs: No Transportation Needs (09/29/2022)   Received from [facility]    In the past 12 months, has lack of transportation kept you from medical appointments or from getting medications?: No    Lack of Transportation (Non-Medical): No  Physical Activity: Unknown (07/18/2019)   Received from [facility], [facility]   Exercise Vital Sign    Days of Exercise per Week: 0 days    Minutes of Exercise per Session: Not on file  Stress: No Stress Concern Present (07/18/2019)   Received from [facility], [facility]   [employer] of Occupational Health -  Occupational Stress Questionnaire    Feeling of Stress : Not at all  Social Connections: Unknown (07/18/2019)   Received from [facility], [facility]   Social Connection and Isolation Panel [NHANES]    Frequency of Communication with Friends and Family: Not on file    Frequency of Social Gatherings with Friends and Family: Once a week    Attends Religious Services: Not on file    Active Member of Clubs or Organizations: No    Attends Banker Meetings: Never    Marital Status: Married  Catering manager Violence: Not At Risk (09/24/2022)   Humiliation, Afraid, Rape, and Kick questionnaire    Fear of Current or Ex-Partner: No    Emotionally Abused: No    Physically Abused: No    Sexually Abused: No    Past Surgical History:  Procedure Laterality Date   CYSTOSCOPY/URETEROSCOPY/HOLMIUM LASER/STENT PLACEMENT Left 09/16/2018   Procedure: CYSTOSCOPY/URETEROSCOPY/HOLMIUM LASER/STENT PLACEMENT, LEFT;  Surgeon: Dustin Gimenez, MD;  Location: [facility] ORS;  Service: Urology;  Laterality: Left;   INGUINAL HERNIA REPAIR Left 09/16/2018   Procedure: HERNIA REPAIR INGUINAL ADULT;  Surgeon: Eldred Grego, MD;  Location: [facility] ORS;  Service: General;  Laterality: Left;    History reviewed. No pertinent family history.  No Known Allergies     Latest Ref Rng & Units 09/24/2022   10:41 AM 07/08/2019   10:58 AM  CBC  WBC 4.0 - 10.5 K/uL 7.0  6.4   Hemoglobin 13.0 - 17.0 g/dL 45.4  09.8   Hematocrit 39.0 - 52.0 % 55.4  47.5   Platelets 150 - 400 K/uL 142  151        CMP     Component Value Date/Time   NA 139 09/25/2022 0309   K 3.5 09/25/2022 0309   CL 108 09/25/2022 0309   CO2 23 09/25/2022 0309   GLUCOSE 104 (H) 09/25/2022 0309   BUN 24 (H) 09/25/2022 0309   CREATININE 1.02 09/25/2022 0309   CALCIUM 8.6 (L) 09/25/2022 0309   PROT 7.9 09/24/2022 1041   ALBUMIN 4.0 09/24/2022 1041   AST 19 09/24/2022 1041   ALT 11 09/24/2022 1041    ALKPHOS 57 09/24/2022 1041   BILITOT 1.0 09/24/2022 1041   GFRNONAA >60 09/25/2022 0309     No results found.     Assessment & Plan:   1. Infrarenal abdominal aortic aneurysm (AAA) without rupture (HCC) (Primary) Recommend:   The  patient has an abdominal aortic aneurysm that is 5.0 cm or greater by duplex scan.  The patient is otherwise in reasonable health.   Therefore, the patient should undergo repair of the AAA to prevent future leathal rupture.   Patient will require CT angiography of the abdomen and pelvis in order to appropriately plan repair of the AAA.    The risks and benefits as well as the alternative therapies was discussed in detail with the patient. All questions were answered. The patient agrees to move forward the AAA repair.  Therefore, a CT angiogram with be scheduled as an outpatient.  The patient will follow up with me in the office after the CT scan to review the study and finalize plan for repair.  2. Benign essential hypertension Continue antihypertensive medications as already ordered, these medications have been reviewed and there are no changes at this time.  3. Tobacco dependence Smoking cessation was discussed, 3-10 minutes spent on this topic specifically  4. Hyperlipemia, mixed Continue statin as ordered and reviewed, no changes at this time   Current Outpatient Medications on File Prior to Visit  Medication Sig Dispense Refill   apixaban (ELIQUIS) 5 MG TABS tablet Take 5 mg by mouth 2 (two) times daily.     Cholecalciferol (VITAMIN D-3 PO) Take by mouth daily at 6 (six) AM.     furosemide (LASIX) 20 MG tablet TAKE 1 TABLET BY MOUTH EVERY DAY, TAKE 2ND DOSE IN 6 HOURS ONLY AS NEEDED     lisinopril (ZESTRIL) 5 MG tablet Take 5 mg by mouth every evening.     loratadine (CLARITIN) 10 MG tablet Take 10 mg by mouth daily.     metoprolol tartrate (LOPRESSOR) 50 MG tablet Take 50 mg by mouth 2 (two) times daily.     Omega-3 Fatty Acids (FISH OIL)  1200 MG CAPS Take 1,200 mg by mouth every evening.     rosuvastatin (CRESTOR) 40 MG tablet Take 40 mg by mouth daily with supper.     nicotine  (NICODERM CQ  - DOSED IN MG/24 HOURS) 21 mg/24hr patch Place 1 patch (21 mg total) onto the skin daily. (Patient not taking: Reported on 06/22/2023) 28 patch 0   No current facility-administered medications on file prior to visit.    There are no Patient Instructions on file for this visit. No follow-ups on file.   Herron Fero E Jamile Rekowski, NP

## 2023-06-26 ENCOUNTER — Encounter (INDEPENDENT_AMBULATORY_CARE_PROVIDER_SITE_OTHER): Payer: Self-pay

## 2023-06-29 ENCOUNTER — Ambulatory Visit
Admission: RE | Admit: 2023-06-29 | Discharge: 2023-06-29 | Disposition: A | Discharge status: Home / Self Care | Source: Ambulatory Visit | Attending: Nurse Practitioner | Admitting: Nurse Practitioner

## 2023-06-29 DIAGNOSIS — I7133 Infrarenal abdominal aortic aneurysm, ruptured: Secondary | ICD-10-CM

## 2023-06-29 DIAGNOSIS — I723 Aneurysm of iliac artery: Secondary | ICD-10-CM

## 2023-06-29 HISTORY — DX: Aneurysm of iliac artery: I72.3

## 2023-06-29 MED ORDER — IOHEXOL 350 MG/ML SOLN
100.0000 mL | Freq: Once | INTRAVENOUS | Status: AC | PRN
  Administered 2023-06-29: 100 mL via INTRAVENOUS

## 2023-07-04 NOTE — H&P (View-Only) (Signed)
 MRN : [id number]  Samuel Boyd is a 78 y.o. (1945/09/19) male who presents with chief complaint of check circulation.  History of Present Illness:   The patient is seen for follow up evaluation of AAA status post CTA. There were no problems or complications related to the CT scan.   The patient denies interval development of abdominal or back pain. No new lower extremity pain or discoloration of the toes.   The patient denies interval anaurosis fugax. There is no recent history of TIA symptoms or focal motor deficits.   The patient denies PAD or claudication symptoms.   The patient denies recent episodes of angina or shortness of breath.  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  No outpatient medications have been marked as taking for the 07/05/23 encounter (Appointment) with Prescilla Brod, Ninette Basque, MD.    Past Medical History:  Diagnosis Date   Arthritis    Atrial fibrillation [facility])    Dysrhythmia    History of kidney stones    Hypertension    Pneumonia    Stroke [facility])    2021    Past Surgical History:  Procedure Laterality Date   CYSTOSCOPY/URETEROSCOPY/HOLMIUM LASER/STENT PLACEMENT Left 09/16/2018   Procedure: CYSTOSCOPY/URETEROSCOPY/HOLMIUM LASER/STENT PLACEMENT, LEFT;  Surgeon: Dustin Gimenez, MD;  Location: [facility] ORS;  Service: Urology;  Laterality: Left;   INGUINAL HERNIA REPAIR Left 09/16/2018   Procedure: HERNIA REPAIR INGUINAL ADULT;  Surgeon: Eldred Grego, MD;  Location: [facility] ORS;  Service: General;  Laterality: Left;    Social History Social History   Tobacco Use   Smoking status: Some Days    Current packs/day: 0.50    Types: Cigarettes   Smokeless tobacco: Never  Substance Use Topics   Alcohol use: Never   Drug use: Never    Family History No family history on file.  No Known Allergies   REVIEW OF SYSTEMS  (Negative unless checked)  Constitutional: [] Weight loss  [] Fever  [] Chills Cardiac: [] Chest pain   [] Chest pressure   [] Palpitations   [] Shortness of breath when laying flat   [] Shortness of breath with exertion. Vascular:  [x] Pain in legs with walking   [] Pain in legs at rest  [] History of DVT   [] Phlebitis   [] Swelling in legs   [] Varicose veins   [] Non-healing ulcers Pulmonary:   [] Uses home oxygen   [] Productive cough   [] Hemoptysis   [] Wheeze  [] COPD   [] Asthma Neurologic:  [] Dizziness   [] Seizures   [] History of stroke   [] History of TIA  [] Aphasia   [] Vissual changes   [] Weakness or numbness in arm   [] Weakness or numbness in leg Musculoskeletal:   [] Joint swelling   [] Joint pain   [] Low back pain Hematologic:  [] Easy bruising  [] Easy bleeding   [] Hypercoagulable state   [] Anemic Gastrointestinal:  [] Diarrhea   [] Vomiting  [] Gastroesophageal reflux/heartburn   [] Difficulty swallowing. Genitourinary:  [] Chronic kidney disease   [] Difficult urination  [] Frequent urination   [] Blood in urine Skin:  [] Rashes   [] Ulcers  Psychological:  [] History of  anxiety   []  History of major depression.  Physical Examination  There were no vitals filed for this visit. There is no height or weight on file to calculate BMI. Gen: WD/WN, NAD Head: Snead/AT, No temporalis wasting.  Ear/Nose/Throat: Hearing grossly intact, nares w/o erythema or drainage Eyes: PER, EOMI, sclera nonicteric.  Neck: Supple, no masses.  No bruit or JVD.  Pulmonary:  Good air movement, no audible wheezing, no use of accessory muscles.  Cardiac: RRR, normal S1, S2, no Murmurs. Vascular:  mild trophic changes, no open wounds Vessel Right Left  Radial Palpable Palpable  PT Not Palpable Not Palpable  DP Not Palpable Not Palpable  Gastrointestinal: soft, non-distended. No guarding/no peritoneal signs.  Musculoskeletal: M/S 5/5 throughout.  No visible deformity.  Neurologic: CN 2-12 intact. Pain and light touch intact in  extremities.  Symmetrical.  Speech is fluent. Motor exam as listed above. Psychiatric: Judgment intact, Mood & affect appropriate for pt's clinical situation. Dermatologic: No rashes or ulcers noted.  No changes consistent with cellulitis.   CBC Lab Results  Component Value Date   WBC 7.0 09/24/2022   HGB 18.4 (H) 09/24/2022   HCT 55.4 (H) 09/24/2022   MCV 100.7 (H) 09/24/2022   PLT 142 (L) 09/24/2022    BMET    Component Value Date/Time   NA 139 09/25/2022 0309   K 3.5 09/25/2022 0309   CL 108 09/25/2022 0309   CO2 23 09/25/2022 0309   GLUCOSE 104 (H) 09/25/2022 0309   BUN 24 (H) 09/25/2022 0309   CREATININE 1.02 09/25/2022 0309   CALCIUM 8.6 (L) 09/25/2022 0309   GFRNONAA >60 09/25/2022 0309   GFRAA >60 07/08/2019 1058   CrCl cannot be calculated (Patient's most recent lab result is older than the maximum 21 days allowed.).  COAG Lab Results  Component Value Date   INR 1.1 09/24/2022   INR 1.2 07/08/2019    Radiology CT Angio Abd/Pel w/ and/or w/o Result Date: 06/29/2023 CLINICAL DATA:  Abdominal aortic aneurysm (AAA), pre-op planning. Dx: Ruptured infrarenal abdominal aortic aneurysm EXAM: CTA ABDOMEN AND PELVIS WITHOUT AND WITH CONTRAST TECHNIQUE: Multidetector CT imaging of the abdomen and pelvis was performed using the standard protocol during bolus administration of intravenous contrast. Multiplanar reconstructed images and MIPs were obtained and reviewed to evaluate the vascular anatomy. RADIATION DOSE REDUCTION: This exam was performed according to the departmental dose-optimization program which includes automated exposure control, adjustment of the mA and/or kV according to patient size and/or use of iterative reconstruction technique. CONTRAST:  OMNIPAQUE  IOHEXOL  350 MG/ML SOLN COMPARISON:  Ultrasound aorta 08/25/2021, CT abdomen pelvis 08/28/2018 FINDINGS: VASCULAR Aorta: Interval increase in size of a 5.7 x 5.3 cm infrarenal abdominal aorta. Finding extends  approximately 6 cm in the craniocaudal dimension down to the aortic bifurcation. Question interval development of slight haziness of the aortic wall and the surrounding fat. Heterogeneous lumen suggestive of turbulent flow with no definite thrombus fissuration. No definite draped aorta sign. No dissection. Celiac: Mild to moderate narrowing of the origin of the celiac artery due to atherosclerotic plaque. Patent without evidence of aneurysm, dissection, vasculitis or significant stenosis. SMA: Mild narrowing of the superior mesenteric artery due to atherosclerotic plaque. Patent without evidence of aneurysm, dissection, vasculitis or significant stenosis. Renals: Both renal arteries are patent without evidence of aneurysm, dissection, vasculitis, fibromuscular dysplasia or significant stenosis. IMA: Patent without evidence of aneurysm, dissection, vasculitis or significant stenosis. Inflow: Patent without evidence of aneurysm, dissection, vasculitis or significant stenosis. Proximal Outflow:  Bilateral common iliac artery aneurysm measuring up to 2.1 cm on the right and 1.7 cm on the left. Veins: No obvious venous abnormality within the limitations of this arterial phase study. Review of the MIP images confirms the above findings. NON-VASCULAR Lower chest: Coronary artery calcification.  No acute abnormality. Hepatobiliary: No focal liver abnormality. No gallstones, gallbladder wall thickening, or pericholecystic fluid. No biliary dilatation. Pancreas: No focal lesion. Normal pancreatic contour. No surrounding inflammatory changes. No main pancreatic ductal dilatation. Spleen: Normal in size without focal abnormality. Adrenals/Urinary Tract: No adrenal nodule bilaterally. Bilateral kidneys enhance symmetrically. No hydronephrosis. No hydroureter. Fluid density lesions likely knee likely represent simple renal cysts. Simple renal cysts, in the absence of clinically indicated signs/symptoms, require no independent  follow-up. No nephroureterolithiasis bilaterally. The urinary bladder is unremarkable. Stomach/Bowel: Stomach is within normal limits. No evidence of bowel wall thickening or dilatation. Colonic diverticulosis. Appendix appears normal. Lymphatic: No lymphadenopathy. Reproductive: Prostate is unremarkable. Other: No intraperitoneal free fluid. No intraperitoneal free gas. No organized fluid collection. Musculoskeletal: Tiny fat containing umbilical hernia. No suspicious lytic or blastic osseous lesions. No acute displaced fracture. L5-S1 loss of intervertebral disc space with endplate sclerosis and posterior disc osteophyte complex formation. IMPRESSION: VASCULAR 1. Interval increase in size of a 5.7 x 5.3 cm (from 4.7 cm in 2023) infrarenal abdominal aorta (aneurysm 6 cm in the craniocaudal dimension down to the aortic bifurcation) with question development of slight haziness of the aortic wall and mild fat stranding of the surrounding fat. Recommend follow-up CT or MR as appropriate in 6 months and referral to or continued care with vascular specialist. (Ref.: J Vasc Surg. 2018; 67:2-77 and J Am Coll Radiol 2013;10(10):789-794.) 2. 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 NON-VASCULAR 1. Colonic diverticulosis with no acute diverticulitis. These results will be called to the ordering clinician or representative by the Radiologist Assistant, and communication documented in the PACS or [employer]. Electronically Signed   By: Morgane  Naveau M.D.   On: 06/29/2023 17:45   VAS US  AAA DUPLEX Result Date: 06/25/2023 ABDOMINAL AORTA STUDY Patient Name:  JOUSHUA DUGAR  Date of Exam:   06/22/2023 Medical Rec #: [id number]           Accession #:    [id number] Date of Birth: Dec 03, 1945          Patient Gender: M Patient Age:   57 years Exam Location:  [facility] Procedure:      VAS US  AAA DUPLEX Referring Phys: Sharla Davis  --------------------------------------------------------------------------------  Indications: Follow up exam for known AAA. Risk Factors: Hypertension, hyperlipidemia, current smoker, prior CVA. Limitations: Air/bowel gas.  Performing Technologist: Oneta Bilberry RVT  Examination Guidelines: A complete evaluation includes B-mode imaging, spectral Doppler, color Doppler, and power Doppler as needed of all accessible portions of each vessel. Bilateral testing is considered an integral part of a complete examination. Limited examinations for reoccurring indications may be performed as noted.  Abdominal Aorta Findings: +-----------+-------+----------+----------+--------+--------+--------+ Location   AP (cm)Trans (cm)PSV (cm/s)WaveformThrombusComments +-----------+-------+----------+----------+--------+--------+--------+ Proximal   2.96   3.23      72                                 +-----------+-------+----------+----------+--------+--------+--------+ Mid        3.07   3.22      45                                 +-----------+-------+----------+----------+--------+--------+--------+  Distal     5.56   5.52      26                                 +-----------+-------+----------+----------+--------+--------+--------+ RT CIA Prox2.4    2.2       74                                 +-----------+-------+----------+----------+--------+--------+--------+ LT CIA Prox2.1    2.0       28                                 +-----------+-------+----------+----------+--------+--------+--------+  Summary: Abdominal Aorta: There is evidence of abnormal dilatation of the distal Abdominal aorta. There is evidence of abnormal dilation of the Right Common Iliac artery and Left Common Iliac artery. The largest aortic measurement is 5.6 cm. The largest aortic diameter has increased compared to prior exam. Previous diameter measurement was 4.7 cm obtained on 06/22/2023.  *See table(s) above for  measurements and observations.  Electronically signed by Devon Fogo MD on 06/25/2023 at 8:42:45 AM.    Final      Assessment/Plan There are no diagnoses linked to this encounter.   Devon Fogo, MD  07/04/2023 7:55 AM

## 2023-07-04 NOTE — Progress Notes (Unsigned)
 MRN : [id number]  Samuel Boyd is a 78 y.o. (1945/09/19) male who presents with chief complaint of check circulation.  History of Present Illness:   The patient is seen for follow up evaluation of AAA status post CTA. There were no problems or complications related to the CT scan.   The patient denies interval development of abdominal or back pain. No new lower extremity pain or discoloration of the toes.   The patient denies interval anaurosis fugax. There is no recent history of TIA symptoms or focal motor deficits.   The patient denies PAD or claudication symptoms.   The patient denies recent episodes of angina or shortness of breath.  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  No outpatient medications have been marked as taking for the 07/05/23 encounter (Appointment) with Prescilla Brod, Ninette Basque, MD.    Past Medical History:  Diagnosis Date   Arthritis    Atrial fibrillation [facility])    Dysrhythmia    History of kidney stones    Hypertension    Pneumonia    Stroke [facility])    2021    Past Surgical History:  Procedure Laterality Date   CYSTOSCOPY/URETEROSCOPY/HOLMIUM LASER/STENT PLACEMENT Left 09/16/2018   Procedure: CYSTOSCOPY/URETEROSCOPY/HOLMIUM LASER/STENT PLACEMENT, LEFT;  Surgeon: Dustin Gimenez, MD;  Location: [facility] ORS;  Service: Urology;  Laterality: Left;   INGUINAL HERNIA REPAIR Left 09/16/2018   Procedure: HERNIA REPAIR INGUINAL ADULT;  Surgeon: Eldred Grego, MD;  Location: [facility] ORS;  Service: General;  Laterality: Left;    Social History Social History   Tobacco Use   Smoking status: Some Days    Current packs/day: 0.50    Types: Cigarettes   Smokeless tobacco: Never  Substance Use Topics   Alcohol use: Never   Drug use: Never    Family History No family history on file.  No Known Allergies   REVIEW OF SYSTEMS  (Negative unless checked)  Constitutional: [] Weight loss  [] Fever  [] Chills Cardiac: [] Chest pain   [] Chest pressure   [] Palpitations   [] Shortness of breath when laying flat   [] Shortness of breath with exertion. Vascular:  [x] Pain in legs with walking   [] Pain in legs at rest  [] History of DVT   [] Phlebitis   [] Swelling in legs   [] Varicose veins   [] Non-healing ulcers Pulmonary:   [] Uses home oxygen   [] Productive cough   [] Hemoptysis   [] Wheeze  [] COPD   [] Asthma Neurologic:  [] Dizziness   [] Seizures   [] History of stroke   [] History of TIA  [] Aphasia   [] Vissual changes   [] Weakness or numbness in arm   [] Weakness or numbness in leg Musculoskeletal:   [] Joint swelling   [] Joint pain   [] Low back pain Hematologic:  [] Easy bruising  [] Easy bleeding   [] Hypercoagulable state   [] Anemic Gastrointestinal:  [] Diarrhea   [] Vomiting  [] Gastroesophageal reflux/heartburn   [] Difficulty swallowing. Genitourinary:  [] Chronic kidney disease   [] Difficult urination  [] Frequent urination   [] Blood in urine Skin:  [] Rashes   [] Ulcers  Psychological:  [] History of  anxiety   []  History of major depression.  Physical Examination  There were no vitals filed for this visit. There is no height or weight on file to calculate BMI. Gen: WD/WN, NAD Head: Snead/AT, No temporalis wasting.  Ear/Nose/Throat: Hearing grossly intact, nares w/o erythema or drainage Eyes: PER, EOMI, sclera nonicteric.  Neck: Supple, no masses.  No bruit or JVD.  Pulmonary:  Good air movement, no audible wheezing, no use of accessory muscles.  Cardiac: RRR, normal S1, S2, no Murmurs. Vascular:  mild trophic changes, no open wounds Vessel Right Left  Radial Palpable Palpable  PT Not Palpable Not Palpable  DP Not Palpable Not Palpable  Gastrointestinal: soft, non-distended. No guarding/no peritoneal signs.  Musculoskeletal: M/S 5/5 throughout.  No visible deformity.  Neurologic: CN 2-12 intact. Pain and light touch intact in  extremities.  Symmetrical.  Speech is fluent. Motor exam as listed above. Psychiatric: Judgment intact, Mood & affect appropriate for pt's clinical situation. Dermatologic: No rashes or ulcers noted.  No changes consistent with cellulitis.   CBC Lab Results  Component Value Date   WBC 7.0 09/24/2022   HGB 18.4 (H) 09/24/2022   HCT 55.4 (H) 09/24/2022   MCV 100.7 (H) 09/24/2022   PLT 142 (L) 09/24/2022    BMET    Component Value Date/Time   NA 139 09/25/2022 0309   K 3.5 09/25/2022 0309   CL 108 09/25/2022 0309   CO2 23 09/25/2022 0309   GLUCOSE 104 (H) 09/25/2022 0309   BUN 24 (H) 09/25/2022 0309   CREATININE 1.02 09/25/2022 0309   CALCIUM 8.6 (L) 09/25/2022 0309   GFRNONAA >60 09/25/2022 0309   GFRAA >60 07/08/2019 1058   CrCl cannot be calculated (Patient's most recent lab result is older than the maximum 21 days allowed.).  COAG Lab Results  Component Value Date   INR 1.1 09/24/2022   INR 1.2 07/08/2019    Radiology CT Angio Abd/Pel w/ and/or w/o Result Date: 06/29/2023 CLINICAL DATA:  Abdominal aortic aneurysm (AAA), pre-op planning. Dx: Ruptured infrarenal abdominal aortic aneurysm EXAM: CTA ABDOMEN AND PELVIS WITHOUT AND WITH CONTRAST TECHNIQUE: Multidetector CT imaging of the abdomen and pelvis was performed using the standard protocol during bolus administration of intravenous contrast. Multiplanar reconstructed images and MIPs were obtained and reviewed to evaluate the vascular anatomy. RADIATION DOSE REDUCTION: This exam was performed according to the departmental dose-optimization program which includes automated exposure control, adjustment of the mA and/or kV according to patient size and/or use of iterative reconstruction technique. CONTRAST:  OMNIPAQUE  IOHEXOL  350 MG/ML SOLN COMPARISON:  Ultrasound aorta 08/25/2021, CT abdomen pelvis 08/28/2018 FINDINGS: VASCULAR Aorta: Interval increase in size of a 5.7 x 5.3 cm infrarenal abdominal aorta. Finding extends  approximately 6 cm in the craniocaudal dimension down to the aortic bifurcation. Question interval development of slight haziness of the aortic wall and the surrounding fat. Heterogeneous lumen suggestive of turbulent flow with no definite thrombus fissuration. No definite draped aorta sign. No dissection. Celiac: Mild to moderate narrowing of the origin of the celiac artery due to atherosclerotic plaque. Patent without evidence of aneurysm, dissection, vasculitis or significant stenosis. SMA: Mild narrowing of the superior mesenteric artery due to atherosclerotic plaque. Patent without evidence of aneurysm, dissection, vasculitis or significant stenosis. Renals: Both renal arteries are patent without evidence of aneurysm, dissection, vasculitis, fibromuscular dysplasia or significant stenosis. IMA: Patent without evidence of aneurysm, dissection, vasculitis or significant stenosis. Inflow: Patent without evidence of aneurysm, dissection, vasculitis or significant stenosis. Proximal Outflow:  Bilateral common iliac artery aneurysm measuring up to 2.1 cm on the right and 1.7 cm on the left. Veins: No obvious venous abnormality within the limitations of this arterial phase study. Review of the MIP images confirms the above findings. NON-VASCULAR Lower chest: Coronary artery calcification.  No acute abnormality. Hepatobiliary: No focal liver abnormality. No gallstones, gallbladder wall thickening, or pericholecystic fluid. No biliary dilatation. Pancreas: No focal lesion. Normal pancreatic contour. No surrounding inflammatory changes. No main pancreatic ductal dilatation. Spleen: Normal in size without focal abnormality. Adrenals/Urinary Tract: No adrenal nodule bilaterally. Bilateral kidneys enhance symmetrically. No hydronephrosis. No hydroureter. Fluid density lesions likely knee likely represent simple renal cysts. Simple renal cysts, in the absence of clinically indicated signs/symptoms, require no independent  follow-up. No nephroureterolithiasis bilaterally. The urinary bladder is unremarkable. Stomach/Bowel: Stomach is within normal limits. No evidence of bowel wall thickening or dilatation. Colonic diverticulosis. Appendix appears normal. Lymphatic: No lymphadenopathy. Reproductive: Prostate is unremarkable. Other: No intraperitoneal free fluid. No intraperitoneal free gas. No organized fluid collection. Musculoskeletal: Tiny fat containing umbilical hernia. No suspicious lytic or blastic osseous lesions. No acute displaced fracture. L5-S1 loss of intervertebral disc space with endplate sclerosis and posterior disc osteophyte complex formation. IMPRESSION: VASCULAR 1. Interval increase in size of a 5.7 x 5.3 cm (from 4.7 cm in 2023) infrarenal abdominal aorta (aneurysm 6 cm in the craniocaudal dimension down to the aortic bifurcation) with question development of slight haziness of the aortic wall and mild fat stranding of the surrounding fat. Recommend follow-up CT or MR as appropriate in 6 months and referral to or continued care with vascular specialist. (Ref.: J Vasc Surg. 2018; 67:2-77 and J Am Coll Radiol 2013;10(10):789-794.) 2. 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 NON-VASCULAR 1. Colonic diverticulosis with no acute diverticulitis. These results will be called to the ordering clinician or representative by the Radiologist Assistant, and communication documented in the PACS or [employer]. Electronically Signed   By: Morgane  Naveau M.D.   On: 06/29/2023 17:45   VAS US  AAA DUPLEX Result Date: 06/25/2023 ABDOMINAL AORTA STUDY Patient Name:  Samuel Boyd  Date of Exam:   06/22/2023 Medical Rec #: [id number]           Accession #:    [id number] Date of Birth: Dec 03, 1945          Patient Gender: M Patient Age:   57 years Exam Location:  [facility] Procedure:      VAS US  AAA DUPLEX Referring Phys: Sharla Davis  --------------------------------------------------------------------------------  Indications: Follow up exam for known AAA. Risk Factors: Hypertension, hyperlipidemia, current smoker, prior CVA. Limitations: Air/bowel gas.  Performing Technologist: Oneta Bilberry RVT  Examination Guidelines: A complete evaluation includes B-mode imaging, spectral Doppler, color Doppler, and power Doppler as needed of all accessible portions of each vessel. Bilateral testing is considered an integral part of a complete examination. Limited examinations for reoccurring indications may be performed as noted.  Abdominal Aorta Findings: +-----------+-------+----------+----------+--------+--------+--------+ Location   AP (cm)Trans (cm)PSV (cm/s)WaveformThrombusComments +-----------+-------+----------+----------+--------+--------+--------+ Proximal   2.96   3.23      72                                 +-----------+-------+----------+----------+--------+--------+--------+ Mid        3.07   3.22      45                                 +-----------+-------+----------+----------+--------+--------+--------+  Distal     5.56   5.52      26                                 +-----------+-------+----------+----------+--------+--------+--------+ RT CIA Prox2.4    2.2       74                                 +-----------+-------+----------+----------+--------+--------+--------+ LT CIA Prox2.1    2.0       28                                 +-----------+-------+----------+----------+--------+--------+--------+  Summary: Abdominal Aorta: There is evidence of abnormal dilatation of the distal Abdominal aorta. There is evidence of abnormal dilation of the Right Common Iliac artery and Left Common Iliac artery. The largest aortic measurement is 5.6 cm. The largest aortic diameter has increased compared to prior exam. Previous diameter measurement was 4.7 cm obtained on 06/22/2023.  *See table(s) above for  measurements and observations.  Electronically signed by Devon Fogo MD on 06/25/2023 at 8:42:45 AM.    Final      Assessment/Plan There are no diagnoses linked to this encounter.   Devon Fogo, MD  07/04/2023 7:55 AM

## 2023-07-05 ENCOUNTER — Encounter (INDEPENDENT_AMBULATORY_CARE_PROVIDER_SITE_OTHER): Payer: Self-pay | Admitting: Vascular Surgery

## 2023-07-05 ENCOUNTER — Ambulatory Visit (INDEPENDENT_AMBULATORY_CARE_PROVIDER_SITE_OTHER): Admitting: Vascular Surgery

## 2023-07-05 VITALS — BP 119/79 | HR 80 | Resp 18 | Ht 68.0 in | Wt 192.0 lb

## 2023-07-05 DIAGNOSIS — I480 Paroxysmal atrial fibrillation: Secondary | ICD-10-CM

## 2023-07-05 DIAGNOSIS — I7143 Infrarenal abdominal aortic aneurysm, without rupture: Secondary | ICD-10-CM

## 2023-07-05 DIAGNOSIS — I70213 Atherosclerosis of native arteries of extremities with intermittent claudication, bilateral legs: Secondary | ICD-10-CM

## 2023-07-05 DIAGNOSIS — E782 Mixed hyperlipidemia: Secondary | ICD-10-CM

## 2023-07-05 DIAGNOSIS — I10 Essential (primary) hypertension: Secondary | ICD-10-CM

## 2023-07-11 ENCOUNTER — Telehealth (INDEPENDENT_AMBULATORY_CARE_PROVIDER_SITE_OTHER): Payer: Self-pay

## 2023-07-11 NOTE — Telephone Encounter (Signed)
 Spoke with the patient to let him know he is scheduled with Dr. Prescilla Brod for a AAA stent graft repair on 07/25/23 at the [facility]. Pre-admission will call to scheduled pre-op at the [facility]. Pre-surgical instructions were discussed and will be sent to Mychart and mailed.

## 2023-07-16 ENCOUNTER — Other Ambulatory Visit (INDEPENDENT_AMBULATORY_CARE_PROVIDER_SITE_OTHER): Payer: Self-pay | Admitting: Nurse Practitioner

## 2023-07-16 ENCOUNTER — Encounter
Admission: RE | Admit: 2023-07-16 | Discharge: 2023-07-16 | Disposition: A | Discharge status: Home / Self Care | Source: Ambulatory Visit | Attending: Vascular Surgery | Admitting: Vascular Surgery

## 2023-07-16 ENCOUNTER — Other Ambulatory Visit: Payer: Self-pay

## 2023-07-16 VITALS — BP 129/94 | HR 85 | Temp 97.6°F | Resp 18 | Ht 68.0 in | Wt 191.0 lb

## 2023-07-16 DIAGNOSIS — R9431 Abnormal electrocardiogram [ECG] [EKG]: Secondary | ICD-10-CM | POA: Diagnosis not present

## 2023-07-16 DIAGNOSIS — I10 Essential (primary) hypertension: Secondary | ICD-10-CM | POA: Diagnosis not present

## 2023-07-16 DIAGNOSIS — I7143 Infrarenal abdominal aortic aneurysm, without rupture: Secondary | ICD-10-CM | POA: Diagnosis not present

## 2023-07-16 DIAGNOSIS — Z01818 Encounter for other preprocedural examination: Secondary | ICD-10-CM | POA: Insufficient documentation

## 2023-07-16 DIAGNOSIS — Z01812 Encounter for preprocedural laboratory examination: Secondary | ICD-10-CM | POA: Diagnosis present

## 2023-07-16 DIAGNOSIS — E119 Type 2 diabetes mellitus without complications: Secondary | ICD-10-CM | POA: Diagnosis not present

## 2023-07-16 DIAGNOSIS — Z01810 Encounter for preprocedural cardiovascular examination: Secondary | ICD-10-CM | POA: Diagnosis not present

## 2023-07-16 HISTORY — DX: Type 2 diabetes mellitus without complications: E11.9

## 2023-07-16 HISTORY — DX: Heart failure, unspecified: I50.9

## 2023-07-16 LAB — TYPE AND SCREEN
ABO/RH(D): A POS
Antibody Screen: NEGATIVE

## 2023-07-16 LAB — SURGICAL PCR SCREEN
MRSA, PCR: NEGATIVE
Staphylococcus aureus: NEGATIVE

## 2023-07-16 NOTE — Patient Instructions (Addendum)
 Your procedure is scheduled on: [facility] JUNE 18  Report to the Registration Desk on the 1st floor of the [facility] To find out your arrival time, please call [phone] between 1PM - 3PM on:  TUESDAY JUNE 17  If your arrival time is 6:00 am, do not arrive before that time as the [facility] entrance doors do not open until 6:00 am.  REMEMBER: Instructions that are not followed completely may result in serious medical risk, up to and including death; or upon the discretion of your surgeon and anesthesiologist your surgery may need to be rescheduled.  Do not eat food after midnight the night before surgery.  No gum chewing or hard candies.  One week prior to surgery: Stop Anti-inflammatories (NSAIDS) such as Advil, Aleve, Ibuprofen, Motrin, Naproxen, Naprosyn and Aspirin  based products such as Excedrin, Goody's Powder, BC Powder. Stop ANY OVER THE COUNTER supplements until after surgery. Cholecalciferol (VITAMIN D-3 PO)  Omega-3 Fatty Acids (FISH OIL)   You may however, continue to take Tylenol  if needed for pain up until the day of surgery.   **Follow recommendations regarding stopping blood thinners.** apixaban (ELIQUIS) hold 2 days prior to surgery, last dose SUNDAY JUNE 15   Continue taking all of your other prescription medications up until the day of surgery.  ON THE DAY OF SURGERY ONLY TAKE THESE MEDICATIONS WITH SIPS OF WATER:  metoprolol tartrate (LOPRESSOR)   No Alcohol for 24 hours before or after surgery.  No Smoking including e-cigarettes for 24 hours before surgery.  No chewable tobacco products for at least 6 hours before surgery.  No nicotine  patches on the day of surgery.  Do not use any "recreational" drugs for at least a week (preferably 2 weeks) before your surgery.  Please be advised that the combination of cocaine and anesthesia may have negative outcomes, up to and including death. If you test positive for cocaine, your surgery will be  cancelled.  On the morning of surgery brush your teeth with toothpaste and water, you may rinse your mouth with mouthwash if you wish. Do not swallow any toothpaste or mouthwash.  Use CHG Soap as directed on instruction sheet.  Do not wear jewelry, make-up, hairpins, clips or nail polish.  For welded (permanent) jewelry: bracelets, anklets, waist bands, etc.  Please have this removed prior to surgery.  If it is not removed, there is a chance that hospital personnel will need to cut it off on the day of surgery.  Do not wear lotions, powders, or perfumes.   Do not shave body hair from the neck down 48 hours before surgery.  Contact lenses, hearing aids and dentures may not be worn into surgery.  Do not bring valuables to the hospital. [facility] is not responsible for any missing/lost belongings or valuables.   Notify your doctor if there is any change in your medical condition (cold, fever, infection).  Wear comfortable clothing (specific to your surgery type) to the hospital.  After surgery, you can help prevent lung complications by doing breathing exercises.  Take deep breaths and cough every 1-2 hours. Your doctor may order a device called an Incentive Spirometer to help you take deep breaths.  If you are being admitted to the hospital overnight, leave your suitcase in the car. After surgery it may be brought to your room.  In case of increased patient census, it may be necessary for you, the patient, to continue your postoperative care in the Same Day Surgery department.  If  you are being discharged the day of surgery, you will not be allowed to drive home. You will need a responsible individual to drive you home and stay with you for 24 hours after surgery.   If you are taking public transportation, you will need to have a responsible individual with you.  Please call the Pre-admissions Testing Dept. at [phone] if you have any questions about these  instructions.  Surgery Visitation Policy:  Patients having surgery or a procedure may have two visitors.  Children under the age of 40 must have an adult with them who is not the patient.  Inpatient Visitation:    Visiting hours are 7 a.m. to 8 p.m. Up to four visitors are allowed at one time in a patient room. The visitors may rotate out with other people during the day.  One visitor age 28 or older may stay with the patient overnight and must be in the room by 8 p.m.        Preparing for Surgery with CHLORHEXIDINE  GLUCONATE (CHG) Soap  Chlorhexidine  Gluconate (CHG) Soap  o An antiseptic cleaner that kills germs and bonds with the skin to continue killing germs even after washing  o Used for showering the night before surgery and morning of surgery  Before surgery, you can play an important role by reducing the number of germs on your skin.  CHG (Chlorhexidine  gluconate) soap is an antiseptic cleanser which kills germs and bonds with the skin to continue killing germs even after washing.  Please do not use if you have an allergy to CHG or antibacterial soaps. If your skin becomes reddened/irritated stop using the CHG.  1. Shower the NIGHT BEFORE SURGERY and the MORNING OF SURGERY with CHG soap.  2. If you choose to wash your hair, wash your hair first as usual with your normal shampoo.  3. After shampooing, rinse your hair and body thoroughly to remove the shampoo.  4. Use CHG as you would any other liquid soap. You can apply CHG directly to the skin and wash gently with a scrungie or a clean washcloth.  5. Apply the CHG soap to your body only from the neck down. Do not use on open wounds or open sores. Avoid contact with your eyes, ears, mouth, and genitals (private parts). Wash face and genitals (private parts) with your normal soap.  6. Wash thoroughly, paying special attention to the area where your surgery will be performed.  7. Thoroughly rinse your body with warm  water.  8. Do not shower/wash with your normal soap after using and rinsing off the CHG soap.  9. Pat yourself dry with a clean towel.  10. Wear clean pajamas to bed the night before surgery.  11.. Place clean sheets on your bed the night of your first shower and do not sleep with pets.  12. Shower again with the CHG soap on the day of surgery prior to arriving at the hospital.  13. Do not apply any deodorants/lotions/powders.  14. Please wear clean clothes to the hospital.

## 2023-07-24 ENCOUNTER — Encounter: Payer: Self-pay | Admitting: Vascular Surgery

## 2023-07-24 NOTE — Progress Notes (Signed)
 Perioperative / Anesthesia Services  Pre-Admission Testing Clinical Review / Pre-Operative Anesthesia Consult  Date: 07/24/23  PATIENT DEMOGRAPHICS: Name: Samuel Boyd DOB: [birth year] MRN:   [id number]  Note: Available PAT nursing documentation and vital signs have been reviewed. Clinical nursing staff has updated patient's PMH/PSHx, current medication list, and drug allergies/intolerances to ensure complete and comprehensive history available to assist care teams in MDM as it pertains to the aforementioned surgical procedure and anticipated anesthetic course. Extensive review of available clinical information personally performed. [place] PMH and PSHx updated with any diagnoses/procedures that  may have been inadvertently omitted during his intake with the pre-admission testing department's nursing staff.  PLANNED SURGICAL PROCEDURE(S):   Case: [id number] Date/Time: 07/25/23 0800   Procedure: ENDOVASCULAR REPAIR/STENT GRAFT   Anesthesia type: General   Diagnosis: Abdominal aortic aneurysm (AAA) without rupture, unspecified part (HCC) [I71.40]   Pre-op diagnosis: AAA   GORE  ANESTHESIA     AAA repair   Location: [facility] / [facility]   Providers: Prescilla Brod Ninette Basque, MD        CLINICAL DISCUSSION: Samuel Boyd is a 78 y.o. male who is submitted for pre-surgical anesthesia review and clearance prior to him undergoing the above procedure. Patient is a Current Smoker. Pertinent PMH includes: secondary cardiomyopathy, HFrEF, persistent atrial fibrillation, valvular heart disease, multiple CVAs, chronic cerebral microvascular disease, PVD, aortic root dilatation, AAA, BILATERAL common iliac artery aneurysms, aortic atherosclerosis, HTN, HLD, T2DM, nephrolithiasis, secondary erythrocytosis, OA, lumbosacral DDD.  Patient is followed by cardiology Braxton Calico, MD). He was last seen in the cardiology clinic on 07/06/2023; notes reviewed. At the time of his clinic visit,  patient doing well overall from a cardiovascular perspective. Patient denied any chest pain, shortness of breath, PND, orthopnea, palpitations, significant peripheral edema, weakness, fatigue, vertiginous symptoms, or presyncope/syncope. Patient with a past medical history significant for cardiovascular diagnoses. Documented physical exam was grossly benign, providing no evidence of acute exacerbation and/or decompensation of the patient's known cardiovascular conditions.   Patient has a history of multiple CVAs in the past.    MRI imaging of the brain performed on 07/08/2019 revealed patchy cortical/subcortical acute/early subacute infarcts within the paramedian posterior RIGHT frontal lobe.  MRA imaging of the head and neck revealed severe distal RIGHT ACA stenosis.  MRI imaging of the brain performed on 07/08/2019 revealed chronic LEFT parietal lobe infarct, in addition to chronic lacunar infarcts within the LEFT basal ganglia, left thalamus, brainstem, and cerebellum.  MRI imaging of the brain performed on 09/24/2022 revealed an acute infarct in the posterior limb of the RIGHT internal capsule.  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  Additionally, there was severe stenosis of the LEFT vertebral artery V4 segment, multifocal stenosis of the RIGHT ACA A2/A3 segments, and stenosis of the proximal basilar artery.  Most recent myocardial perfusion imaging study was performed on 07/29/2021 demonstrated a severely reduced left ventricular systolic function with an EF of 25%.  There was global hypokinesis of all of the myocardial walls.  There was severe dilation of the left ventricle.  SPECT images demonstrated no evidence of stress-induced myocardial ischemia or arrhythmia; no scintigraphic evidence of scar.  TID ratio = 1.06.  Indeterminate treadmill ECG due to baseline ECG changes and atrial fibrillation.  Overall, study demonstrated normal  myocardial perfusion without evidence of myocardial ischemia.  Patient with a known history of an enlarging infrarenal abdominal aortic aneurysm.  Most  recent CT imaging was performed on 06/29/2023 at which time the aneurysmal defect measured 5.7 x 5.3 cm extending approximately 6 cm in the craniocaudal dimension down to the aortic bifurcation.  There was no definite thrombus evisceration.  Additionally, there were BILATERAL common iliac artery aneurysms measuring up to 2.1 cm on the RIGHT and 1.7 cm on the LEFT.  Patient with an atrial fibrillation diagnosis; CHA2DS2-VASc Score = 8 (age x 2, HFrEF, HTN, CVA x 2, vascular disease, T2DM). His rate and rhythm are currently being maintained on oral metoprolol tartrate. He is chronically anticoagulated using apixaban and; reported to be compliant with therapy with no evidence or reports of GI/GU bleeding.  Blood pressure well controlled at 115/80 mmHg on currently prescribed diuretic (furosemide), ACEi (lisinopril), and beta-blocker (metoprolol tartrate therapies. He is on rosuvastatin + omega-3 fatty acid for his HLD diagnosis and further ASCVD prevention. T2DM well controlled on currently prescribed regimen; last HgbA1c was 6.3% when checked on 06/22/2023.  Functional capacity somewhat limited by patient's age, multiple medical comorbidities, and orthopedic pain.  With that said, patient able to complete all of his ADLs/IADLs without significant cardiovascular limitation.  Per the DASI, patient is able to exceed 4 METS of physical activity without experiencing any significant degree of angina/anginal equivalent symptoms.  No changes were made to his medication regimen.  Ahead of upcoming vascular surgery, patient was scheduled for repeat echocardiogram for risk stratification.  Patient to follow-up with outpatient cardiology and 2 weeks or sooner if needed.  Since patient was last seen by cardiology, patient has undergone the recommended noninvasive  cardiovascular testing.  Most recent TTE performed on 07/06/2023 revealed a moderately reduced left ventricular systolic function with an EF of 30%. There was global hypokinesis.  Left ventricular diastolic Doppler parameters indeterminate.  Right ventricular size and function normal with a TAPSE measuring 1.6 cm  (normal range >/= 1.6 cm).  Biatrial enlargement was noted; severe on the LEFT.  There was trivial pulmonary and tricuspid valvular agitation.  Cannot all transvalvular gradients were noted to be normal providing no evidence of hemodynamically significant valvular stenosis. Aorta normal in size with no evidence of ectasia or aneurysmal dilatation.  Roselinda Congress is scheduled for ENDOVASCULAR REPAIR/STENT GRAFT (AAA) on 07/25/2023 with Dr. Devon Fogo, MD.  Given patient's past medical history significant for cardiovascular diagnoses, presurgical cardiac clearance was sought by the PAT team. Per cardiology, this patient is optimized for surgery and may proceed with the planned procedural course with a MODERATE risk of significant perioperative cardiovascular complications.  Again, this patient is on daily oral anticoagulation therapy using a DOAC medication.  He has been instructed on recommendations for holding his apixaban for 2 days prior to his procedure with plans to restart as soon as postoperative bleeding risk felt to be minimized by his primary attending surgeon. The patient has been instructed that his last dose of should be on 07/22/2023. Given that patient's past medical history is significant for cardiovascular diagnoses, including but not limited to CAD, vascular surgery has cleared patient to continue his daily low dose ASA throughout his perioperative course. He will be asked to hold his normal dose on the day of his procedure only. Patient has been updated on these directives from his specialty care providers by the PAT team.  Patient denies previous perioperative  complications with anesthesia in the past. In review his EMR, it is noted that patient underwent a general anesthetic course here at [facility] in 09/2018 without documented complications.   MOST RECENT VITAL SIGNS:    07/16/2023   11:32 AM 07/16/2023   11:04 AM 07/05/2023    8:48 AM  Vitals with BMI  Height  5' 8 5' 8  Weight  191 lbs 192 lbs  BMI  29.05 29.2  Systolic 129 135 295  Diastolic 94 108 79  Pulse  85 80   PROVIDERS/SPECIALISTS: NOTE: Primary physician provider listed below. Patient may have been seen by APP or partner within same practice.   PROVIDER ROLE / SPECIALTY LAST OV  Schnier, Ninette Basque, MD Vascular Surgery (Surgeon) 07/05/2023  Monique Ano, MD Primary Care Provider 06/29/2023  Isabell Manzanilla, DO Cardiology 07/06/2023  Devora Folks, MD Neurology 11/23/2022   ALLERGIES: No Known Allergies  CURRENT HOME MEDICATIONS: No current facility-administered medications for this encounter.    apixaban (ELIQUIS) 5 MG TABS tablet   aspirin  EC 81 MG tablet   Cholecalciferol (VITAMIN D-3 PO)   furosemide (LASIX) 20 MG tablet   lisinopril (ZESTRIL) 5 MG tablet   loratadine (CLARITIN) 10 MG tablet   metoprolol tartrate (LOPRESSOR) 50 MG tablet   Omega-3 Fatty Acids (FISH OIL) 1200 MG CAPS   rosuvastatin (CRESTOR) 40 MG tablet   HISTORY: Past Medical History:  Diagnosis Date   Abdominal aortic aneurysm (AAA) without rupture (HCC) 06/11/2018   Aneurysm of common iliac artery (BILATERAL) 06/29/2023   a.) CTA abd/pelvis 06/29/2023: measuring up to 2.1 cm (RIGHT) and 1.7 cm  (LEFT)   Aortic atherosclerosis (HCC)    Aortic root dilatation (HCC)    Arthritis    Atherosclerosis of native arteries of extremity with intermittent claudication (HCC) 03/05/2022   Cerebral microvascular disease    DDD (degenerative disc disease), lumbosacral    Diverticulosis    HFrEF (heart failure with reduced ejection fraction) (HCC)     History of 2019 novel coronavirus disease (COVID-19) 01/2021   History of kidney stones    Hyperlipemia, mixed 09/11/2018   Hypertension    Ischemic stroke (HCC) 07/08/2019   a.) MRI brain 07/08/2019: patchy cortical/subcortical acute/early subacute infarcts within the paramedian posterior RIGHT frontal lobe   Ischemic stroke (HCC) 09/24/2022   a.) MRI brain 09/24/2022: acute infarct in the posterior limb of the RIGHT internal capsule   Long-term use of aspirin  therapy    On apixaban therapy    Persistent atrial fibrillation (HCC)    a.) CHA2DS2VASc = 8 (age x2, HFrEF, HTN, CVA x 2,vascular disease, T2DM) as of 07/24/2023; b.) rate/rhythm maintained on oral metoprolol tartrate; chronically anticoagulated with apixaban   Pneumonia    Secondary cardiomyopathy (HCC)    Secondary erythrocytosis 09/24/2022   a.) smoking, overweight   Status post left inguinal hernia repair    Stroke [facility])    a.) noted on brain MRI 07/08/2019: chronic LEFT parietal lobe infarct; chronic lacunar infarcts within the LEFT basal ganglia, LEFT thalamus, brainstem and cerebellum   T2DM (type 2 diabetes mellitus) (HCC)    Tobacco dependence 09/11/2018   Valvular heart disease    Vitamin D deficiency    Past Surgical History:  Procedure Laterality Date   CYSTOSCOPY/URETEROSCOPY/HOLMIUM LASER/STENT PLACEMENT Left 09/16/2018   Procedure: CYSTOSCOPY/URETEROSCOPY/HOLMIUM LASER/STENT PLACEMENT, LEFT;  Surgeon: Dustin Gimenez, MD;  Location: [facility];  Service: Urology;  Laterality: Left;   INGUINAL HERNIA REPAIR Left 09/16/2018   Procedure: HERNIA REPAIR INGUINAL ADULT;  Surgeon: Eldred Grego, MD;  Location: [facility];  Service: General;  Laterality: Left;   No family history on file.  Social History   Tobacco Use   Smoking status: Some Days    Current packs/day: 0.50    Types: Cigarettes   Smokeless tobacco: Never  Substance Use Topics   Alcohol use: Never   LABS:  Lab Results  Component Value Date    WBC 7.0 09/24/2022   HGB 18.4 (H) 09/24/2022   HCT 55.4 (H) 09/24/2022   MCV 100.7 (H) 09/24/2022   PLT 142 (L) 09/24/2022   Lab Results  Component Value Date   NA 139 09/25/2022   CL 108 09/25/2022   K 3.5 09/25/2022   CO2 23 09/25/2022   BUN 24 (H) 09/25/2022   CREATININE 1.02 09/25/2022   GFRNONAA >60 09/25/2022   CALCIUM 8.6 (L) 09/25/2022   ALBUMIN 4.0 09/24/2022   GLUCOSE 104 (H) 09/25/2022   Hospital Outpatient Visit on 07/16/2023  Component Date Value Ref Range Status   ABO/RH(D) 07/16/2023 A POS   Final   Antibody Screen 07/16/2023 NEG   Final   Sample Expiration 07/16/2023 07/30/2023,2359   Final   Extend sample reason 07/16/2023    Final                   Value:NO TRANSFUSIONS OR PREGNANCY IN THE PAST 3 MONTHS Performed at [facility], [address] [place], [place] [address]    MRSA, PCR 07/16/2023 NEGATIVE  NEGATIVE Final   Staphylococcus aureus 07/16/2023 NEGATIVE  NEGATIVE Final   Comment: (NOTE) The Xpert SA Assay (FDA approved for NASAL specimens in patients 59 years of age and older), is one component of a comprehensive surveillance program. It is not intended to diagnose infection nor to guide or monitor treatment. Performed at [facility], [address] [place], [place] [address]     ECG: Date: 07/16/2023  Time ECG obtained: 1140 AM Rate: 79 bpm Rhythm: atrial fibrillation Axis (leads I and aVF): left Intervals: QRS 86 ms. QTc 440 ms. ST segment and T wave changes: No evidence of acute T wave abnormalities or significant ST segment elevation or depression.  Evidence of a possible, age undetermined, prior infarct:  Yes; anteroseptal Comparison: Similar to previous tracing obtained on 09/24/2022   IMAGING / PROCEDURES: CT ANGIO ABD/PEL W/ AND/OR W/O performed on 06/29/2023 Interval increase in size of a 5.7 x 5.3 cm (from 4.7 cm in 2023) infrarenal abdominal aorta (aneurysm 6 cm in the craniocaudal dimension down to the  aortic bifurcation) with question development of slight haziness of the aortic wall and mild fat stranding of the surrounding fat. Recommend follow-up CT or MR as appropriate in 6 months and referral to or continued care with vascular specialist. Colonic diverticulosis with no acute diverticulitis.  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  VAS US  AAA DUPLEX performed on 06/22/2023 There is evidence of abnormal dilatation of the distal Abdominal aorta.  There is evidence of abnormal dilation of the Right Common Iliac artery and Left Common Iliac artery.  The largest aortic measurement is 5.6 cm.  The largest aortic diameter has increased compared to prior exam.  Previous diameter measurement was 4.7 cm obtained on 06/22/2023.   TRANSTHORACIC ECHOCARDIOGRAM performed on 09/25/2022 Left ventricular ejection fraction, by estimation, is 25 to 30%. The left ventricle has severely decreased function. The left ventricle demonstrates global hypokinesis. The left ventricular internal cavity size was mildly dilated. Left ventricular diastolic parameters are indeterminate.  Right ventricular systolic function is normal. The  right ventricular size is normal. Tricuspid regurgitation signal is inadequate for assessing PA pressure. Left atrial size was mildly dilated.  Right atrial size was mildly dilated.  The mitral valve is normal in structure. Mild mitral valve regurgitation. No evidence of mitral stenosis.  The aortic valve is normal in structure. Aortic valve regurgitation is not visualized. Aortic valve sclerosis/calcification is present, without \\any  evidence of aortic stenosis.  Aortic dilatation noted. There is mild dilatation of the aortic root, measuring 44 mm.  The inferior vena cava is normal in size with greater than 50% respiratory variability, suggesting right atrial pressure of 3 mmHg.  Agitated saline contrast bubble study was  negative, with no evidence of any interatrial shunt.   MR BRAIN WO CONTRAST performed on 09/24/2022 Acute infarct in the posterior limb of the right internal capsule. No acute hemorrhage or significant mass effect. Old cortical infarcts along the posterior aspect of the right superior frontal gyrus and the left inferior parietal lobe. Old lacunar infarcts in the bilateral thalami and cerebellar hemispheres.  CT ANGIO HEAD NECK W WO CM W PERF performed on 09/24/2022 No acute large vessel occlusion. 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 Unchanged severe, multifocal stenosis of the right ACA A2/A3 segments. 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 No hemodynamically significant stenosis of the carotid arteries. Approximately 10 mL of ischemia in the brainstem and left MCA territory by CT perfusion. Aortic atherosclerosis  IMPRESSION AND PLAN: Samuel Boyd has been referred for pre-anesthesia review and clearance prior to him undergoing the planned anesthetic and procedural courses. Available labs, pertinent testing, and imaging results were personally reviewed by me in preparation for upcoming operative/procedural course. [facility] medical record has been updated following extensive record review and patient interview with PAT staff.   This patient has been appropriately cleared by cardiology with an overall MODERATE risk of patient experiencing significant perioperative cardiovascular complications. Based on clinical review performed today (07/24/23), barring any significant acute changes in the patient's overall condition, it is anticipated that he will be able to proceed with the planned surgical intervention. Any acute changes in clinical condition may necessitate his procedure being postponed and/or cancelled. Patient will meet with anesthesia team (MD and/or CRNA) on  the day of his procedure for preoperative evaluation/assessment. Questions regarding anesthetic course will be fielded at that time.   Pre-surgical instructions were reviewed with the patient during his PAT appointment, and questions were fielded to satisfaction by PAT clinical staff. He has been instructed on which medications that he will need to hold prior to surgery, as well as the ones that have been deemed safe/appropriate to take on the day of his procedure. As part of the general education provided by PAT, patient made aware both verbally and in writing, that he would need to abstain from the use of any illegal substances during his perioperative course. He was advised that failure to follow the provided instructions could necessitate case cancellation or result in serious perioperative complications up to and including death. Patient encouraged to contact PAT and/or his surgeon's office to discuss any questions or concerns that may arise prior to surgery; verbalized understanding.   Renate Caroline, MSN, APRN, FNP-C, CEN [facility]  Perioperative Services Nurse Practitioner Phone: [phone] Fax: [phone] 07/24/23 1:53 PM  NOTE: This note has been prepared using Dragon dictation software. Despite my best ability to proofread, there  is always the potential that unintentional transcriptional errors may still occur from this process.

## 2023-07-25 ENCOUNTER — Inpatient Hospital Stay: Payer: Self-pay | Admitting: Urgent Care

## 2023-07-25 ENCOUNTER — Encounter
Admission: RE | Disposition: A | Discharge status: Home / Self Care | Payer: Self-pay | Source: Home / Self Care | Attending: Vascular Surgery

## 2023-07-25 ENCOUNTER — Encounter: Payer: Self-pay | Admitting: Vascular Surgery

## 2023-07-25 ENCOUNTER — Inpatient Hospital Stay
Admission: RE | Admit: 2023-07-25 | Discharge: 2023-07-26 | DRG: 269 | Disposition: A | Discharge status: Home / Self Care | Attending: Vascular Surgery | Admitting: Vascular Surgery

## 2023-07-25 ENCOUNTER — Inpatient Hospital Stay: Admitting: Urgent Care

## 2023-07-25 DIAGNOSIS — I70213 Atherosclerosis of native arteries of extremities with intermittent claudication, bilateral legs: Secondary | ICD-10-CM | POA: Diagnosis present

## 2023-07-25 DIAGNOSIS — Z8673 Personal history of transient ischemic attack (TIA), and cerebral infarction without residual deficits: Secondary | ICD-10-CM

## 2023-07-25 DIAGNOSIS — E119 Type 2 diabetes mellitus without complications: Secondary | ICD-10-CM

## 2023-07-25 DIAGNOSIS — Z87442 Personal history of urinary calculi: Secondary | ICD-10-CM

## 2023-07-25 DIAGNOSIS — I429 Cardiomyopathy, unspecified: Secondary | ICD-10-CM | POA: Diagnosis present

## 2023-07-25 DIAGNOSIS — F17210 Nicotine dependence, cigarettes, uncomplicated: Secondary | ICD-10-CM | POA: Diagnosis present

## 2023-07-25 DIAGNOSIS — I771 Stricture of artery: Secondary | ICD-10-CM | POA: Diagnosis not present

## 2023-07-25 DIAGNOSIS — I7143 Infrarenal abdominal aortic aneurysm, without rupture: Secondary | ICD-10-CM | POA: Diagnosis present

## 2023-07-25 DIAGNOSIS — Z7982 Long term (current) use of aspirin: Secondary | ICD-10-CM | POA: Diagnosis not present

## 2023-07-25 DIAGNOSIS — I5022 Chronic systolic (congestive) heart failure: Secondary | ICD-10-CM | POA: Diagnosis present

## 2023-07-25 DIAGNOSIS — Z8616 Personal history of COVID-19: Secondary | ICD-10-CM | POA: Diagnosis not present

## 2023-07-25 DIAGNOSIS — I4819 Other persistent atrial fibrillation: Secondary | ICD-10-CM | POA: Diagnosis present

## 2023-07-25 DIAGNOSIS — Z7901 Long term (current) use of anticoagulants: Secondary | ICD-10-CM | POA: Diagnosis not present

## 2023-07-25 DIAGNOSIS — I700 Atherosclerosis of aorta: Secondary | ICD-10-CM | POA: Diagnosis present

## 2023-07-25 DIAGNOSIS — E782 Mixed hyperlipidemia: Secondary | ICD-10-CM | POA: Diagnosis present

## 2023-07-25 DIAGNOSIS — D751 Secondary polycythemia: Secondary | ICD-10-CM | POA: Diagnosis present

## 2023-07-25 DIAGNOSIS — I110 Hypertensive heart disease with heart failure: Secondary | ICD-10-CM | POA: Diagnosis present

## 2023-07-25 DIAGNOSIS — I714 Abdominal aortic aneurysm, without rupture, unspecified: Secondary | ICD-10-CM | POA: Diagnosis present

## 2023-07-25 DIAGNOSIS — I10 Essential (primary) hypertension: Secondary | ICD-10-CM

## 2023-07-25 DIAGNOSIS — E1151 Type 2 diabetes mellitus with diabetic peripheral angiopathy without gangrene: Secondary | ICD-10-CM | POA: Diagnosis present

## 2023-07-25 DIAGNOSIS — E663 Overweight: Secondary | ICD-10-CM | POA: Diagnosis present

## 2023-07-25 DIAGNOSIS — Z01812 Encounter for preprocedural laboratory examination: Secondary | ICD-10-CM

## 2023-07-25 HISTORY — DX: Vitamin D deficiency, unspecified: E55.9

## 2023-07-25 HISTORY — DX: Atherosclerosis of aorta: I70.0

## 2023-07-25 HISTORY — DX: Other persistent atrial fibrillation: I48.19

## 2023-07-25 HISTORY — DX: Endocarditis, valve unspecified: I38

## 2023-07-25 HISTORY — DX: Cardiomyopathy, unspecified: I42.9

## 2023-07-25 HISTORY — DX: Long term (current) use of aspirin: Z79.82

## 2023-07-25 HISTORY — DX: Long term (current) use of anticoagulants: Z79.01

## 2023-07-25 HISTORY — DX: Other intervertebral disc degeneration, lumbosacral region without mention of lumbar back pain or lower extremity pain: M51.379

## 2023-07-25 HISTORY — DX: Other cerebrovascular disease: I67.89

## 2023-07-25 HISTORY — PX: ENDOVASCULAR REPAIR/STENT GRAFT: CATH118280

## 2023-07-25 HISTORY — DX: Diverticulosis of intestine, part unspecified, without perforation or abscess without bleeding: K57.90

## 2023-07-25 HISTORY — DX: Unspecified systolic (congestive) heart failure: I50.20

## 2023-07-25 HISTORY — DX: Personal history of other diseases of the digestive system: Z87.19

## 2023-07-25 HISTORY — DX: Type 2 diabetes mellitus without complications: E11.9

## 2023-07-25 HISTORY — DX: Thoracic aortic ectasia: I77.810

## 2023-07-25 LAB — GLUCOSE, CAPILLARY
Glucose-Capillary: 114 mg/dL — ABNORMAL HIGH (ref 70–99)
Glucose-Capillary: 136 mg/dL — ABNORMAL HIGH (ref 70–99)
Glucose-Capillary: 116 mg/dL — ABNORMAL HIGH (ref 70–99)

## 2023-07-25 LAB — MRSA NEXT GEN BY PCR, NASAL: MRSA by PCR Next Gen: NOT DETECTED

## 2023-07-25 LAB — ABO/RH: ABO/RH(D): A POS

## 2023-07-25 SURGERY — ENDOVASCULAR STENT GRAFT (AAA)
Anesthesia: General

## 2023-07-25 MED ORDER — FENTANYL CITRATE (PF) 100 MCG/2ML IJ SOLN
INTRAMUSCULAR | Status: DC | PRN
  Administered 2023-07-25: 25 ug via INTRAVENOUS
  Administered 2023-07-25: 50 ug via INTRAVENOUS
  Administered 2023-07-25: 25 ug via INTRAVENOUS

## 2023-07-25 MED ORDER — SODIUM CHLORIDE 0.9 % IV SOLN: INTRAVENOUS | Status: DC

## 2023-07-25 MED ORDER — ASPIRIN 81 MG PO TBEC
81.0000 mg | DELAYED_RELEASE_TABLET | Freq: Every day | ORAL | Status: DC
  Filled 2023-07-25: qty 1

## 2023-07-25 MED ORDER — HEPARIN SODIUM (PORCINE) 1000 UNIT/ML IJ SOLN
INTRAMUSCULAR | Status: DC | PRN
  Administered 2023-07-25: 7000 [IU] via INTRAVENOUS

## 2023-07-25 MED ORDER — HEPARIN (PORCINE) IN NACL 1000-0.9 UT/500ML-% IV SOLN
INTRAVENOUS | Status: DC | PRN
  Administered 2023-07-25: 1500 mL

## 2023-07-25 MED ORDER — DEXAMETHASONE SODIUM PHOSPHATE 10 MG/ML IJ SOLN
INTRAMUSCULAR | Status: DC | PRN
  Administered 2023-07-25: 5 mg via INTRAVENOUS

## 2023-07-25 MED ORDER — MORPHINE SULFATE (PF) 2 MG/ML IV SOLN: 2.0000 mg | INTRAVENOUS | Status: DC | PRN

## 2023-07-25 MED ORDER — OXYCODONE-ACETAMINOPHEN 5-325 MG PO TABS
1.0000 | ORAL_TABLET | ORAL | Status: DC | PRN
  Filled 2023-07-25: qty 2

## 2023-07-25 MED ORDER — METOPROLOL TARTRATE 5 MG/5ML IV SOLN: 2.0000 mg | INTRAVENOUS | Status: DC | PRN

## 2023-07-25 MED ORDER — POTASSIUM CHLORIDE CRYS ER 20 MEQ PO TBCR: 20.0000 meq | EXTENDED_RELEASE_TABLET | Freq: Every day | ORAL | Status: DC | PRN

## 2023-07-25 MED ORDER — LISINOPRIL 5 MG PO TABS
10.0000 mg | ORAL_TABLET | Freq: Every evening | ORAL | Status: DC
  Administered 2023-07-25: 10 mg via ORAL
  Filled 2023-07-25: qty 2

## 2023-07-25 MED ORDER — ONDANSETRON HCL 4 MG/2ML IJ SOLN
INTRAMUSCULAR | Status: AC
Start: 2023-07-25 — End: 2023-07-25
  Filled 2023-07-25: qty 2

## 2023-07-25 MED ORDER — ESMOLOL HCL 100 MG/10ML IV SOLN
INTRAVENOUS | Status: DC | PRN
Start: 2023-07-25 — End: 2023-07-25
  Administered 2023-07-25 (×2): 10 mg via INTRAVENOUS

## 2023-07-25 MED ORDER — ROCURONIUM BROMIDE 100 MG/10ML IV SOLN
INTRAVENOUS | Status: DC | PRN
  Administered 2023-07-25: 30 mg via INTRAVENOUS
  Administered 2023-07-25: 10 mg via INTRAVENOUS
  Administered 2023-07-25: 40 mg via INTRAVENOUS
  Administered 2023-07-25: 20 mg via INTRAVENOUS

## 2023-07-25 MED ORDER — CEFAZOLIN SODIUM-DEXTROSE 2-4 GM/100ML-% IV SOLN
2.0000 g | Freq: Three times a day (TID) | INTRAVENOUS | Status: AC
  Administered 2023-07-25 (×2): 2 g via INTRAVENOUS
  Filled 2023-07-25 (×2): qty 100

## 2023-07-25 MED ORDER — HEPARIN SODIUM (PORCINE) 1000 UNIT/ML IJ SOLN
INTRAMUSCULAR | Status: AC
  Filled 2023-07-25: qty 10

## 2023-07-25 MED ORDER — LIDOCAINE HCL (CARDIAC) PF 100 MG/5ML IV SOSY
PREFILLED_SYRINGE | INTRAVENOUS | Status: DC | PRN
  Administered 2023-07-25: 80 mg via INTRAVENOUS

## 2023-07-25 MED ORDER — FENTANYL CITRATE (PF) 100 MCG/2ML IJ SOLN: 25.0000 ug | INTRAMUSCULAR | Status: DC | PRN

## 2023-07-25 MED ORDER — OXYCODONE HCL 5 MG/5ML PO SOLN
5.0000 mg | Freq: Once | ORAL | Status: AC | PRN
  Filled 2023-07-25: qty 5

## 2023-07-25 MED ORDER — CHLORHEXIDINE GLUCONATE CLOTH 2 % EX PADS
6.0000 | MEDICATED_PAD | Freq: Every day | CUTANEOUS | Status: DC
  Administered 2023-07-25: 6 via TOPICAL

## 2023-07-25 MED ORDER — CHLORHEXIDINE GLUCONATE CLOTH 2 % EX PADS
6.0000 | MEDICATED_PAD | Freq: Once | CUTANEOUS | Status: AC
Start: 2023-07-25 — End: 2023-07-25
  Administered 2023-07-25: 6 via TOPICAL

## 2023-07-25 MED ORDER — LORATADINE 10 MG PO TABS
10.0000 mg | ORAL_TABLET | Freq: Every day | ORAL | Status: DC
  Filled 2023-07-25: qty 1

## 2023-07-25 MED ORDER — DOPAMINE-DEXTROSE 3.2-5 MG/ML-% IV SOLN: 3.0000 ug/kg/min | INTRAVENOUS | Status: DC

## 2023-07-25 MED ORDER — SUGAMMADEX SODIUM 200 MG/2ML IV SOLN
INTRAVENOUS | Status: DC | PRN
  Administered 2023-07-25 (×2): 100 mg via INTRAVENOUS

## 2023-07-25 MED ORDER — HEPARIN (PORCINE) IN NACL 2000-0.9 UNIT/L-% IV SOLN
INTRAVENOUS | Status: DC | PRN
  Administered 2023-07-25: 1000 mL

## 2023-07-25 MED ORDER — FENTANYL CITRATE (PF) 100 MCG/2ML IJ SOLN
INTRAMUSCULAR | Status: AC
Start: 2023-07-25 — End: 2023-07-25
  Filled 2023-07-25: qty 2

## 2023-07-25 MED ORDER — PROPOFOL 10 MG/ML IV BOLUS
INTRAVENOUS | Status: DC | PRN
  Administered 2023-07-25: 120 mg via INTRAVENOUS

## 2023-07-25 MED ORDER — PHENYLEPHRINE HCL-NACL 20-0.9 MG/250ML-% IV SOLN
INTRAVENOUS | Status: DC | PRN
  Administered 2023-07-25: 30 ug/min via INTRAVENOUS
  Administered 2023-07-25 (×2): 80 ug via INTRAVENOUS

## 2023-07-25 MED ORDER — LABETALOL HCL 5 MG/ML IV SOLN: 10.0000 mg | INTRAVENOUS | Status: DC | PRN

## 2023-07-25 MED ORDER — CEFAZOLIN SODIUM-DEXTROSE 2-4 GM/100ML-% IV SOLN
2.0000 g | INTRAVENOUS | Status: AC
  Administered 2023-07-25: 2 g via INTRAVENOUS

## 2023-07-25 MED ORDER — ROSUVASTATIN CALCIUM 20 MG PO TABS
40.0000 mg | ORAL_TABLET | Freq: Every day | ORAL | Status: DC
  Administered 2023-07-25: 40 mg via ORAL
  Filled 2023-07-25 (×2): qty 2

## 2023-07-25 MED ORDER — ESMOLOL HCL 100 MG/10ML IV SOLN
INTRAVENOUS | Status: AC
  Filled 2023-07-25: qty 10

## 2023-07-25 MED ORDER — NITROGLYCERIN IN D5W 200-5 MCG/ML-% IV SOLN: 5.0000 ug/min | INTRAVENOUS | Status: DC

## 2023-07-25 MED ORDER — ONDANSETRON HCL 4 MG/2ML IJ SOLN
4.0000 mg | Freq: Four times a day (QID) | INTRAMUSCULAR | Status: DC | PRN
  Administered 2023-07-25: 4 mg via INTRAVENOUS

## 2023-07-25 MED ORDER — ONDANSETRON HCL 4 MG/2ML IJ SOLN: 4.0000 mg | Freq: Four times a day (QID) | INTRAMUSCULAR | Status: DC | PRN

## 2023-07-25 MED ORDER — ALUM & MAG HYDROXIDE-SIMETH 200-200-20 MG/5ML PO SUSP: 15.0000 mL | ORAL | Status: DC | PRN

## 2023-07-25 MED ORDER — ACETAMINOPHEN 650 MG RE SUPP: 325.0000 mg | RECTAL | Status: DC | PRN

## 2023-07-25 MED ORDER — DOCUSATE SODIUM 100 MG PO CAPS
100.0000 mg | ORAL_CAPSULE | Freq: Every day | ORAL | Status: DC
  Filled 2023-07-25: qty 1

## 2023-07-25 MED ORDER — CHLORHEXIDINE GLUCONATE 0.12 % MT SOLN
15.0000 mL | Freq: Once | OROMUCOSAL | Status: AC
  Administered 2023-07-25: 15 mL via OROMUCOSAL
  Filled 2023-07-25: qty 15

## 2023-07-25 MED ORDER — ORAL CARE MOUTH RINSE: 15.0000 mL | Freq: Once | OROMUCOSAL | Status: AC

## 2023-07-25 MED ORDER — OXYCODONE HCL 5 MG PO TABS
ORAL_TABLET | ORAL | Status: AC
  Filled 2023-07-25: qty 1

## 2023-07-25 MED ORDER — DEXAMETHASONE SODIUM PHOSPHATE 10 MG/ML IJ SOLN
INTRAMUSCULAR | Status: AC
  Filled 2023-07-25: qty 1

## 2023-07-25 MED ORDER — SODIUM CHLORIDE 0.9 % IV SOLN: 500.0000 mL | Freq: Once | INTRAVENOUS | Status: DC | PRN

## 2023-07-25 MED ORDER — MAGNESIUM SULFATE 2 GM/50ML IV SOLN: 2.0000 g | Freq: Every day | INTRAVENOUS | Status: DC | PRN

## 2023-07-25 MED ORDER — ACETAMINOPHEN 325 MG PO TABS: 325.0000 mg | ORAL_TABLET | ORAL | Status: DC | PRN

## 2023-07-25 MED ORDER — PHENOL 1.4 % MT LIQD: 1.0000 | OROMUCOSAL | Status: DC | PRN

## 2023-07-25 MED ORDER — PHENYLEPHRINE 80 MCG/ML (10ML) SYRINGE FOR IV PUSH (FOR BLOOD PRESSURE SUPPORT)
PREFILLED_SYRINGE | INTRAVENOUS | Status: DC | PRN
  Administered 2023-07-25: 80 ug via INTRAVENOUS

## 2023-07-25 MED ORDER — FAMOTIDINE IN NACL 20-0.9 MG/50ML-% IV SOLN
20.0000 mg | Freq: Two times a day (BID) | INTRAVENOUS | Status: DC
  Administered 2023-07-25 (×2): 20 mg via INTRAVENOUS
  Filled 2023-07-25 (×2): qty 50

## 2023-07-25 MED ORDER — OXYCODONE HCL 5 MG PO TABS
5.0000 mg | ORAL_TABLET | Freq: Once | ORAL | Status: AC | PRN
  Administered 2023-07-25: 5 mg via ORAL

## 2023-07-25 MED ORDER — PHENYLEPHRINE HCL-NACL 20-0.9 MG/250ML-% IV SOLN
INTRAVENOUS | Status: AC
Start: 2023-07-25 — End: 2023-07-25
  Filled 2023-07-25: qty 250

## 2023-07-25 MED ORDER — CHLORHEXIDINE GLUCONATE CLOTH 2 % EX PADS: 6.0000 | MEDICATED_PAD | Freq: Once | CUTANEOUS | Status: DC

## 2023-07-25 MED ORDER — GUAIFENESIN-DM 100-10 MG/5ML PO SYRP: 15.0000 mL | ORAL_SOLUTION | ORAL | Status: DC | PRN

## 2023-07-25 MED ORDER — ROCURONIUM BROMIDE 10 MG/ML (PF) SYRINGE
PREFILLED_SYRINGE | INTRAVENOUS | Status: AC
Start: 2023-07-25 — End: 2023-07-25
  Filled 2023-07-25: qty 10

## 2023-07-25 MED ORDER — LIDOCAINE HCL (PF) 2 % IJ SOLN
INTRAMUSCULAR | Status: AC
Start: 2023-07-25 — End: 2023-07-25
  Filled 2023-07-25: qty 5

## 2023-07-25 MED ORDER — HYDRALAZINE HCL 20 MG/ML IJ SOLN: 5.0000 mg | INTRAMUSCULAR | Status: DC | PRN

## 2023-07-25 MED ORDER — PROPOFOL 10 MG/ML IV BOLUS
INTRAVENOUS | Status: AC
  Filled 2023-07-25: qty 20

## 2023-07-25 MED ORDER — HYDROMORPHONE HCL 1 MG/ML IJ SOLN: 1.0000 mg | Freq: Once | INTRAMUSCULAR | Status: DC | PRN

## 2023-07-25 MED ORDER — LACTATED RINGERS IV SOLN: INTRAVENOUS | Status: DC

## 2023-07-25 MED ORDER — METOPROLOL TARTRATE 50 MG PO TABS
50.0000 mg | ORAL_TABLET | Freq: Two times a day (BID) | ORAL | Status: DC
  Administered 2023-07-25: 50 mg via ORAL
  Filled 2023-07-25 (×2): qty 1

## 2023-07-25 MED ORDER — IODIXANOL 320 MG/ML IV SOLN
INTRAVENOUS | Status: DC | PRN
  Administered 2023-07-25: 105 mL via INTRA_ARTERIAL

## 2023-07-25 SURGICAL SUPPLY — 42 items
BLADE SURG SZ11 CARB STEEL (BLADE) IMPLANT
BRUSH SCRUB EZ 4% CHG (MISCELLANEOUS) IMPLANT
CATH ACCU-VU SIZ PIG 5F 70CM (CATHETERS) IMPLANT
CATH BALLN CODA 9X100X32 (BALLOONS) IMPLANT
CATH BEACON 5 .035 40 KMP TP (CATHETERS) IMPLANT
CATH BEACON 5 .035 65 RIM TIP (CATHETERS) IMPLANT
CATH KUMPE SOFT-VU 5FR 65 (CATHETERS) IMPLANT
CHLORAPREP W/TINT 26 (MISCELLANEOUS) IMPLANT
CLAMP SUTURE YELLOW 5 PAIRS (MISCELLANEOUS) IMPLANT
CLOSURE PERCLOSE PROSTYLE (VASCULAR PRODUCTS) IMPLANT
COVER DRAPE FLUORO 36X44 (DRAPES) IMPLANT
COVER PROBE ULTRASOUND 5X96 (MISCELLANEOUS) IMPLANT
DERMABOND ADVANCED .7 DNX12 (GAUZE/BANDAGES/DRESSINGS) IMPLANT
DEVICE SAFEGUARD 24CM (GAUZE/BANDAGES/DRESSINGS) IMPLANT
DEVICE TORQUE (MISCELLANEOUS) IMPLANT
ELECTRODE REM PT RTRN 9FT ADLT (ELECTROSURGICAL) IMPLANT
EXCLUDER TNK END 26X14.5X12 16 (Endovascular Graft) IMPLANT
GLIDEWIRE ADV .035X180CM (WIRE) IMPLANT
GLIDEWIRE ADV .035X260CM (WIRE) IMPLANT
GLIDEWIRE STIFF .35X180X3 HYDR (WIRE) IMPLANT
GOWN STRL REUS W/ TWL LRG LVL3 (GOWN DISPOSABLE) IMPLANT
GUIDEWIRE ANGLED .035X260CM (WIRE) IMPLANT
KIT ATRIEVE SNARE 18-30 7FR (MISCELLANEOUS) IMPLANT
LEG CONTRALATERAL 23X10 (Vascular Products) IMPLANT
LEG CONTRALATERAL 27X12 (Vascular Products) IMPLANT
NDL ENTRY 21GA 7CM ECHOTIP (NEEDLE) IMPLANT
NEEDLE ENTRY 21GA 7CM ECHOTIP (NEEDLE) ×1 IMPLANT
PACK ANGIOGRAPHY (CUSTOM PROCEDURE TRAY) ×1 IMPLANT
PACK BASIN MAJOR ARMC (MISCELLANEOUS) IMPLANT
SET INTRO CAPELLA COAXIAL (SET/KITS/TRAYS/PACK) IMPLANT
SHEATH BRITE TIP 6FRX11 (SHEATH) IMPLANT
SHEATH BRITE TIP 8FRX11 (SHEATH) IMPLANT
SHEATH DRYSEAL FLEX 12FR 33CM (SHEATH) IMPLANT
SHEATH DRYSEAL FLEX 16FR 33CM (SHEATH) IMPLANT
SHEATH INTRO CHECKFLO 16F 13 (SHEATH) IMPLANT
SUT MNCRL+ 5-0 UNDYED PC-3 (SUTURE) IMPLANT
SYR MEDRAD MARK 7 150ML (SYRINGE) IMPLANT
TRAY FOLEY SLVR 16FR LF STAT (SET/KITS/TRAYS/PACK) IMPLANT
TUBING CONTRAST HIGH PRESS 72 (TUBING) IMPLANT
WIRE AMPLATZ SSTIFF .035X260CM (WIRE) IMPLANT
WIRE G LUND 35X260X7 (WIRE) IMPLANT
WIRE J 3MM .035X145CM (WIRE) IMPLANT

## 2023-07-25 NOTE — Progress Notes (Signed)
 Pt. States he left his dentures in the car. Glasses handed to wife, along with clothes, shoes.

## 2023-07-25 NOTE — Interval H&P Note (Signed)
 History and Physical Interval Note:  07/25/2023 8:12 AM  Samuel Boyd  has presented today for surgery, with the diagnosis of AAA   GORE  ANESTHESIA     AAA repair.  The various methods of treatment have been discussed with the patient and family. After consideration of risks, benefits and other options for treatment, the patient has consented to  Procedure(s): ENDOVASCULAR REPAIR/STENT GRAFT (N/A) as a surgical intervention.  The patient's history has been reviewed, patient examined, no change in status, stable for surgery.  I have reviewed the patient's chart and labs.  Questions were answered to the patient's satisfaction.     Devon Fogo

## 2023-07-25 NOTE — Transfer of Care (Cosign Needed)
 Immediate Anesthesia Transfer of Care Note  Patient: Samuel Boyd  Procedure(s) Performed: ENDOVASCULAR REPAIR/STENT GRAFT  Patient Location: PACU  Anesthesia Type:General  Level of Consciousness: drowsy, patient cooperative, and responds to stimulation  Airway & Oxygen Therapy: Patient Spontanous Breathing and Patient connected to face mask  Post-op Assessment: Report given to RN, Post -op Vital signs reviewed and stable, and Patient moving all extremities X 4  Post vital signs: Reviewed and stable  Last Vitals:  Vitals Value Taken Time  BP 138/94 07/25/23 10:37  Temp    Pulse 96 07/25/23 10:42  Resp 20 07/25/23 10:42  SpO2 97 % 07/25/23 10:42  Vitals shown include unfiled device data.  Last Pain:  Vitals:   07/25/23 0724  TempSrc:   PainSc: 0-No pain         Complications: There were no known notable events for this encounter.

## 2023-07-25 NOTE — Anesthesia Preprocedure Evaluation (Addendum)
 Anesthesia Evaluation  Patient identified by MRN, date of birth, ID band Patient awake    Reviewed: Allergy & Precautions, NPO status , Patient's Chart, lab work & pertinent test results  History of Anesthesia Complications Negative for: history of anesthetic complications  Airway Mallampati: III  TM Distance: >3 FB Neck ROM: full    Dental  (+) Edentulous Upper, Edentulous Lower   Pulmonary Current Smoker and Patient abstained from smoking.   Pulmonary exam normal        Cardiovascular hypertension, On Medications + Peripheral Vascular Disease and +CHF  + dysrhythmias Atrial Fibrillation   Most recent TTE performed on 07/06/2023 revealed a moderately reduced left ventricular systolic function with an EF of 30%. There was global hypokinesis.  Left ventricular diastolic Doppler parameters indeterminate.  Right ventricular size and function normal with a TAPSE measuring 1.6 cm  (normal range >/= 1.6 cm).  Biatrial enlargement was noted; severe on the LEFT.  There was trivial pulmonary and tricuspid valvular agitation.  Cannot all transvalvular gradients were noted to be normal providing no evidence of hemodynamically significant valvular stenosis. Aorta normal in size with no evidence of ectasia or aneurysmal dilatation.   Neuro/Psych CVA  negative psych ROS   GI/Hepatic negative GI ROS, Neg liver ROS,,,  Endo/Other  diabetes, Type 2    Renal/GU      Musculoskeletal   Abdominal   Peds  Hematology negative hematology ROS (+)   Anesthesia Other Findings Past Medical History: 06/11/2018: Abdominal aortic aneurysm (AAA) without rupture (HCC) 06/29/2023: Aneurysm of common iliac artery (BILATERAL)     Comment:  a.) CTA abd/pelvis 06/29/2023: measuring up to 2.1 cm               (RIGHT) and 1.7 cm  (LEFT) No date: Aortic atherosclerosis (HCC) No date: Aortic root dilatation (HCC) No date: Arthritis 03/05/2022:  Atherosclerosis of native arteries of extremity with  intermittent claudication (HCC) No date: Cerebral microvascular disease No date: DDD (degenerative disc disease), lumbosacral No date: Diverticulosis No date: HFrEF (heart failure with reduced ejection fraction) (HCC) 01/2021: History of 2019 novel coronavirus disease (COVID-19) No date: History of kidney stones 09/11/2018: Hyperlipemia, mixed No date: Hypertension 07/08/2019: Ischemic stroke [facility])     Comment:  a.) MRI brain 07/08/2019: patchy cortical/subcortical               acute/early subacute infarcts within the paramedian               posterior RIGHT frontal lobe 09/24/2022: Ischemic stroke [facility])     Comment:  a.) MRI brain 09/24/2022: acute infarct in the posterior              limb of the RIGHT internal capsule No date: Long-term use of aspirin  therapy No date: On apixaban therapy No date: Persistent atrial fibrillation (HCC)     Comment:  a.) CHA2DS2VASc = 8 (age x2, HFrEF, HTN, CVA x               2,vascular disease, T2DM) as of 07/24/2023; b.)               rate/rhythm maintained on oral metoprolol tartrate;               chronically anticoagulated with apixaban No date: Pneumonia No date: Secondary cardiomyopathy (HCC) 09/24/2022: Secondary erythrocytosis     Comment:  a.) smoking, overweight No date: Status post left inguinal hernia repair No date: Stroke [facility])     Comment:  a.)  noted on brain MRI 07/08/2019: chronic LEFT parietal              lobe infarct; chronic lacunar infarcts within the LEFT               basal ganglia, LEFT thalamus, brainstem and cerebellum No date: T2DM (type 2 diabetes mellitus) (HCC) 09/11/2018: Tobacco dependence No date: Valvular heart disease No date: Vitamin D deficiency  Past Surgical History: 09/16/2018: CYSTOSCOPY/URETEROSCOPY/HOLMIUM LASER/STENT PLACEMENT; Left     Comment:  Procedure: CYSTOSCOPY/URETEROSCOPY/HOLMIUM LASER/STENT               PLACEMENT, LEFT;  Surgeon:  Dustin Gimenez, MD;                Location: [facility] ORS;  Service: Urology;  Laterality: Left; 09/16/2018: INGUINAL HERNIA REPAIR; Left     Comment:  Procedure: HERNIA REPAIR INGUINAL ADULT;  Surgeon:               Eldred Grego, MD;  Location: [facility] ORS;  Service:              General;  Laterality: Left;     Reproductive/Obstetrics negative OB ROS                             Anesthesia Physical Anesthesia Plan  ASA: 3  Anesthesia Plan: General ETT   Post-op Pain Management: Ofirmev  IV (intra-op)* and Toradol IV (intra-op)*   Induction: Intravenous  PONV Risk Score and Plan: 2 and Ondansetron , Dexamethasone  and Treatment may vary due to age or medical condition  Airway Management Planned: Oral ETT  Additional Equipment: Arterial line  Intra-op Plan:   Post-operative Plan: Extubation in OR  Informed Consent: I have reviewed the patients History and Physical, chart, labs and discussed the procedure including the risks, benefits and alternatives for the proposed anesthesia with the patient or authorized representative who has indicated his/her understanding and acceptance.     Dental Advisory Given  Plan Discussed with: Anesthesiologist, CRNA and Surgeon  Anesthesia Plan Comments: (Patient consented for risks of anesthesia including but not limited to:  - adverse reactions to medications - damage to eyes, teeth, lips or other oral mucosa - nerve damage due to positioning  - sore throat or hoarseness - Damage to heart, brain, nerves, lungs, other parts of body or loss of life  Patient voiced understanding and assent.)        Anesthesia Quick Evaluation

## 2023-07-25 NOTE — Op Note (Signed)
 OPERATIVE NOTE   PROCEDURE: US  guidance for vascular access, bilateral femoral arteries Catheter placement into aorta from bilateral femoral approaches Placement of a 26 mm diameter proximal conformable C3 Gore Excluder Endoprosthesis main body left with a 27 mm diameter by 12 cm length right iliac contralateral limb Placement of a 23 mm diameter by 10 cm length left iliac extension  ProGlide closure devices bilateral femoral arteries  PRE-OPERATIVE DIAGNOSIS: AAA  POST-OPERATIVE DIAGNOSIS: same  SURGEON: Mikki Alexander, MD and Devon Fogo, MD - Co-surgeons  ANESTHESIA: General  ESTIMATED BLOOD LOSS: 50 cc  FINDING(S): 1.  AAA  SPECIMEN(S):  none  INDICATIONS:   Samuel Boyd is a 78 y.o. male who presents with a greater than 5 cm infrarenal abdominal aortic aneurysm posing risk of lethal rupture. The anatomy was suitable for endovascular repair.  Risks and benefits of repair in an endovascular fashion were discussed and informed consent was obtained. Co-surgeons are used to expedite the procedure and reduce operative time as bilateral work needs to be done.  DESCRIPTION: After obtaining full informed written consent, the patient was brought back to the operating room and placed supine upon the operating table.  The patient received IV antibiotics prior to induction.  After obtaining adequate anesthesia, the patient was prepped and draped in the standard fashion for endovascular AAA repair.  We then began by gaining access to both femoral arteries with US  guidance with me working on the right and Dr. Prescilla Brod working on the left.  The femoral arteries were found to be patent and accessed without difficulty with a needle under ultrasound guidance without difficulty on each side and permanent images were recorded.  We then placed 2 proglide devices on each side in a pre-close fashion and placed 8 French sheaths. The patient was then given 7000 units of intravenous heparin. The  Pigtail catheter was placed into the aorta from the right side. Using this image, we selected a 26 mm diameter proximal conformable Gore excluder C3 Main body device.  Over a stiff Lunderquist wire, an 16 French sheath was placed up the left side.  This was somewhat tedious due to the extensive tortuosity in the iliac vessels.  Glidewire and Kumpe catheter had to be used to gain access to the aorta from each side before exchanging for the stiff wires to get the larger sheaths in.  The main body was then placed through the left side at 16 French sheath. A Kumpe catheter was placed up the right side and a magnified image at the renal arteries was performed. The main body was then deployed just below the lowest renal artery which was the left. The Kumpe catheter was used to cannulate the contralateral gate without difficulty and successful cannulation was confirmed by twirling the pigtail catheter in the main body. We then placed a stiff wire and a retrograde arteriogram was performed through the right femoral sheath. We upsized to the 16 [place] sheath for the contralateral limb and a 27 mm diameter by 12 cm length right iliac limb was selected and deployed.  This was taken down to just above the right hypogastric artery.  The main body deployment was then completed. Based off the angiographic findings, extension limbs were necessary.  A 23 mm diameter by 10 cm length left iliac extension limb was taken to just above the left hypogastric artery.. All junction points and seals zones were treated with the compliant balloon. The pigtail catheter was then replaced and a completion angiogram was  performed.  No apparent endoleak was detected on completion angiography. The renal arteries were found to be widely patent.  Both internal iliac arteries were widely patent as well. At this point we elected to terminate the procedure. We secured the pro glide devices for hemostasis on the femoral arteries. The skin incision was  closed with a 4-0 Monocryl. Dermabond and pressure dressing were placed. The patient was taken to the recovery room in stable condition having tolerated the procedure well.  COMPLICATIONS: none  CONDITION: stable  Mikki Alexander  07/25/2023, 10:42 AM   This note was created with Dragon Medical transcription system. Any errors in dictation are purely unintentional.

## 2023-07-25 NOTE — Plan of Care (Signed)
  Problem: Education: Goal: Knowledge of General Education information will improve Description: Including pain rating scale, medication(s)/side effects and non-pharmacologic comfort measures Outcome: Progressing   Problem: Health Behavior/Discharge Planning: Goal: Ability to manage health-related needs will improve Outcome: Progressing   Problem: Clinical Measurements: Goal: Ability to maintain clinical measurements within normal limits will improve Outcome: Progressing Goal: Will remain free from infection Outcome: Progressing Goal: Diagnostic test results will improve Outcome: Progressing Goal: Cardiovascular complication will be avoided Outcome: Progressing   Problem: Activity: Goal: Risk for activity intolerance will decrease Outcome: Progressing   Problem: Nutrition: Goal: Adequate nutrition will be maintained Outcome: Progressing   Problem: Elimination: Goal: Will not experience complications related to bowel motility Outcome: Progressing   Problem: Pain Managment: Goal: General experience of comfort will improve and/or be controlled Outcome: Progressing

## 2023-07-25 NOTE — Anesthesia Procedure Notes (Signed)
 Procedure Name: Intubation Date/Time: 07/25/2023 8:17 AM  Performed by: Ian Maine, RNPre-anesthesia Checklist: Patient identified, Patient being monitored, Timeout performed, Emergency Drugs available and Suction available Patient Re-evaluated:Patient Re-evaluated prior to induction Oxygen Delivery Method: Circle system utilized Preoxygenation: Pre-oxygenation with 100% oxygen Induction Type: IV induction Ventilation: Two handed mask ventilation required Laryngoscope Size: McGrath and 4 Grade View: Grade II Tube type: Oral Tube size: 7.5 mm Number of attempts: 1 Airway Equipment and Method: Stylet Placement Confirmation: ETT inserted through vocal cords under direct vision, positive ETCO2 and breath sounds checked- equal and bilateral Secured at: 21 cm Tube secured with: Tape Dental Injury: Teeth and Oropharynx as per pre-operative assessment

## 2023-07-25 NOTE — Op Note (Signed)
 OPERATIVE NOTE   PROCEDURE: US  guidance for vascular access, bilateral femoral arteries Catheter placement into aorta from bilateral femoral approaches Placement of a 26 x 14 x 12 cm 3 conformable Gore Excluder Endoprosthesis main body with a 23.10 bell bottom ipsilateral extender and a 27 x 12 bell bottom contralateral limb ProGlide closure devices bilateral femoral arteries  PRE-OPERATIVE DIAGNOSIS: AAA  POST-OPERATIVE DIAGNOSIS: same  SURGEON: Devon Fogo, MD and Mikki Alexander, MD - Co-surgeons  ANESTHESIA: general  ESTIMATED BLOOD LOSS: 50 cc  FINDING(S): 1.  AAA  SPECIMEN(S):  none  INDICATIONS:   Samuel Boyd is a 78 y.o. y.o. male who presents with presents with an abdominal aortic aneurysm that is greater than 5 cm placing him at risk for lethal rupture.  The anatomy is suitable for endovascular repair.  Risks and benefits for repair of the abdominal aortic aneurysm using an endograft was described in detail to the patient all questions have been answered patient agrees to proceed.  Co-surgeons are utilized to expedite the procedure and reduce operative time improving patient's safety and improving outcome.  DESCRIPTION: After obtaining full informed written consent, the patient was brought back to the operating room and placed supine upon the operating table.  The patient received IV antibiotics prior to induction.  After obtaining adequate anesthesia, the patient was prepped and draped in the standard fashion for endovascular AAA repair.  Co-surgeons are required because this is a complex bilateral procedure with work being performed simultaneously from both the right femoral and left femoral approach.  This also expedites the procedure making a shorter operative time reducing complications and improving patient safety.  We then began by gaining access to both femoral arteries with US  guidance with me working on the patient's and Dr. Vonna Guardian working on the patient's  right.  The femoral arteries were found to be patent and accessed without difficulty with a needle under ultrasound guidance without difficulty on each side and permanent images were recorded.  6 French sheaths were then placed fashion.  We then placed 2 proglide devices on the right side in a pre-close fashion and placed 8 [place] sheaths.  On the left side the exhalatory appeared to have a 360 degree movement followed by 180 degree loop this necessitated several maneuvers in order to place the Pro-glide's and ultimately to place the line.  Using a variety of different wires and a Kumpe catheter we were able to negotiate an Amplatz wire into the descending aorta and then we placed a 2 ProGlide devices.  He then upsized to an 8 [place] sheath.  Next plan was to get the 16 French sheath up and into the aorta.  Again this necessitated several maneuvers and including placing a short 16 French sheath advancing an Amplatz wire back and then using a 12 French sheath dilator followed by placing the Lunderquist wire and then avoid hitting at this point we were able to advance the 16 French dry seal under fluoroscopic guidance positioning.  No [place] at the tip in the proximal aneurysm.  The patient was then given 7000 units of intravenous heparin.   The Pigtail catheter was placed into the aorta from the left side. Using this image, we selected a 26 x 14 x 12 Main body device.  Over a Lunderquist stiff wire, an 16 French sheath was placed. The main body was then placed through the 16 French sheath. A Kumpe catheter was placed up the right side and a magnified image at the renal  arteries was performed. The main body was then deployed just below the lowest renal artery. The Kumpe catheter was used to cannulate the contralateral gate without difficulty and successful cannulation was confirmed by twirling the pigtail catheter in the main body. We then placed a stiff wire and a retrograde arteriogram was performed through the  right femoral sheath. We upsized to the 16 French sheath for the contralateral limb and a 27 x 12 bell bottom limb was selected and deployed. The main body deployment was then completed. Based off the angiographic findings, extension limbs were necessary on the left.  Hand-injection contrast through the sheath of the left thigh was performed and a 23 x 10 yellow-black ipsilateral extension limb was selected was then advanced through the sheath and deployed without difficulty. All junction points and seals zones were treated with the compliant balloon.   The pigtail catheter was then replaced and a completion angiogram was performed.   No endoleak was detected on completion angiography. The renal arteries were found to be widely patent.  Bilateral internal iliac arteries remain patent.   At this point we elected to terminate the procedure. We secured the pro glide devices for hemostasis on the femoral arteries. The skin incision was closed with a 4-0 Monocryl. Dermabond and pressure dressing were placed. The patient was taken to the recovery room in stable condition having tolerated the procedure well.  COMPLICATIONS: none  CONDITION: stable  Devon Fogo  07/25/2023, 10:54 AM

## 2023-07-26 DIAGNOSIS — I714 Abdominal aortic aneurysm, without rupture, unspecified: Secondary | ICD-10-CM

## 2023-07-26 LAB — BASIC METABOLIC PANEL WITH GFR
Anion gap: 7 (ref 5–15)
BUN: 21 mg/dL (ref 8–23)
CO2: 22 mmol/L (ref 22–32)
Calcium: 8 mg/dL — ABNORMAL LOW (ref 8.9–10.3)
Chloride: 107 mmol/L (ref 98–111)
Creatinine, Ser: 1.07 mg/dL (ref 0.61–1.24)
GFR, Estimated: >60 mL/min (ref >60)
Glucose, Bld: 169 mg/dL — ABNORMAL HIGH (ref 70–99)
Potassium: 4.4 mmol/L (ref 3.5–5.1)
Sodium: 136 mmol/L (ref 135–145)

## 2023-07-26 LAB — CBC
HCT: 48.4 % (ref 39.0–52.0)
Hemoglobin: 16.1 g/dL (ref 13.0–17.0)
MCH: 33.7 pg (ref 26.0–34.0)
MCHC: 33.3 g/dL (ref 30.0–36.0)
MCV: 101.3 fL — ABNORMAL HIGH (ref 80.0–100.0)
Platelets: 118 10*3/uL — ABNORMAL LOW (ref 150–400)
RBC: 4.78 MIL/uL (ref 4.22–5.81)
RDW: 13.3 % (ref 11.5–15.5)
WBC: 12.1 10*3/uL — ABNORMAL HIGH (ref 4.0–10.5)
nRBC: 0 % (ref 0.0–0.2)

## 2023-07-26 MED ORDER — FAMOTIDINE 20 MG PO TABS
20.0000 mg | ORAL_TABLET | Freq: Two times a day (BID) | ORAL | Status: DC
  Filled 2023-07-26: qty 1

## 2023-07-26 NOTE — Anesthesia Postprocedure Evaluation (Signed)
 Anesthesia Post Note  Patient: Samuel Boyd  Procedure(s) Performed: ENDOVASCULAR REPAIR/STENT GRAFT  Patient location during evaluation: ICU Anesthesia Type: General Level of consciousness: awake and alert and oriented Pain management: satisfactory to patient Vital Signs Assessment: post-procedure vital signs reviewed and stable Respiratory status: spontaneous breathing Cardiovascular status: blood pressure returned to baseline Postop Assessment: adequate PO intake Anesthetic complications: no Comments: Just had foley removed recently. Has not voided yet but feels urge to do so.   There were no known notable events for this encounter.   Last Vitals:  Vitals:   07/26/23 0500 07/26/23 0615  BP: 121/88 108/89  Pulse: 71 79  Resp: 20 (!) 22  Temp:    SpO2: 98% 95%    Last Pain:  Vitals:   07/26/23 0645  TempSrc:   PainSc: 1                  Jalyn Rosero Sabra Cramp

## 2023-07-26 NOTE — Plan of Care (Signed)
  Problem: Education: Goal: Knowledge of General Education information will improve Description: Including pain rating scale, medication(s)/side effects and non-pharmacologic comfort measures Outcome: Progressing   Problem: Health Behavior/Discharge Planning: Goal: Ability to manage health-related needs will improve Outcome: Progressing   Problem: Clinical Measurements: Goal: Ability to maintain clinical measurements within normal limits will improve Outcome: Progressing Goal: Will remain free from infection Outcome: Progressing Goal: Diagnostic test results will improve Outcome: Progressing Goal: Respiratory complications will improve Outcome: Progressing Goal: Cardiovascular complication will be avoided Outcome: Progressing   Problem: Nutrition: Goal: Adequate nutrition will be maintained Outcome: Progressing   Problem: Coping: Goal: Level of anxiety will decrease Outcome: Progressing   Problem: Safety: Goal: Ability to remain free from injury will improve Outcome: Progressing   

## 2023-07-26 NOTE — Progress Notes (Signed)
 PAD removed, foley removed per order.

## 2023-07-26 NOTE — Discharge Summary (Signed)
 [facility]    Discharge Summary    Patient ID:  Samuel Boyd MRN: [id number] DOB/AGE: 76-Oct-1947 76 y.o.  Admit date: 07/25/2023 Discharge date: 07/26/2023 Date of Surgery: 07/25/2023 Surgeon: Surgeon(s): Schnier, Ninette Basque, MD [employer], MD  Admission Diagnosis: Abdominal aortic aneurysm (AAA) without rupture, unspecified part (HCC) [I71.40] AAA (abdominal aortic aneurysm) without rupture [facility]) [I71.40]  Discharge Diagnoses:  Abdominal aortic aneurysm (AAA) without rupture, unspecified part (HCC) [I71.40] AAA (abdominal aortic aneurysm) without rupture (HCC) [I71.40]  Secondary Diagnoses: Past Medical History:  Diagnosis Date   Abdominal aortic aneurysm (AAA) without rupture (HCC) 06/11/2018   Aneurysm of common iliac artery (BILATERAL) 06/29/2023   a.) CTA abd/pelvis 06/29/2023: measuring up to 2.1 cm (RIGHT) and 1.7 cm  (LEFT)   Aortic atherosclerosis (HCC)    Aortic root dilatation (HCC)    Arthritis    Atherosclerosis of native arteries of extremity with intermittent claudication (HCC) 03/05/2022   Cerebral microvascular disease    DDD (degenerative disc disease), lumbosacral    Diverticulosis    HFrEF (heart failure with reduced ejection fraction) (HCC)    History of 2019 novel coronavirus disease (COVID-19) 01/2021   History of kidney stones    Hyperlipemia, mixed 09/11/2018   Hypertension    Ischemic stroke (HCC) 07/08/2019   a.) MRI brain 07/08/2019: patchy cortical/subcortical acute/early subacute infarcts within the paramedian posterior RIGHT frontal lobe   Ischemic stroke (HCC) 09/24/2022   a.) MRI brain 09/24/2022: acute infarct in the posterior limb of the RIGHT internal capsule   Long-term use of aspirin  therapy    On apixaban therapy    Persistent atrial fibrillation (HCC)    a.) CHA2DS2VASc = 8 (age x2, HFrEF, HTN, CVA x 2,vascular disease, T2DM) as of 07/24/2023; b.) rate/rhythm maintained on oral metoprolol  tartrate; chronically anticoagulated with apixaban   Pneumonia    Secondary cardiomyopathy (HCC)    Secondary erythrocytosis 09/24/2022   a.) smoking, overweight   Status post left inguinal hernia repair    Stroke [facility])    a.) noted on brain MRI 07/08/2019: chronic LEFT parietal lobe infarct; chronic lacunar infarcts within the LEFT basal ganglia, LEFT thalamus, brainstem and cerebellum   T2DM (type 2 diabetes mellitus) (HCC)    Tobacco dependence 09/11/2018   Valvular heart disease    Vitamin D deficiency     Procedure(s): ENDOVASCULAR REPAIR/STENT GRAFT  Discharged Condition: good  HPI:  Samuel Boyd is a 78 yo male who is now POD #1 from Endovascular AAA repair with stent placement.  Bilateral groins were cannulated for the procedure.  No complications to note such as hematoma seroma or infection.  Dressings are clean dry and intact this morning.  Patient's bilateral lower extremities are warm to touch with palpable PT pulses.  Patient is eating, ambulating and urinating well.  Patient's vitals are remained stable.  Patient to be discharged home today.  Patient is being discharged on aspirin  81 mg daily, Eliquis 5 mg twice daily and Crestor 40 mg daily.  Patient was counseled not to miss or skip taking any of these medications as it will alter the outcome of his procedure.  Patient and wife at the bedside verbalized her understanding.  I spent greater than 60 minutes preparing teaching and completing the patient's discharge.  Hospital Course:  Samuel Boyd is a 79 y.o. male is S/P Bilateral Extubated: POD # 0 Physical Exam:  Alert notes x3, no acute distress Face: Symmetrical.  Tongue is midline. Neck:  Trachea is midline.  No swelling or bruising. Cardiovascular: Regular rate and rhythm Pulmonary: Clear to auscultation bilaterally Abdomen: Soft, nontender, nondistended Right groin access: Clean dry and intact.  No swelling or drainage noted Left groin access: Clean  dry and intact.  No swelling or drainage noted Left lower extremity: Thigh soft.  Calf soft.  Extremities warm distally toes.  Hard to palpate pedal pulses however the foot is warm is her good capillary refill. Right lower extremity: Thigh soft.  Calf soft.  Extremities warm distally toes.  Hard to palpate pedal pulses however the foot is warm is her good capillary refill. Neurological: No deficits noted   Post-op wounds:  clean, dry, intact or healing well  Pt. Ambulating, voiding and taking PO diet without difficulty. Pt pain controlled with PO pain meds.  Labs:  As below  Complications: none  Consults:    Significant Diagnostic Studies: CBC Lab Results  Component Value Date   WBC 12.1 (H) 07/26/2023   HGB 16.1 07/26/2023   HCT 48.4 07/26/2023   MCV 101.3 (H) 07/26/2023   PLT 118 (L) 07/26/2023    BMET    Component Value Date/Time   NA 136 07/26/2023 0217   K 4.4 07/26/2023 0217   CL 107 07/26/2023 0217   CO2 22 07/26/2023 0217   GLUCOSE 169 (H) 07/26/2023 0217   BUN 21 07/26/2023 0217   CREATININE 1.07 07/26/2023 0217   CALCIUM 8.0 (L) 07/26/2023 0217   GFRNONAA >60 07/26/2023 0217   GFRAA >60 07/08/2019 1058   COAG Lab Results  Component Value Date   INR 1.1 09/24/2022   INR 1.2 07/08/2019     Disposition:  Discharge to :Home  Allergies as of 07/26/2023   No Known Allergies      Medication List     TAKE these medications    aspirin  EC 81 MG tablet Take 81 mg by mouth daily. Swallow whole.   Eliquis 5 MG Tabs tablet Generic drug: apixaban Take 5 mg by mouth 2 (two) times daily.   Fish Oil 1200 MG Caps Take 1,200 mg by mouth every evening.   furosemide 20 MG tablet Commonly known as: LASIX TAKE 1 TABLET BY MOUTH EVERY DAY, TAKE 2ND DOSE IN 6 HOURS ONLY AS NEEDED   Jardiance 10 MG Tabs tablet Generic drug: empagliflozin Take 10 mg by mouth daily.   lisinopril 5 MG tablet Commonly known as: ZESTRIL Take 10 mg by mouth every  evening.   loratadine 10 MG tablet Commonly known as: CLARITIN Take 10 mg by mouth daily.   metoprolol tartrate 50 MG tablet Commonly known as: LOPRESSOR Take 50 mg by mouth 2 (two) times daily.   rosuvastatin 40 MG tablet Commonly known as: CRESTOR Take 40 mg by mouth daily with supper.   VITAMIN D-3 PO Take by mouth daily at 6 (six) AM.       Verbal and written Discharge instructions given to the patient. Wound care per Discharge AVS  Follow-up Information     Schnier, Ninette Basque, MD Follow up in 1 month(s).   Specialties: Vascular Surgery, Cardiology, Radiology, Vascular Surgery Why: EVAR Contact information: [address] [place] [address] [phone]                 Signed: Annamaria Barrette, NP  07/26/2023, 11:27 AM

## 2023-07-26 NOTE — Discharge Instructions (Addendum)
 Vascular Surgery Discharge Instructions:  Do not lift anything heavy for the next 2 weeks do not lift anything more than a gallon of milk.  You may shower tomorrow on 07/27/2023.  Shower with the old dressings to your groins in place.  Once out of the shower remove the old dressings, pat completely dry and cover both insertion sites with Band-Aids for the next 3 days.  You are being discharged on aspirin  81 mg daily, Eliquis 5 mg twice daily and Crestor 40 mg daily.  Do not miss or skip these medications as this will alter the outcome of your procedure.  Follow-up with vein and vascular surgery as scheduled.

## 2023-08-28 ENCOUNTER — Other Ambulatory Visit (INDEPENDENT_AMBULATORY_CARE_PROVIDER_SITE_OTHER): Payer: Self-pay | Admitting: Vascular Surgery

## 2023-08-28 DIAGNOSIS — I714 Abdominal aortic aneurysm, without rupture, unspecified: Secondary | ICD-10-CM

## 2023-08-30 ENCOUNTER — Ambulatory Visit (INDEPENDENT_AMBULATORY_CARE_PROVIDER_SITE_OTHER): Admitting: Nurse Practitioner

## 2023-08-30 ENCOUNTER — Encounter (INDEPENDENT_AMBULATORY_CARE_PROVIDER_SITE_OTHER): Payer: Self-pay | Admitting: Nurse Practitioner

## 2023-08-30 ENCOUNTER — Ambulatory Visit (INDEPENDENT_AMBULATORY_CARE_PROVIDER_SITE_OTHER)

## 2023-08-30 VITALS — BP 121/84 | HR 68 | Resp 16 | Ht 68.0 in | Wt 191.4 lb

## 2023-08-30 DIAGNOSIS — I7143 Infrarenal abdominal aortic aneurysm, without rupture: Secondary | ICD-10-CM

## 2023-08-30 DIAGNOSIS — I714 Abdominal aortic aneurysm, without rupture, unspecified: Secondary | ICD-10-CM

## 2023-08-30 NOTE — Progress Notes (Signed)
 Subjective:    Patient ID: Samuel Boyd, male    DOB: 03/08/1945, 78 y.o.   MRN: [id number] Chief Complaint  Patient presents with   Follow-up    1 month follow up stent graft    The patient returns to the office for surveillance of an abdominal aortic aneurysm status post stent graft placement on 07/25/2023.   Procedure: PROCEDURE: 1. US  guidance for vascular access, bilateral femoral arteries 2. Catheter placement into aorta from bilateral femoral approaches 3. Placement of a 26 x 14 x 12 cm 3 conformable Gore Excluder Endoprosthesis main body with a 23.10 bell bottom ipsilateral extender and a 27 x 12 bell bottom contralateral limb 4. ProGlide closure devices bilateral femoral arteries   Patient denies abdominal pain or back pain, no other abdominal complaints.  No symptoms consistent with distal embolization No changes in claudication distance or new rest pain symptoms. No interval development of new ulcers or wounds  There have been no significant interval changes in his overall healthcare since his last visit.   Patient denies amaurosis fugax or TIA symptoms.  The patient denies recent episodes of angina or shortness of breath.   Duplex US  of the aorta and iliac arteries shows a 5.53 AAA sac with no endoleak, no change in the sac compared to the previous study.    Review of Systems  All other systems reviewed and are negative.      Objective:   Physical Exam Vitals reviewed.  HENT:     Head: Normocephalic.  Cardiovascular:     Rate and Rhythm: Normal rate.     Pulses: Normal pulses.  Pulmonary:     Effort: Pulmonary effort is normal.  Skin:    General: Skin is warm and dry.  Neurological:     Mental Status: He is alert and oriented to person, place, and time.  Psychiatric:        Mood and Affect: Mood normal.        Behavior: Behavior normal.        Thought Content: Thought content normal.        Judgment: Judgment normal.     BP 121/84 (BP  Location: Left Arm, Patient Position: Sitting, Cuff Size: Normal)   Pulse 68   Resp 16   Ht 5' 8 (1.727 m)   Wt 191 lb 6.4 oz (86.8 kg)   BMI 29.10 kg/m   Past Medical History:  Diagnosis Date   Abdominal aortic aneurysm (AAA) without rupture (HCC) 06/11/2018   Aneurysm of common iliac artery (BILATERAL) 06/29/2023   a.) CTA abd/pelvis 06/29/2023: measuring up to 2.1 cm (RIGHT) and 1.7 cm  (LEFT)   Aortic atherosclerosis (HCC)    Aortic root dilatation (HCC)    Arthritis    Atherosclerosis of native arteries of extremity with intermittent claudication (HCC) 03/05/2022   Cerebral microvascular disease    DDD (degenerative disc disease), lumbosacral    Diverticulosis    HFrEF (heart failure with reduced ejection fraction) (HCC)    History of 2019 novel coronavirus disease (COVID-19) 01/2021   History of kidney stones    Hyperlipemia, mixed 09/11/2018   Hypertension    Ischemic stroke (HCC) 07/08/2019   a.) MRI brain 07/08/2019: patchy cortical/subcortical acute/early subacute infarcts within the paramedian posterior RIGHT frontal lobe   Ischemic stroke (HCC) 09/24/2022   a.) MRI brain 09/24/2022: acute infarct in the posterior limb of the RIGHT internal capsule   Long-term use of aspirin  therapy    On apixaban  therapy    Persistent atrial fibrillation (HCC)    a.) CHA2DS2VASc = 8 (age x2, HFrEF, HTN, CVA x 2,vascular disease, T2DM) as of 07/24/2023; b.) rate/rhythm maintained on oral metoprolol  tartrate; chronically anticoagulated with apixaban   Pneumonia    Secondary cardiomyopathy (HCC)    Secondary erythrocytosis 09/24/2022   a.) smoking, overweight   Status post left inguinal hernia repair    Stroke [facility])    a.) noted on brain MRI 07/08/2019: chronic LEFT parietal lobe infarct; chronic lacunar infarcts within the LEFT basal ganglia, LEFT thalamus, brainstem and cerebellum   T2DM (type 2 diabetes mellitus) (HCC)    Tobacco dependence 09/11/2018   Valvular heart disease     Vitamin D deficiency     Social History   Socioeconomic History   Marital status: Married    Spouse name: Terry   Number of children: Not on file   Years of education: Not on file   Highest education level: Not on file  Occupational History   Not on file  Tobacco Use   Smoking status: Some Days    Current packs/day: 0.50    Types: Cigarettes   Smokeless tobacco: Never  Vaping Use   Vaping status: Not on file  Substance and Sexual Activity   Alcohol use: Never   Drug use: Never   Sexual activity: Not on file  Other Topics Concern   Not on file  Social History Narrative   Not on file   Social Drivers of Health   Financial Resource Strain: Low Risk  (09/29/2022)   Received from [facility]   Overall Financial Resource Strain (CARDIA)    Difficulty of Paying Living Expenses: Not hard at all  Food Insecurity: No Food Insecurity (07/25/2023)   Hunger Vital Sign    Worried About Running Out of Food in the Last Year: Never true    Ran Out of Food in the Last Year: Never true  Transportation Needs: No Transportation Needs (07/25/2023)   PRAPARE - Administrator, Civil Service (Medical): No    Lack of Transportation (Non-Medical): No  Physical Activity: Unknown (07/18/2019)   Received from [facility]   Exercise Vital Sign    On average, how many days per week do you engage in moderate to strenuous exercise (like a brisk walk)?: 0 days    Minutes of Exercise per Session: Not on file  Stress: No Stress Concern Present (07/18/2019)   Received from [facility] - Occupational Stress Questionnaire    Feeling of Stress : Not at all  Social Connections: Socially Integrated (07/25/2023)   Social Connection and Isolation Panel    Frequency of Communication with Friends and Family: More than three times a week    Frequency of Social Gatherings with Friends and Family: Three  times a week    Attends Religious Services: 1 to 4 times per year    Active Member of Clubs or Organizations: No    Attends Banker Meetings: 1 to 4 times per year    Marital Status: Married  Catering manager Violence: Not At Risk (07/25/2023)   Humiliation, Afraid, Rape, and Kick questionnaire    Fear of Current or Ex-Partner: No    Emotionally Abused: No    Physically Abused: No    Sexually Abused: No    Past Surgical History:  Procedure Laterality Date   CYSTOSCOPY/URETEROSCOPY/HOLMIUM LASER/STENT PLACEMENT Left 09/16/2018  Procedure: CYSTOSCOPY/URETEROSCOPY/HOLMIUM LASER/STENT PLACEMENT, LEFT;  Surgeon: Penne Knee, MD;  Location: [facility] ORS;  Service: Urology;  Laterality: Left;   ENDOVASCULAR REPAIR/STENT GRAFT N/A 07/25/2023   Procedure: ENDOVASCULAR REPAIR/STENT GRAFT;  Surgeon: Jama Cordella MATSU, MD;  Location: [facility] INVASIVE CV LAB;  Service: Cardiovascular;  Laterality: N/A;   INGUINAL HERNIA REPAIR Left 09/16/2018   Procedure: HERNIA REPAIR INGUINAL ADULT;  Surgeon: Rodolph Romano, MD;  Location: [facility] ORS;  Service: General;  Laterality: Left;    History reviewed. No pertinent family history.  No Known Allergies     Latest Ref Rng & Units 07/26/2023    2:17 AM 09/24/2022   10:41 AM 07/08/2019   10:58 AM  CBC  WBC 4.0 - 10.5 K/uL 12.1  7.0  6.4   Hemoglobin 13.0 - 17.0 g/dL 83.8  81.5  83.6   Hematocrit 39.0 - 52.0 % 48.4  55.4  47.5   Platelets 150 - 400 K/uL 118  142  151       CMP     Component Value Date/Time   NA 136 07/26/2023 0217   K 4.4 07/26/2023 0217   CL 107 07/26/2023 0217   CO2 22 07/26/2023 0217   GLUCOSE 169 (H) 07/26/2023 0217   BUN 21 07/26/2023 0217   CREATININE 1.07 07/26/2023 0217   CALCIUM  8.0 (L) 07/26/2023 0217   PROT 7.9 09/24/2022 1041   ALBUMIN 4.0 09/24/2022 1041   AST 19 09/24/2022 1041   ALT 11 09/24/2022 1041   ALKPHOS 57 09/24/2022 1041   BILITOT 1.0 09/24/2022 1041   GFRNONAA >60 07/26/2023 0217      No results found.     Assessment & Plan:   1. Infrarenal abdominal aortic aneurysm (AAA) without rupture (HCC) (Primary) Recommend:  Patient is status post successful endovascular repair of the AAA.   No further intervention is required at this time.   No endoleak is detected and the aneurysm sac is stable.  The patient will continue antiplatelet therapy as prescribed as well as aggressive management of hyperlipidemia. Exercise is encouraged.   However, endografts require continued surveillance with ultrasound or CT scan. This is mandatory to detect any changes that allow repressurization of the aneurysm sac.  The patient is informed that this would be asymptomatic.  The patient is reminded that lifelong routine surveillance is a necessity with an endograft. Patient will continue to follow-up at the specified interval with ultrasound of the aorta.   Current Outpatient Medications on File Prior to Visit  Medication Sig Dispense Refill   apixaban (ELIQUIS) 5 MG TABS tablet Take 5 mg by mouth 2 (two) times daily.     aspirin  EC 81 MG tablet Take 81 mg by mouth daily. Swallow whole.     Cholecalciferol (VITAMIN D-3 PO) Take by mouth daily at 6 (six) AM.     furosemide (LASIX) 20 MG tablet TAKE 1 TABLET BY MOUTH EVERY DAY, TAKE 2ND DOSE IN 6 HOURS ONLY AS NEEDED     JARDIANCE 10 MG TABS tablet Take 10 mg by mouth daily.     lisinopril  (ZESTRIL ) 5 MG tablet Take 10 mg by mouth every evening.     loratadine  (CLARITIN ) 10 MG tablet Take 10 mg by mouth daily.     metoprolol  tartrate (LOPRESSOR ) 50 MG tablet Take 50 mg by mouth 2 (two) times daily.     Omega-3 Fatty Acids (FISH OIL) 1200 MG CAPS Take 1,200 mg by mouth every evening.     rosuvastatin  (CRESTOR ) 40 MG  tablet Take 40 mg by mouth daily with supper.     No current facility-administered medications on file prior to visit.    There are no Patient Instructions on file for this visit. No follow-ups on file.   Artrell Lawless E  Sabra Sessler, NP

## 2023-11-28 ENCOUNTER — Other Ambulatory Visit (INDEPENDENT_AMBULATORY_CARE_PROVIDER_SITE_OTHER): Payer: Self-pay | Admitting: Vascular Surgery

## 2023-11-28 DIAGNOSIS — I714 Abdominal aortic aneurysm, without rupture, unspecified: Secondary | ICD-10-CM

## 2023-11-30 ENCOUNTER — Ambulatory Visit (INDEPENDENT_AMBULATORY_CARE_PROVIDER_SITE_OTHER): Admitting: Nurse Practitioner

## 2023-11-30 ENCOUNTER — Ambulatory Visit (INDEPENDENT_AMBULATORY_CARE_PROVIDER_SITE_OTHER)

## 2023-11-30 ENCOUNTER — Encounter (INDEPENDENT_AMBULATORY_CARE_PROVIDER_SITE_OTHER): Payer: Self-pay | Admitting: Nurse Practitioner

## 2023-11-30 VITALS — BP 131/89 | HR 86 | Resp 16 | Wt 190.0 lb

## 2023-11-30 DIAGNOSIS — I7143 Infrarenal abdominal aortic aneurysm, without rupture: Secondary | ICD-10-CM | POA: Diagnosis not present

## 2023-11-30 DIAGNOSIS — I714 Abdominal aortic aneurysm, without rupture, unspecified: Secondary | ICD-10-CM | POA: Diagnosis not present

## 2023-11-30 DIAGNOSIS — E782 Mixed hyperlipidemia: Secondary | ICD-10-CM | POA: Diagnosis not present

## 2023-11-30 DIAGNOSIS — I10 Essential (primary) hypertension: Secondary | ICD-10-CM

## 2023-12-02 ENCOUNTER — Encounter (INDEPENDENT_AMBULATORY_CARE_PROVIDER_SITE_OTHER): Payer: Self-pay | Admitting: Nurse Practitioner

## 2023-12-02 NOTE — Progress Notes (Signed)
 Subjective:    Patient ID: Samuel Boyd, male    DOB: Sep 27, 1945, 78 y.o.   MRN: [id number] Chief Complaint  Patient presents with   Follow-up    3 month EVAR    The patient returns to the office for surveillance of an abdominal aortic aneurysm status post stent graft placement on 07/25/2023.   Procedure: PROCEDURE: 1. US  guidance for vascular access, bilateral femoral arteries 2. Catheter placement into aorta from bilateral femoral approaches 3. Placement of a 26 x 14 x 12 cm 3 conformable Gore Excluder Endoprosthesis main body with a 23.10 bell bottom ipsilateral extender and a 27 x 12 bell bottom contralateral limb 4. ProGlide closure devices bilateral femoral arteries   Patient denies abdominal pain or back pain, no other abdominal complaints.  No symptoms consistent with distal embolization No changes in claudication distance or new rest pain symptoms. No interval development of new ulcers or wounds  There have been no significant interval changes in his overall healthcare since his last visit.   Patient denies amaurosis fugax or TIA symptoms.  The patient denies recent episodes of angina or shortness of breath.   Duplex US  of the aorta and iliac arteries shows a 4.63 AAA sac with no endoleak, decrease in  the sac compared to the previous study at 5.53.    Review of Systems  All other systems reviewed and are negative.      Objective:   Physical Exam Vitals reviewed.  HENT:     Head: Normocephalic.  Cardiovascular:     Rate and Rhythm: Normal rate.     Pulses: Normal pulses.  Pulmonary:     Effort: Pulmonary effort is normal.  Skin:    General: Skin is warm and dry.  Neurological:     Mental Status: He is alert and oriented to person, place, and time.  Psychiatric:        Mood and Affect: Mood normal.        Behavior: Behavior normal.        Thought Content: Thought content normal.        Judgment: Judgment normal.     BP 131/89   Pulse 86    Resp 16   Wt 190 lb (86.2 kg)   BMI 28.89 kg/m   Past Medical History:  Diagnosis Date   Abdominal aortic aneurysm (AAA) without rupture 06/11/2018   Aneurysm of common iliac artery (BILATERAL) 06/29/2023   a.) CTA abd/pelvis 06/29/2023: measuring up to 2.1 cm (RIGHT) and 1.7 cm  (LEFT)   Aortic atherosclerosis    Aortic root dilatation    Arthritis    Atherosclerosis of native arteries of extremity with intermittent claudication 03/05/2022   Cerebral microvascular disease    DDD (degenerative disc disease), lumbosacral    Diverticulosis    HFrEF (heart failure with reduced ejection fraction) (HCC)    History of 2019 novel coronavirus disease (COVID-19) 01/2021   History of kidney stones    Hyperlipemia, mixed 09/11/2018   Hypertension    Ischemic stroke (HCC) 07/08/2019   a.) MRI brain 07/08/2019: patchy cortical/subcortical acute/early subacute infarcts within the paramedian posterior RIGHT frontal lobe   Ischemic stroke (HCC) 09/24/2022   a.) MRI brain 09/24/2022: acute infarct in the posterior limb of the RIGHT internal capsule   Long-term use of aspirin  therapy    On apixaban therapy    Persistent atrial fibrillation (HCC)    a.) CHA2DS2VASc = 8 (age x2, HFrEF, HTN, CVA x 2,vascular disease, T2DM)  as of 07/24/2023; b.) rate/rhythm maintained on oral metoprolol  tartrate; chronically anticoagulated with apixaban   Pneumonia    Secondary cardiomyopathy (HCC)    Secondary erythrocytosis 09/24/2022   a.) smoking, overweight   Status post left inguinal hernia repair    Stroke [facility])    a.) noted on brain MRI 07/08/2019: chronic LEFT parietal lobe infarct; chronic lacunar infarcts within the LEFT basal ganglia, LEFT thalamus, brainstem and cerebellum   T2DM (type 2 diabetes mellitus) (HCC)    Tobacco dependence 09/11/2018   Valvular heart disease    Vitamin D deficiency     Social History   Socioeconomic History   Marital status: Married    Spouse name: Terry   Number of  children: Not on file   Years of education: Not on file   Highest education level: Not on file  Occupational History   Not on file  Tobacco Use   Smoking status: Some Days    Current packs/day: 0.50    Types: Cigarettes   Smokeless tobacco: Never  Vaping Use   Vaping status: Not on file  Substance and Sexual Activity   Alcohol use: Never   Drug use: Never   Sexual activity: Not on file  Other Topics Concern   Not on file  Social History Narrative   Not on file   Social Drivers of Health   Financial Resource Strain: Low Risk  (09/29/2022)   Received from [facility]   Overall Financial Resource Strain (CARDIA)    Difficulty of Paying Living Expenses: Not hard at all  Food Insecurity: No Food Insecurity (07/25/2023)   Hunger Vital Sign    Worried About Running Out of Food in the Last Year: Never true    Ran Out of Food in the Last Year: Never true  Transportation Needs: No Transportation Needs (07/25/2023)   PRAPARE - Administrator, Civil Service (Medical): No    Lack of Transportation (Non-Medical): No  Physical Activity: Unknown (07/18/2019)   Received from [facility]   Exercise Vital Sign    On average, how many days per week do you engage in moderate to strenuous exercise (like a brisk walk)?: 0 days    Minutes of Exercise per Session: Not on file  Stress: No Stress Concern Present (07/18/2019)   Received from [facility] - Occupational Stress Questionnaire    Feeling of Stress : Not at all  Social Connections: Socially Integrated (07/25/2023)   Social Connection and Isolation Panel    Frequency of Communication with Friends and Family: More than three times a week    Frequency of Social Gatherings with Friends and Family: Three times a week    Attends Religious Services: 1 to 4 times per year    Active Member of Clubs or Organizations: No    Attends Tax Inspector Meetings: 1 to 4 times per year    Marital Status: Married  Catering Manager Violence: Not At Risk (07/25/2023)   Humiliation, Afraid, Rape, and Kick questionnaire    Fear of Current or Ex-Partner: No    Emotionally Abused: No    Physically Abused: No    Sexually Abused: No    Past Surgical History:  Procedure Laterality Date   CYSTOSCOPY/URETEROSCOPY/HOLMIUM LASER/STENT PLACEMENT Left 09/16/2018   Procedure: CYSTOSCOPY/URETEROSCOPY/HOLMIUM LASER/STENT PLACEMENT, LEFT;  Surgeon: Penne Knee, MD;  Location: [facility] ORS;  Service: Urology;  Laterality: Left;   ENDOVASCULAR STENT  GRAFT (AAA) N/A 07/25/2023   Procedure: ENDOVASCULAR REPAIR/STENT GRAFT;  Surgeon: Jama Cordella MATSU, MD;  Location: [facility] INVASIVE CV [facility];  Service: Cardiovascular;  Laterality: N/A;   INGUINAL HERNIA REPAIR Left 09/16/2018   Procedure: HERNIA REPAIR INGUINAL ADULT;  Surgeon: Rodolph Romano, MD;  Location: [facility] ORS;  Service: General;  Laterality: Left;    History reviewed. No pertinent family history.  No Known Allergies     Latest Ref Rng & Units 07/26/2023    2:17 AM 09/24/2022   10:41 AM 07/08/2019   10:58 AM  CBC  WBC 4.0 - 10.5 K/uL 12.1  7.0  6.4   Hemoglobin 13.0 - 17.0 g/dL 83.8  81.5  83.6   Hematocrit 39.0 - 52.0 % 48.4  55.4  47.5   Platelets 150 - 400 K/uL 118  142  151       CMP     Component Value Date/Time   NA 136 07/26/2023 0217   K 4.4 07/26/2023 0217   CL 107 07/26/2023 0217   CO2 22 07/26/2023 0217   GLUCOSE 169 (H) 07/26/2023 0217   BUN 21 07/26/2023 0217   CREATININE 1.07 07/26/2023 0217   CALCIUM  8.0 (L) 07/26/2023 0217   PROT 7.9 09/24/2022 1041   ALBUMIN 4.0 09/24/2022 1041   AST 19 09/24/2022 1041   ALT 11 09/24/2022 1041   ALKPHOS 57 09/24/2022 1041   BILITOT 1.0 09/24/2022 1041   GFRNONAA >60 07/26/2023 0217     No results found.     Assessment & Plan:   1. Infrarenal abdominal aortic aneurysm (AAA) without rupture (HCC)  (Primary) Recommend:  Patient is status post successful endovascular repair of the AAA.   No further intervention is required at this time.   No endoleak is detected and the aneurysm sac is stable.  The patient will continue antiplatelet therapy as prescribed as well as aggressive management of hyperlipidemia. Exercise is encouraged.   However, endografts require continued surveillance with ultrasound or CT scan. This is mandatory to detect any changes that allow repressurization of the aneurysm sac.  The patient is informed that this would be asymptomatic.  The patient is reminded that lifelong routine surveillance is a necessity with an endograft. Patient will continue to follow-up at the specified interval with ultrasound of the aorta.    2. Benign essential hypertension Continue antihypertensive medications as already ordered, these medications have been reviewed and there are no changes at this time.  3. Hyperlipemia, mixed Continue statin as ordered and reviewed, no changes at this time  Current Outpatient Medications on File Prior to Visit  Medication Sig Dispense Refill   apixaban (ELIQUIS) 5 MG TABS tablet Take 5 mg by mouth 2 (two) times daily.     aspirin  EC 81 MG tablet Take 81 mg by mouth daily. Swallow whole.     Cholecalciferol (VITAMIN D-3 PO) Take by mouth daily at 6 (six) AM.     furosemide (LASIX) 20 MG tablet TAKE 1 TABLET BY MOUTH EVERY DAY, TAKE 2ND DOSE IN 6 HOURS ONLY AS NEEDED     JARDIANCE 10 MG TABS tablet Take 10 mg by mouth daily.     lisinopril  (ZESTRIL ) 5 MG tablet Take 10 mg by mouth every evening.     loratadine  (CLARITIN ) 10 MG tablet Take 10 mg by mouth daily.     metoprolol  tartrate (LOPRESSOR ) 50 MG tablet Take 50 mg by mouth 2 (two) times daily.     Omega-3 Fatty Acids (FISH OIL) 1200  MG CAPS Take 1,200 mg by mouth every evening.     rosuvastatin  (CRESTOR ) 40 MG tablet Take 40 mg by mouth daily with supper.     No current facility-administered  medications on file prior to visit.    There are no Patient Instructions on file for this visit. No follow-ups on file.   Carleta Woodrow E Daryle Amis, NP

## 2024-06-02 ENCOUNTER — Ambulatory Visit (INDEPENDENT_AMBULATORY_CARE_PROVIDER_SITE_OTHER): Admitting: Nurse Practitioner

## 2024-06-02 ENCOUNTER — Other Ambulatory Visit (INDEPENDENT_AMBULATORY_CARE_PROVIDER_SITE_OTHER)
